# Patient Record
Sex: Male | Born: 1970 | Race: Black or African American | Hispanic: No | Marital: Single | Smoking: Never smoker
Health system: Southern US, Community
[De-identification: ages and names within clinical notes are randomized; demographics above are authoritative.]

## PROBLEM LIST (undated history)

## (undated) DIAGNOSIS — T7840XA Allergy, unspecified, initial encounter: Secondary | ICD-10-CM

## (undated) DIAGNOSIS — E785 Hyperlipidemia, unspecified: Secondary | ICD-10-CM

## (undated) DIAGNOSIS — G709 Myoneural disorder, unspecified: Secondary | ICD-10-CM

## (undated) DIAGNOSIS — E119 Type 2 diabetes mellitus without complications: Secondary | ICD-10-CM

## (undated) DIAGNOSIS — E1142 Type 2 diabetes mellitus with diabetic polyneuropathy: Secondary | ICD-10-CM

## (undated) DIAGNOSIS — I10 Essential (primary) hypertension: Secondary | ICD-10-CM

## (undated) HISTORY — DX: Type 2 diabetes mellitus with diabetic polyneuropathy: E11.42

## (undated) HISTORY — DX: Myoneural disorder, unspecified: G70.9

## (undated) HISTORY — DX: Type 2 diabetes mellitus without complications: E11.9

## (undated) HISTORY — DX: Essential (primary) hypertension: I10

## (undated) HISTORY — DX: Hyperlipidemia, unspecified: E78.5

## (undated) HISTORY — PX: WISDOM TOOTH EXTRACTION: SHX21

## (undated) HISTORY — DX: Allergy, unspecified, initial encounter: T78.40XA

---

## 1998-11-14 ENCOUNTER — Encounter: Admission: RE | Admit: 1998-11-14 | Discharge: 1999-02-12 | Payer: Self-pay

## 1999-06-27 ENCOUNTER — Emergency Department (HOSPITAL_COMMUNITY): Admission: EM | Admit: 1999-06-27 | Discharge: 1999-06-27 | Payer: Self-pay | Admitting: Emergency Medicine

## 2002-04-29 ENCOUNTER — Encounter: Admission: RE | Admit: 2002-04-29 | Discharge: 2002-07-28 | Payer: Self-pay | Admitting: Family Medicine

## 2012-10-01 ENCOUNTER — Encounter: Payer: Self-pay | Admitting: Internal Medicine

## 2012-10-01 ENCOUNTER — Ambulatory Visit: Payer: No Typology Code available for payment source | Attending: Family Medicine | Admitting: Internal Medicine

## 2012-10-01 VITALS — BP 135/85 | HR 82 | Temp 98.9°F | Resp 16 | Ht 73.0 in | Wt 305.0 lb

## 2012-10-01 DIAGNOSIS — E785 Hyperlipidemia, unspecified: Secondary | ICD-10-CM

## 2012-10-01 DIAGNOSIS — E119 Type 2 diabetes mellitus without complications: Secondary | ICD-10-CM

## 2012-10-01 DIAGNOSIS — I10 Essential (primary) hypertension: Secondary | ICD-10-CM

## 2012-10-01 LAB — LIPID PANEL
HDL: 31 mg/dL — ABNORMAL LOW (ref >39)
LDL Cholesterol: 75 mg/dL (ref 0–99)
Total CHOL/HDL Ratio: 4 Ratio
Triglycerides: 92 mg/dL (ref <150)
VLDL: 18 mg/dL (ref 0–40)

## 2012-10-01 MED ORDER — METFORMIN HCL 1000 MG PO TABS: 1000.0000 mg | ORAL_TABLET | Freq: Two times a day (BID) | ORAL | Status: DC

## 2012-10-01 MED ORDER — LISINOPRIL-HYDROCHLOROTHIAZIDE 20-12.5 MG PO TABS: 1.0000 | ORAL_TABLET | Freq: Every day | ORAL | Status: DC

## 2012-10-01 MED ORDER — GLIPIZIDE ER 10 MG PO TB24: 10.0000 mg | ORAL_TABLET | Freq: Every day | ORAL | Status: DC

## 2012-10-01 MED ORDER — AMLODIPINE BESYLATE 10 MG PO TABS: 10.0000 mg | ORAL_TABLET | Freq: Every day | ORAL | Status: DC

## 2012-10-01 MED ORDER — SIMVASTATIN 40 MG PO TABS: 40.0000 mg | ORAL_TABLET | Freq: Every evening | ORAL | Status: DC

## 2012-10-01 NOTE — Patient Instructions (Signed)
Hypertension As your heart beats, it forces blood through your arteries. This force is your blood pressure. If the pressure is too high, it is called hypertension (HTN) or high blood pressure. HTN is dangerous because you may have it and not know it. High blood pressure may mean that your heart has to work harder to pump blood. Your arteries may be narrow or stiff. The extra work puts you at risk for heart disease, stroke, and other problems.  Blood pressure consists of two numbers, a higher number over a lower, 110/72, for example. It is stated as "110 over 72." The ideal is below 120 for the top number (systolic) and under 80 for the bottom (diastolic). Write down your blood pressure today. You should pay close attention to your blood pressure if you have certain conditions such as:  Heart failure.  Prior heart attack.  Diabetes  Chronic kidney disease.  Prior stroke.  Multiple risk factors for heart disease. To see if you have HTN, your blood pressure should be measured while you are seated with your arm held at the level of the heart. It should be measured at least twice. A one-time elevated blood pressure reading (especially in the Emergency Department) does not mean that you need treatment. There may be conditions in which the blood pressure is different between your right and left arms. It is important to see your caregiver soon for a recheck. Most people have essential hypertension which means that there is not a specific cause. This type of high blood pressure may be lowered by changing lifestyle factors such as:  Stress.  Smoking.  Lack of exercise.  Excessive weight.  Drug/tobacco/alcohol use.  Eating less salt. Most people do not have symptoms from high blood pressure until it has caused damage to the body. Effective treatment can often prevent, delay or reduce that damage. TREATMENT  When a cause has been identified, treatment for high blood pressure is directed at the  cause. There are a large number of medications to treat HTN. These fall into several categories, and your caregiver will help you select the medicines that are best for you. Medications may have side effects. You should review side effects with your caregiver. If your blood pressure stays high after you have made lifestyle changes or started on medicines,   Your medication(s) may need to be changed.  Other problems may need to be addressed.  Be certain you understand your prescriptions, and know how and when to take your medicine.  Be sure to follow up with your caregiver within the time frame advised (usually within two weeks) to have your blood pressure rechecked and to review your medications.  If you are taking more than one medicine to lower your blood pressure, make sure you know how and at what times they should be taken. Taking two medicines at the same time can result in blood pressure that is too low. SEEK IMMEDIATE MEDICAL CARE IF:  You develop a severe headache, blurred or changing vision, or confusion.  You have unusual weakness or numbness, or a faint feeling.  You have severe chest or abdominal pain, vomiting, or breathing problems. MAKE SURE YOU:   Understand these instructions.  Will watch your condition.  Will get help right away if you are not doing well or get worse. Document Released: 02/05/2005 Document Revised: 04/30/2011 Document Reviewed: 09/26/2007 [facility] Patient Information 2014 [place], [place].  Diabetes and Exercise Regular exercise is important and can help:   Control blood glucose (  sugar).  Decrease blood pressure.    Control blood lipids (cholesterol, triglycerides).  Improve overall health. BENEFITS FROM EXERCISE  Improved fitness.  Improved flexibility.  Improved endurance.  Increased bone density.  Weight control.  Increased muscle strength.  Decreased body fat.  Improvement of the body's use of insulin, a  hormone.  Increased insulin sensitivity.  Reduction of insulin needs.  Reduced stress and tension.  Helps you feel better. People with diabetes who add exercise to their lifestyle gain additional benefits, including:  Weight loss.  Reduced appetite.  Improvement of the body's use of blood glucose.  Decreased risk factors for heart disease:  Lowering of cholesterol and triglycerides.  Raising the level of good cholesterol (high-density lipoproteins, HDL).  Lowering blood sugar.  Decreased blood pressure. TYPE 1 DIABETES AND EXERCISE  Exercise will usually lower your blood glucose.  If blood glucose is greater than 240 mg/dl, check urine ketones. If ketones are present, do not exercise.  Location of the insulin injection sites may need to be adjusted with exercise. Avoid injecting insulin into areas of the body that will be exercised. For example, avoid injecting insulin into:  The arms when playing tennis.  The legs when jogging. For more information, discuss this with your caregiver.  Keep a record of:  Food intake.  Type and amount of exercise.  Expected peak times of insulin action.  Blood glucose levels. Do this before, during, and after exercise. Review your records with your caregiver. This will help you to develop guidelines for adjusting food intake and insulin amounts.  TYPE 2 DIABETES AND EXERCISE  Regular physical activity can help control blood glucose.  Exercise is important because it may:  Increase the body's sensitivity to insulin.  Improve blood glucose control.  Exercise reduces the risk of heart disease. It decreases serum cholesterol and triglycerides. It also lowers blood pressure.  Those who take insulin or oral hypoglycemic agents should watch for signs of hypoglycemia. These signs include dizziness, shaking, sweating, chills, and confusion.  Body water is lost during exercise. It must be replaced. This will help to avoid loss of  body fluids (dehydration) or heat stroke. Be sure to talk to your caregiver before starting an exercise program to make sure it is safe for you. Remember, any activity is better than none.  Document Released: 04/28/2003 Document Revised: 04/30/2011 Document Reviewed: 08/12/2008 [facility] Patient Information 2014 [place], [place].  Hypercholesterolemia High Blood Cholesterol Cholesterol is a white, waxy, fat-like protein needed by your body in small amounts. The liver makes all the cholesterol you need. It is carried from the liver by the blood through the blood vessels. Deposits (plaque) may build up on blood vessel walls. This makes the arteries narrower and stiffer. Plaque increases the risk for heart attack and stroke. You cannot feel your cholesterol level even if it is very high. The only way to know is by a blood test to check your lipid (fats) levels. Once you know your cholesterol levels, you should keep a record of the test results. Work with your caregiver to to keep your levels in the desired range. WHAT THE RESULTS MEAN:  Total cholesterol is a rough measure of all the cholesterol in your blood.  LDL is the so-called bad cholesterol. This is the type that deposits cholesterol in the walls of the arteries. You want this level to be low.  HDL is the good cholesterol because it cleans the arteries and carries the LDL away. You want this level to  be high.  Triglycerides are fat that the body can either burn for energy or store. High levels are closely linked to heart disease. DESIRED LEVELS:  Total cholesterol below 200.  LDL below 100 for people at risk, below 70 for very high risk.  HDL above 50 is good, above 60 is best.  Triglycerides below 150. HOW TO LOWER YOUR CHOLESTEROL:  Diet.  Choose fish or white meat chicken and [place], roasted or baked. Limit fatty cuts of red meat, fried foods, and processed meats, such as sausage and lunch meat.  Eat lots of fresh fruits and  vegetables. Choose whole grains, beans, pasta, potatoes and cereals.  Use only small amounts of olive, corn or canola oils. Avoid butter, mayonnaise, shortening or palm kernel oils. Avoid foods with trans-fats.  Use skim/nonfat milk and low-fat/nonfat yogurt and cheeses. Avoid whole milk, cream, ice cream, egg yolks and cheeses. Healthy desserts include angel food cake, gingersnaps, animal crackers, hard candy, popsicles, and low-fat/nonfat frozen yogurt. Avoid pastries, cakes, pies and cookies.  Exercise.  A regular program helps decrease LDL and raises HDL.  Helps with weight control.  Do things that increase your activity level like gardening, walking, or taking the stairs.  Medication.  May be prescribed by your caregiver to help lowering cholesterol and the risk for heart disease.  You may need medicine even if your levels are normal if you have several risk factors. HOME CARE INSTRUCTIONS   Follow your diet and exercise programs as suggested by your caregiver.  Take medications as directed.  Have blood work done when your caregiver feels it is necessary. MAKE SURE YOU:   Understand these instructions.  Will watch your condition.  Will get help right away if you are not doing well or get worse. Document Released: 02/05/2005 Document Revised: 04/30/2011 Document Reviewed: 07/24/2006 [facility] Patient Information 2014 [place], [place].

## 2012-10-01 NOTE — Progress Notes (Signed)
Patient ID: Anthony Vincent, male   DOB: 22-Jun-1970, 42 y.o.   MRN: [id number]  CC: To establish care  HPI: Patient is a 42 years old [place] American man here to establish care. He has no specific complaint today. He has history of diabetes mellitus, hypertension and dyslipidemia. He is on amlodipine, lisinopril-hydrochlorothiazide, metformin, glipizide and simvastatin. He is here for refill of his medications. He denies any chest, no headache. No visual impairment. He has not had his vision checked for a long time, no foot examination done. He does not smoke cigarette, and denied the use of alcohol.  No Known Allergies Past Medical History  Diagnosis Date  . Diabetes mellitus without complication   . Hyperlipidemia   . Hypertension    No current outpatient prescriptions on file prior to visit.   No current facility-administered medications on file prior to visit.   Family History  Problem Relation Age of Onset  . Diabetes Mother   . Diabetes Father    History   Social History  . Marital Status: Single    Spouse Name: N/A    Number of Children: N/A  . Years of Education: N/A   Occupational History  . Not on file.   Social History Main Topics  . Smoking status: Never Smoker   . Smokeless tobacco: Not on file  . Alcohol Use: No  . Drug Use: No  . Sexual Activity: Not on file   Other Topics Concern  . Not on file   Social History Narrative  . No narrative on file    Review of Systems: Constitutional: Negative for fever, chills, diaphoresis, activity change, appetite change and fatigue. HENT: Negative for ear pain, nosebleeds, congestion, facial swelling, rhinorrhea, neck pain, neck stiffness and ear discharge.  Eyes: Negative for pain, discharge, redness, itching and visual disturbance. Respiratory: Negative for cough, choking, chest tightness, shortness of breath, wheezing and stridor.  Cardiovascular: Negative for chest pain, palpitations and leg  swelling. Gastrointestinal: Negative for abdominal distention. Genitourinary: Negative for dysuria, urgency, frequency, hematuria, flank pain, decreased urine volume, difficulty urinating and dyspareunia.  Musculoskeletal: Negative for back pain, joint swelling, arthralgias and gait problem. Neurological: Negative for dizziness, tremors, seizures, syncope, facial asymmetry, speech difficulty, weakness, light-headedness, numbness and headaches.  Hematological: Negative for adenopathy. Does not bruise/bleed easily. Psychiatric/Behavioral: Negative for hallucinations, behavioral problems, confusion, dysphoric mood, decreased concentration and agitation.    Objective:   Filed Vitals:   10/01/12 1534  BP: 135/85  Pulse: 82  Temp: 98.9 F (37.2 C)  Resp: 16    Physical Exam: Constitutional: Patient appears well-developed and well-nourished. No distress. obese HENT: Normocephalic, atraumatic, External right and left ear normal. Oropharynx is clear and moist.  Eyes: Conjunctivae and EOM are normal. PERRLA, no scleral icterus. Neck: Normal ROM. Neck supple. No JVD. No tracheal deviation. No thyromegaly. CVS: RRR, S1/S2 +, no murmurs, no gallops, no carotid bruit.  Pulmonary: Effort and breath sounds normal, no stridor, rhonchi, wheezes, rales.  Abdominal: Soft. BS +,  no distension, tenderness, rebound or guarding.  Musculoskeletal: Normal range of motion. No edema and no tenderness.  Lymphadenopathy: No lymphadenopathy noted, cervical, inguinal or axillary Neuro: Alert. Normal reflexes, muscle tone coordination. No cranial nerve deficit. Skin: Skin is warm and dry. No rash noted. Not diaphoretic. No erythema. No pallor. Psychiatric: Normal mood and affect. Behavior, judgment, thought content normal.  No results found for this basename: WBC, HGB, HCT, MCV, PLT   No results found for this basename: CREATININE, BUN, NA,  K, CL, CO2    No results found for this basename: HGBA1C   Lipid  Panel  No results found for this basename: chol, trig, hdl, cholhdl, vldl, ldlcalc       Assessment and plan:   Patient Active Problem List   Diagnosis Date Noted  . Essential hypertension, benign 10/01/2012  . Diabetes 10/01/2012  . Dyslipidemia 10/01/2012    Amlodipine 10 mg tablet by mouth daily  Lisinopril-hydrochlorothiazide 10-12.5 mg tablet by mouth daily  Metformin 1000 mg tablet by mouth twice a day  Glipizide 10 mg tablet by mouth daily  Simvastatin 40 mg tablet by mouth daily  Patient has been counseled about nutrition and exercise  Labs today: Comprehensive metabolic panel Lipid profile CBC D. Hemoglobin A1c  Will follow lab tests and call patient with results  He will be referred to ophthalmologist for eye check And podiatrist referral for foot examination  Patient extensively counseled about blood pressure control and blood sugar control and the need for compliance with medication and followup.  Delvonte Berenson was given clear instructions to go to ER or return to the clinic if symptoms don't improve, worsen or new problems develop.  Kele Rentfrow verbalized understanding.  Cruze Zingaro was told to call to get lab results if hasn't heard anything in the next week.        Jeanann Lewandowsky, MD [facility] [place], [place] [phone]   10/01/2012, 4:09 PM

## 2012-10-01 NOTE — Progress Notes (Signed)
Patient states that he is here to establish care for treatment of diabetes and hypertension. Needs refills.

## 2012-10-02 LAB — CBC WITH DIFFERENTIAL/PLATELET
Basophils Relative: 0 % (ref 0–1)
Eosinophils Absolute: 0.1 10*3/uL (ref 0.0–0.7)
Eosinophils Relative: 2 % (ref 0–5)
Hemoglobin: 14.4 g/dL (ref 13.0–17.0)
MCH: 26.5 pg (ref 26.0–34.0)
MCHC: 34.4 g/dL (ref 30.0–36.0)
MCV: 77 fL — ABNORMAL LOW (ref 78.0–100.0)
Monocytes Relative: 7 % (ref 3–12)
Neutrophils Relative %: 58 % (ref 43–77)

## 2012-10-02 LAB — COMPLETE METABOLIC PANEL WITH GFR
ALT: 40 U/L (ref 0–53)
BUN: 9 mg/dL (ref 6–23)
CO2: 29 mEq/L (ref 19–32)
Creat: 0.95 mg/dL (ref 0.50–1.35)
GFR, Est African American: >89 mL/min
GFR, Est Non African American: >89 mL/min
Glucose, Bld: 248 mg/dL — ABNORMAL HIGH (ref 70–99)
Total Bilirubin: 1 mg/dL (ref 0.3–1.2)

## 2012-10-02 LAB — HEMOGLOBIN A1C: Hgb A1c MFr Bld: 9.4 % — ABNORMAL HIGH (ref <5.7)

## 2012-10-29 ENCOUNTER — Ambulatory Visit: Payer: No Typology Code available for payment source | Attending: Internal Medicine | Admitting: Internal Medicine

## 2012-10-29 ENCOUNTER — Encounter: Payer: Self-pay | Admitting: Internal Medicine

## 2012-10-29 VITALS — BP 128/84 | HR 93 | Temp 98.5°F | Resp 16 | Ht 73.23 in | Wt 302.0 lb

## 2012-10-29 DIAGNOSIS — E119 Type 2 diabetes mellitus without complications: Secondary | ICD-10-CM | POA: Insufficient documentation

## 2012-10-29 DIAGNOSIS — E785 Hyperlipidemia, unspecified: Secondary | ICD-10-CM | POA: Insufficient documentation

## 2012-10-29 DIAGNOSIS — I10 Essential (primary) hypertension: Secondary | ICD-10-CM | POA: Insufficient documentation

## 2012-10-29 NOTE — Patient Instructions (Signed)

## 2012-10-29 NOTE — Progress Notes (Signed)
F/U visit Pt is here for a physical

## 2012-10-29 NOTE — Progress Notes (Signed)
Patient ID: Anthony Vincent, male   DOB: 01-05-1971, 42 y.o.   MRN: [id number]  CC: Followup  HPI: 42 year old male with past medical history of diabetes, dyslipidemia and hypertension who presented to clinic for followup. Patient reports being compliant with medications. No complaints of chest pain or abdominal pain. No fevers or chills. No blurry vision or headaches.  No Known Allergies Past Medical History  Diagnosis Date  . Diabetes mellitus without complication   . Hyperlipidemia   . Hypertension    Current Outpatient Prescriptions on File Prior to Visit  Medication Sig Dispense Refill  . amLODipine (NORVASC) 10 MG tablet Take 1 tablet (10 mg total) by mouth daily.  30 tablet  2  . glipiZIDE (GLUCOTROL XL) 10 MG 24 hr tablet Take 1 tablet (10 mg total) by mouth daily.  30 tablet  2  . lisinopril-hydrochlorothiazide (PRINZIDE,ZESTORETIC) 20-12.5 MG per tablet Take 1 tablet by mouth daily.  30 tablet  2  . metFORMIN (GLUCOPHAGE) 1000 MG tablet Take 1 tablet (1,000 mg total) by mouth 2 (two) times daily with a meal.  60 tablet  2  . simvastatin (ZOCOR) 40 MG tablet Take 1 tablet (40 mg total) by mouth every evening.  30 tablet  2   No current facility-administered medications on file prior to visit.   Family History  Problem Relation Age of Onset  . Diabetes Mother   . Diabetes Father    History   Social History  . Marital Status: Single    Spouse Name: N/A    Number of Children: N/A  . Years of Education: N/A   Occupational History  . Not on file.   Social History Main Topics  . Smoking status: Never Smoker   . Smokeless tobacco: Not on file  . Alcohol Use: No  . Drug Use: No  . Sexual Activity: Not on file   Other Topics Concern  . Not on file   Social History Narrative  . No narrative on file    Review of Systems  Constitutional: Negative for fever, chills, diaphoresis, activity change, appetite change and fatigue.  HENT: Negative for ear pain, nosebleeds,  congestion, facial swelling, rhinorrhea, neck pain, neck stiffness and ear discharge.   Eyes: Negative for pain, discharge, redness, itching and visual disturbance.  Respiratory: Negative for cough, choking, chest tightness, shortness of breath, wheezing and stridor.   Cardiovascular: Negative for chest pain, palpitations and leg swelling.  Gastrointestinal: Negative for abdominal distention.  Genitourinary: Negative for dysuria, urgency, frequency, hematuria, flank pain, decreased urine volume, difficulty urinating and dyspareunia.  Musculoskeletal: Negative for back pain, joint swelling, arthralgias and gait problem.  Neurological: Negative for dizziness, tremors, seizures, syncope, facial asymmetry, speech difficulty, weakness, light-headedness, numbness and headaches.  Hematological: Negative for adenopathy. Does not bruise/bleed easily.  Psychiatric/Behavioral: Negative for hallucinations, behavioral problems, confusion, dysphoric mood, decreased concentration and agitation.    Objective:   Filed Vitals:   10/29/12 1413  BP: 128/84  Pulse: 93  Temp: 98.5 F (36.9 C)  Resp: 16    Physical Exam  Constitutional: Appears well-developed and well-nourished. No distress.  HENT: Normocephalic. External right and left ear normal. Oropharynx is clear and moist.  Eyes: Conjunctivae and EOM are normal. PERRLA, no scleral icterus.  Neck: Normal ROM. Neck supple. No JVD. No tracheal deviation. No thyromegaly.  CVS: RRR, S1/S2 +, no murmurs, no gallops, no carotid bruit.  Pulmonary: Effort and breath sounds normal, no stridor, rhonchi, wheezes, rales.  Abdominal: Soft. BS +,  no distension, tenderness, rebound or guarding.  Musculoskeletal: Normal range of motion. No edema and no tenderness.  Lymphadenopathy: No lymphadenopathy noted, cervical, inguinal. Neuro: Alert. Normal reflexes, muscle tone coordination. No cranial nerve deficit. Skin: Skin is warm and dry. No rash noted. Not  diaphoretic. No erythema. No pallor.  Psychiatric: Normal mood and affect. Behavior, judgment, thought content normal.   Lab Results  Component Value Date   WBC 7.7 10/01/2012   HGB 14.4 10/01/2012   HCT 41.8 10/01/2012   MCV 77.0* 10/01/2012   PLT 230 10/01/2012   Lab Results  Component Value Date   CREATININE 0.95 10/01/2012   BUN 9 10/01/2012   NA 137 10/01/2012   K 4.2 10/01/2012   CL 101 10/01/2012   CO2 29 10/01/2012    Lab Results  Component Value Date   HGBA1C 9.4* 10/01/2012   Lipid Panel     Component Value Date/Time   CHOL 124 10/01/2012 1612   TRIG 92 10/01/2012 1612   HDL 31* 10/01/2012 1612   CHOLHDL 4.0 10/01/2012 1612   VLDL 18 10/01/2012 1612   LDLCALC 75 10/01/2012 1612       Assessment and plan:   Patient Active Problem List   Diagnosis Date Noted  . Essential hypertension, benign 10/01/2012    Priority: Medium - We have discussed target BP range - I have advised pt to check BP regularly and to call us back if the numbers are higher than 140/90 - discussed the importance of compliance with medical therapy and diet  - Blood pressure well-controlled this visit  - Continue Norvasc 10 mg daily and continue Prinzide daily   . Diabetes 10/01/2012    Priority: Medium - Uncontrolled, A1c in August 2014 at 9.4 indicating poor glycemic control  - Continue metformin and glipizide as prescribed and we will recheck A1c in November 2014   . Dyslipidemia 10/01/2012    Priority: Medium - Continue simvastatin 40 mg at bedtime

## 2012-11-19 ENCOUNTER — Encounter (HOSPITAL_COMMUNITY): Payer: Self-pay

## 2012-11-19 ENCOUNTER — Emergency Department (HOSPITAL_COMMUNITY)
Admission: EM | Admit: 2012-11-19 | Discharge: 2012-11-19 | Disposition: A | Discharge status: Home / Self Care | Payer: No Typology Code available for payment source | Source: Home / Self Care | Attending: Family Medicine | Admitting: Family Medicine

## 2012-11-19 DIAGNOSIS — L0291 Cutaneous abscess, unspecified: Secondary | ICD-10-CM

## 2012-11-19 MED ORDER — DOXYCYCLINE HYCLATE 100 MG PO CAPS: 100.0000 mg | ORAL_CAPSULE | Freq: Two times a day (BID) | ORAL | Status: DC

## 2012-11-19 MED ORDER — CEFTRIAXONE SODIUM 1 G IJ SOLR
INTRAMUSCULAR | Status: AC
  Filled 2012-11-19: qty 10

## 2012-11-19 MED ORDER — METHYLPREDNISOLONE SODIUM SUCC 125 MG IJ SOLR
INTRAMUSCULAR | Status: AC
  Filled 2012-11-19: qty 2

## 2012-11-19 MED ORDER — CEFTRIAXONE SODIUM 1 G IJ SOLR
1.0000 g | INTRAMUSCULAR | Status: AC
  Administered 2012-11-19: 1 g via INTRAMUSCULAR

## 2012-11-19 MED ORDER — METHYLPREDNISOLONE SODIUM SUCC 125 MG IJ SOLR
60.0000 mg | Freq: Once | INTRAMUSCULAR | Status: AC
  Administered 2012-11-19: 60 mg via INTRAMUSCULAR

## 2012-11-19 MED ORDER — LIDOCAINE HCL (PF) 1 % IJ SOLN
INTRAMUSCULAR | Status: AC
  Filled 2012-11-19: qty 5

## 2012-11-19 NOTE — ED Provider Notes (Signed)
CSN: [id number]     Arrival date & time 11/19/12  1733 History   First MD Initiated Contact with Patient 11/19/12 1855     Chief Complaint  Patient presents with  . Recurrent Skin Infections   (Consider location/radiation/quality/duration/timing/severity/associated sxs/prior Treatment) HPI Comments: 42 yo male diabetic presents with sore on left cheek since Saturday and sore on chest since Sunday. Patient notes has had same type of sore in the past and resolves with antibiotics. Notes mild throat irritation/ nodes feel swollen x 1 day. Has been cleaning area with alcohol and coating with neosporin. Pt does not check BS regularly. Denies SOB/ CP/ vision changes.   Past Medical History  Diagnosis Date  . Diabetes mellitus without complication   . Hyperlipidemia   . Hypertension    History reviewed. No pertinent past surgical history. Family History  Problem Relation Age of Onset  . Diabetes Mother   . Diabetes Father    History  Substance Use Topics  . Smoking status: Never Smoker   . Smokeless tobacco: Not on file  . Alcohol Use: No    Review of Systems  Constitutional: Negative.   HENT: Positive for sore throat and facial swelling.   Eyes: Negative.   Respiratory: Negative.   Cardiovascular: Negative.   Musculoskeletal: Negative.   Skin: Positive for rash.  Neurological: Negative.   Psychiatric/Behavioral: Negative.     Allergies  Review of patient's allergies indicates no known allergies.  Home Medications   Current Outpatient Rx  Name  Route  Sig  Dispense  Refill  . amLODipine (NORVASC) 10 MG tablet   Oral   Take 1 tablet (10 mg total) by mouth daily.   30 tablet   2   . doxycycline (VIBRAMYCIN) 100 MG capsule   Oral   Take 1 capsule (100 mg total) by mouth 2 (two) times daily.   20 capsule   0   . glipiZIDE (GLUCOTROL XL) 10 MG 24 hr tablet   Oral   Take 1 tablet (10 mg total) by mouth daily.   30 tablet   2   . lisinopril-hydrochlorothiazide  (PRINZIDE,ZESTORETIC) 20-12.5 MG per tablet   Oral   Take 1 tablet by mouth daily.   30 tablet   2   . metFORMIN (GLUCOPHAGE) 1000 MG tablet   Oral   Take 1 tablet (1,000 mg total) by mouth 2 (two) times daily with a meal.   60 tablet   2   . simvastatin (ZOCOR) 40 MG tablet   Oral   Take 1 tablet (40 mg total) by mouth every evening.   30 tablet   2    BP 127/79  Pulse 104  Temp(Src) 99.3 F (37.4 C) (Oral)  Resp 19  SpO2 97% Physical Exam  Nursing note and vitals reviewed. Constitutional: He is oriented to person, place, and time. He appears well-developed and well-nourished.  HENT:  Head: Normocephalic and atraumatic.  Right Ear: External ear normal.  Left Ear: External ear normal.  Nose: Nose normal.  Mouth/Throat: Oropharynx is clear and moist.  Eyes: Conjunctivae and EOM are normal. Pupils are equal, round, and reactive to light.  Neck: Neck supple.  Cardiovascular: Normal rate, regular rhythm, normal heart sounds and intact distal pulses.   Pulmonary/Chest: Effort normal and breath sounds normal.  Musculoskeletal: Normal range of motion.  Neurological: He is alert and oriented to person, place, and time. He has normal reflexes.  Skin: Skin is warm.  Left cheek with quarter size erythema  with central 1-2 mm yellow/ white pustules, with mild fullness Center chest with quarter size erythema with multiple 1-2 mm pustules centrally with area of fullness approximately 1cm wider on all edges of erythema  Psychiatric: He has a normal mood and affect. Judgment normal.    ED Course  Procedures (including critical care time) Labs Review Labs Reviewed - No data to display Imaging Review No results found.  MDM  Abscess chest and face. Solumedrol 60mg  IM Rocephin 1 gm Doxycycline 100mg  1 bid # 20 Hygiene explained to patient. Patient will go to ER if symptoms increase. Patient will take extra 1/2 of Glipizide if BS above 200    Berenice Primas, [place] 11/19/12  1922

## 2012-11-19 NOTE — ED Notes (Signed)
C/o has an inflamed area on face and on his chest for past few days; reportedly affects his swallowing 2nd to swelling

## 2012-11-19 NOTE — ED Notes (Signed)
rocepin mixed in lidocaine for comfort

## 2012-11-21 NOTE — ED Provider Notes (Signed)
Medical screening examination/treatment/procedure(s) were performed by resident physician or non-physician practitioner and as supervising physician I was immediately available for consultation/collaboration.   Luan Urbani DOUGLAS MD.   Ismael Treptow D Morgen Ritacco, MD 11/21/12 0912 

## 2013-01-07 ENCOUNTER — Ambulatory Visit: Payer: No Typology Code available for payment source | Admitting: Internal Medicine

## 2013-02-02 ENCOUNTER — Encounter: Payer: Self-pay | Admitting: Internal Medicine

## 2013-02-02 ENCOUNTER — Ambulatory Visit: Payer: No Typology Code available for payment source | Attending: Internal Medicine | Admitting: Internal Medicine

## 2013-02-02 VITALS — BP 141/82 | HR 102 | Temp 98.7°F | Resp 15 | Ht 72.0 in | Wt 302.8 lb

## 2013-02-02 DIAGNOSIS — E119 Type 2 diabetes mellitus without complications: Secondary | ICD-10-CM | POA: Insufficient documentation

## 2013-02-02 DIAGNOSIS — E1310 Other specified diabetes mellitus with ketoacidosis without coma: Secondary | ICD-10-CM

## 2013-02-02 DIAGNOSIS — E1110 Type 2 diabetes mellitus with ketoacidosis without coma: Secondary | ICD-10-CM

## 2013-02-02 DIAGNOSIS — I10 Essential (primary) hypertension: Secondary | ICD-10-CM | POA: Insufficient documentation

## 2013-02-02 DIAGNOSIS — E785 Hyperlipidemia, unspecified: Secondary | ICD-10-CM

## 2013-02-02 LAB — COMPLETE METABOLIC PANEL WITH GFR
Alkaline Phosphatase: 81 U/L (ref 39–117)
BUN: 11 mg/dL (ref 6–23)
CO2: 27 mEq/L (ref 19–32)
GFR, Est African American: >89 mL/min
GFR, Est Non African American: 83 mL/min
Glucose, Bld: 406 mg/dL — ABNORMAL HIGH (ref 70–99)
Sodium: 136 mEq/L (ref 135–145)
Total Bilirubin: 1.1 mg/dL (ref 0.3–1.2)
Total Protein: 7.3 g/dL (ref 6.0–8.3)

## 2013-02-02 LAB — GLUCOSE, POCT (MANUAL RESULT ENTRY): POC Glucose: 380 mg/dl — AB (ref 70–99)

## 2013-02-02 LAB — CBC WITH DIFFERENTIAL/PLATELET
Basophils Absolute: 0 10*3/uL (ref 0.0–0.1)
Eosinophils Absolute: 0.1 10*3/uL (ref 0.0–0.7)
Eosinophils Relative: 2 % (ref 0–5)
HCT: 45.1 % (ref 39.0–52.0)
Hemoglobin: 15.5 g/dL (ref 13.0–17.0)
Lymphs Abs: 2.1 10*3/uL (ref 0.7–4.0)
MCH: 27.2 pg (ref 26.0–34.0)
MCV: 79.1 fL (ref 78.0–100.0)
Monocytes Absolute: 0.6 10*3/uL (ref 0.1–1.0)
Monocytes Relative: 8 % (ref 3–12)
Neutro Abs: 5 10*3/uL (ref 1.7–7.7)
Neutrophils Relative %: 63 % (ref 43–77)
RBC: 5.7 MIL/uL (ref 4.22–5.81)

## 2013-02-02 LAB — LIPID PANEL
Cholesterol: 138 mg/dL (ref 0–200)
HDL: 28 mg/dL — ABNORMAL LOW (ref >39)
LDL Cholesterol: 85 mg/dL (ref 0–99)

## 2013-02-02 MED ORDER — AMLODIPINE BESYLATE 10 MG PO TABS: 10.0000 mg | ORAL_TABLET | Freq: Every day | ORAL | Status: DC

## 2013-02-02 MED ORDER — METFORMIN HCL 1000 MG PO TABS: 1000.0000 mg | ORAL_TABLET | Freq: Two times a day (BID) | ORAL | Status: DC

## 2013-02-02 MED ORDER — SIMVASTATIN 40 MG PO TABS: 40.0000 mg | ORAL_TABLET | Freq: Every evening | ORAL | Status: DC

## 2013-02-02 MED ORDER — LISINOPRIL-HYDROCHLOROTHIAZIDE 20-12.5 MG PO TABS: 1.0000 | ORAL_TABLET | Freq: Every day | ORAL | Status: DC

## 2013-02-02 MED ORDER — GLIPIZIDE ER 10 MG PO TB24: 10.0000 mg | ORAL_TABLET | Freq: Every day | ORAL | Status: DC

## 2013-02-02 NOTE — Addendum Note (Signed)
Addended by: Susie Cassette MD, Germain Osgood on: 02/02/2013 03:33 PM   Modules accepted: Orders

## 2013-02-02 NOTE — Progress Notes (Signed)
Pt is here for a diabetes check up.

## 2013-02-02 NOTE — Progress Notes (Signed)
Patient ID: Anthony Vincent, male   DOB: January 06, 1971, 42 y.o.   MRN: [id number]   CC:  HPI: 42 year-old male here for diabetes check up. Last hemoglobin A1c was around 9.4. The patient states that her CBGs have been running in the 200s. He denies any recent weight loss. Denies any chest pain shortness of breath orthopnea. He is motivated to lose weight    No Known Allergies Past Medical History  Diagnosis Date  . Diabetes mellitus without complication   . Hyperlipidemia   . Hypertension    Current Outpatient Prescriptions on File Prior to Visit  Medication Sig Dispense Refill  . amLODipine (NORVASC) 10 MG tablet Take 1 tablet (10 mg total) by mouth daily.  30 tablet  2  . doxycycline (VIBRAMYCIN) 100 MG capsule Take 1 capsule (100 mg total) by mouth 2 (two) times daily.  20 capsule  0  . glipiZIDE (GLUCOTROL XL) 10 MG 24 hr tablet Take 1 tablet (10 mg total) by mouth daily.  30 tablet  2  . lisinopril-hydrochlorothiazide (PRINZIDE,ZESTORETIC) 20-12.5 MG per tablet Take 1 tablet by mouth daily.  30 tablet  2  . metFORMIN (GLUCOPHAGE) 1000 MG tablet Take 1 tablet (1,000 mg total) by mouth 2 (two) times daily with a meal.  60 tablet  2  . simvastatin (ZOCOR) 40 MG tablet Take 1 tablet (40 mg total) by mouth every evening.  30 tablet  2   No current facility-administered medications on file prior to visit.   Family History  Problem Relation Age of Onset  . Diabetes Mother   . Diabetes Father    History   Social History  . Marital Status: Single    Spouse Name: N/A    Number of Children: N/A  . Years of Education: N/A   Occupational History  . Not on file.   Social History Main Topics  . Smoking status: Never Smoker   . Smokeless tobacco: Not on file  . Alcohol Use: No  . Drug Use: No  . Sexual Activity: Not on file   Other Topics Concern  . Not on file   Social History Narrative  . No narrative on file    Review of Systems  Constitutional: Negative for fever,  chills, diaphoresis, activity change, appetite change and fatigue.  HENT: Negative for ear pain, nosebleeds, congestion, facial swelling, rhinorrhea, neck pain, neck stiffness and ear discharge.   Eyes: Negative for pain, discharge, redness, itching and visual disturbance.  Respiratory: Negative for cough, choking, chest tightness, shortness of breath, wheezing and stridor.   Cardiovascular: Negative for chest pain, palpitations and leg swelling.  Gastrointestinal: Negative for abdominal distention.  Genitourinary: Negative for dysuria, urgency, frequency, hematuria, flank pain, decreased urine volume, difficulty urinating and dyspareunia.  Musculoskeletal: Negative for back pain, joint swelling, arthralgias and gait problem.  Neurological: Negative for dizziness, tremors, seizures, syncope, facial asymmetry, speech difficulty, weakness, light-headedness, numbness and headaches.  Hematological: Negative for adenopathy. Does not bruise/bleed easily.  Psychiatric/Behavioral: Negative for hallucinations, behavioral problems, confusion, dysphoric mood, decreased concentration and agitation.    Objective:   Filed Vitals:   02/02/13 1456  BP: 141/82  Pulse: 102  Temp: 98.7 F (37.1 C)  Resp: 15    Physical Exam  Constitutional: Appears well-developed and well-nourished. No distress.  HENT: Normocephalic. External right and left ear normal. Oropharynx is clear and moist.  Eyes: Conjunctivae and EOM are normal. PERRLA, no scleral icterus.  Neck: Normal ROM. Neck supple. No JVD. No tracheal deviation.  No thyromegaly.  CVS: RRR, S1/S2 +, no murmurs, no gallops, no carotid bruit.  Pulmonary: Effort and breath sounds normal, no stridor, rhonchi, wheezes, rales.  Abdominal: Soft. BS +,  no distension, tenderness, rebound or guarding.  Musculoskeletal: Normal range of motion. No edema and no tenderness.  Lymphadenopathy: No lymphadenopathy noted, cervical, inguinal. Neuro: Alert. Normal reflexes,  muscle tone coordination. No cranial nerve deficit. Skin: Skin is warm and dry. No rash noted. Not diaphoretic. No erythema. No pallor.  Psychiatric: Normal mood and affect. Behavior, judgment, thought content normal.   Lab Results  Component Value Date   WBC 7.7 10/01/2012   HGB 14.4 10/01/2012   HCT 41.8 10/01/2012   MCV 77.0* 10/01/2012   PLT 230 10/01/2012   Lab Results  Component Value Date   CREATININE 0.95 10/01/2012   BUN 9 10/01/2012   NA 137 10/01/2012   K 4.2 10/01/2012   CL 101 10/01/2012   CO2 29 10/01/2012    Lab Results  Component Value Date   HGBA1C 9.4* 10/01/2012   Lipid Panel     Component Value Date/Time   CHOL 124 10/01/2012 1612   TRIG 92 10/01/2012 1612   HDL 31* 10/01/2012 1612   CHOLHDL 4.0 10/01/2012 1612   VLDL 18 10/01/2012 1612   LDLCALC 75 10/01/2012 1612       Assessment and plan:   Patient Active Problem List   Diagnosis Date Noted  . Essential hypertension, benign 10/01/2012  . Diabetes 10/01/2012  . Dyslipidemia 10/01/2012   Hypertension Control continue current medical regimen We'll check creatinine  Diabetes mellitus type 2, A1c pending Patient has maxed out on oral regimen Offered once a day Lantus at bedtime his A1c was still around 9 Patient refuses to start insulin, wants to lose weight and see what his A1c in about 3 months    Ophthalmology referral provided as you've not had an eye exam in 3 years  Follow up in 2 months    The patient was given clear instructions to go to ER or return to medical center if symptoms don't improve, worsen or new problems develop. The patient verbalized understanding. The patient was told to call to get any lab results if not heard anything in the next week.

## 2013-02-02 NOTE — Addendum Note (Signed)
Addended by: Susie Cassette MD, Germain Osgood on: 02/02/2013 03:51 PM   Modules accepted: Orders

## 2013-02-02 NOTE — Addendum Note (Signed)
Addended by: Susie Cassette MD, Germain Osgood on: 02/02/2013 03:48 PM   Modules accepted: Orders

## 2013-02-16 ENCOUNTER — Telehealth: Payer: Self-pay | Admitting: Emergency Medicine

## 2013-02-16 NOTE — Telephone Encounter (Signed)
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 Pain has worsened

## 2013-03-10 ENCOUNTER — Telehealth: Payer: Self-pay | Admitting: Emergency Medicine

## 2013-03-10 ENCOUNTER — Telehealth: Payer: Self-pay | Admitting: Internal Medicine

## 2013-03-10 MED ORDER — GABAPENTIN 100 MG PO CAPS: 100.0000 mg | ORAL_CAPSULE | Freq: Three times a day (TID) | ORAL | Status: DC

## 2013-03-10 NOTE — Telephone Encounter (Signed)
Spoke with pt regarding script for neuropathy. Script Gabapentin 100 mg tid e-scribed to [facility]. Pt verbalized understanding

## 2013-03-10 NOTE — Telephone Encounter (Signed)
Pt is calling in today to check the status of his appt and to speak with a nurse about some pain in his hands; Pt mentioned that he called a few weeks ago to inquire about some medication to help relieve the pain; pt says that pain is mild but noticeable; please follow up with pt @ [phone]

## 2013-04-06 ENCOUNTER — Ambulatory Visit: Payer: No Typology Code available for payment source | Attending: Internal Medicine | Admitting: Internal Medicine

## 2013-04-06 ENCOUNTER — Encounter: Payer: Self-pay | Admitting: Internal Medicine

## 2013-04-06 VITALS — BP 133/92 | HR 87 | Temp 98.4°F | Resp 14 | Ht 72.0 in | Wt 294.0 lb

## 2013-04-06 DIAGNOSIS — E785 Hyperlipidemia, unspecified: Secondary | ICD-10-CM

## 2013-04-06 DIAGNOSIS — I10 Essential (primary) hypertension: Secondary | ICD-10-CM

## 2013-04-06 DIAGNOSIS — G609 Hereditary and idiopathic neuropathy, unspecified: Secondary | ICD-10-CM

## 2013-04-06 DIAGNOSIS — E119 Type 2 diabetes mellitus without complications: Secondary | ICD-10-CM

## 2013-04-06 MED ORDER — LISINOPRIL-HYDROCHLOROTHIAZIDE 20-12.5 MG PO TABS: 1.0000 | ORAL_TABLET | Freq: Every day | ORAL | Status: DC

## 2013-04-06 MED ORDER — SIMVASTATIN 40 MG PO TABS: 40.0000 mg | ORAL_TABLET | Freq: Every evening | ORAL | Status: DC

## 2013-04-06 MED ORDER — AMLODIPINE BESYLATE 10 MG PO TABS: 10.0000 mg | ORAL_TABLET | Freq: Every day | ORAL | Status: DC

## 2013-04-06 MED ORDER — GABAPENTIN 100 MG PO CAPS: 100.0000 mg | ORAL_CAPSULE | Freq: Three times a day (TID) | ORAL | Status: DC

## 2013-04-06 MED ORDER — GLIPIZIDE ER 10 MG PO TB24: 10.0000 mg | ORAL_TABLET | Freq: Every day | ORAL | Status: DC

## 2013-04-06 MED ORDER — METFORMIN HCL 1000 MG PO TABS: 1000.0000 mg | ORAL_TABLET | Freq: Two times a day (BID) | ORAL | Status: DC

## 2013-04-06 NOTE — Progress Notes (Unsigned)
Patient ID: Anthony Vincent, male   DOB: [birth year], 43 y.o.   MRN: [id number]   CC:  HPI: 43 year old male show the chief complaint of numbness in the dull ache in the second and third digit of both his hands. He denies any neck pain denies any shoulder pain, no pain radiating down his arms. Pain is worse with any use of his hands. Currently he works as a Clinical biochemist. Pain is also worse when he wakes up in the morning. Pain has been present since December last year. Somewhat improved since he started on gabapentin 100 mg 3 times a day.  His diabetes is under good control her CBGs have been from 150-220. His last A1c was greater than 9.0    No Known Allergies Past Medical History  Diagnosis Date  . Diabetes mellitus without complication   . Hyperlipidemia   . Hypertension    Current Outpatient Prescriptions on File Prior to Visit  Medication Sig Dispense Refill  . doxycycline (VIBRAMYCIN) 100 MG capsule Take 1 capsule (100 mg total) by mouth 2 (two) times daily.  20 capsule  0   No current facility-administered medications on file prior to visit.   Family History  Problem Relation Age of Onset  . Diabetes Mother   . Diabetes Father    History   Social History  . Marital Status: Single    Spouse Name: N/A    Number of Children: N/A  . Years of Education: N/A   Occupational History  . Not on file.   Social History Main Topics  . Smoking status: Never Smoker   . Smokeless tobacco: Not on file  . Alcohol Use: No  . Drug Use: No  . Sexual Activity: Not on file   Other Topics Concern  . Not on file   Social History Narrative  . No narrative on file    Review of Systems  Constitutional: Negative for fever, chills, diaphoresis, activity change, appetite change and fatigue.  HENT: Negative for ear pain, nosebleeds, congestion, facial swelling, rhinorrhea, neck pain, neck stiffness and ear discharge.   Eyes: Negative for pain, discharge, redness, itching and visual  disturbance.  Respiratory: Negative for cough, choking, chest tightness, shortness of breath, wheezing and stridor.   Cardiovascular: Negative for chest pain, palpitations and leg swelling.  Gastrointestinal: Negative for abdominal distention.  Genitourinary: Negative for dysuria, urgency, frequency, hematuria, flank pain, decreased urine volume, difficulty urinating and dyspareunia.  Musculoskeletal: As in history of present illness Neurological: Negative for dizziness, tremors, seizures, syncope, facial asymmetry, speech difficulty, weakness, light-headedness, numbness and headaches.  Hematological: Negative for adenopathy. Does not bruise/bleed easily.  Psychiatric/Behavioral: Negative for hallucinations, behavioral problems, confusion, dysphoric mood, decreased concentration and agitation.    Objective:   Filed Vitals:   04/06/13 1332  BP: 133/92  Pulse: 87  Temp: 98.4 F (36.9 C)  Resp: 14    Physical Exam  Constitutional: Appears well-developed and well-nourished. No distress.  HENT: Normocephalic. External right and left ear normal. Oropharynx is clear and moist.  Eyes: Conjunctivae and EOM are normal. PERRLA, no scleral icterus.  Neck: Normal ROM. Neck supple. No JVD. No tracheal deviation. No thyromegaly.  CVS: RRR, S1/S2 +, no murmurs, no gallops, no carotid bruit.  Pulmonary: Effort and breath sounds normal, no stridor, rhonchi, wheezes, rales.  Abdominal: Soft. BS +,  no distension, tenderness, rebound or guarding.  Musculoskeletal: Normal range of motion. No edema and no tenderness.  Lymphadenopathy: No lymphadenopathy noted, cervical, inguinal. Neuro: Alert. Normal reflexes,  muscle tone coordination. No cranial nerve deficit. Skin: Skin is warm and dry. No rash noted. Not diaphoretic. No erythema. No pallor.  Psychiatric: Normal mood and affect. Behavior, judgment, thought content normal.   Lab Results  Component Value Date   WBC 7.9 02/02/2013   HGB 15.5  02/02/2013   HCT 45.1 02/02/2013   MCV 79.1 02/02/2013   PLT 268 02/02/2013   Lab Results  Component Value Date   CREATININE 1.08 02/02/2013   BUN 11 02/02/2013   NA 136 02/02/2013   K 4.4 02/02/2013   CL 101 02/02/2013   CO2 27 02/02/2013    Lab Results  Component Value Date   HGBA1C 9.9 02/02/2013   Lipid Panel     Component Value Date/Time   CHOL 138 02/02/2013 1513   TRIG 124 02/02/2013 1513   HDL 28* 02/02/2013 1513   CHOLHDL 4.9 02/02/2013 1513   VLDL 25 02/02/2013 1513   LDLCALC 85 02/02/2013 1513       Assessment and plan:   Patient Active Problem List   Diagnosis Date Noted  . Essential hypertension, benign 10/01/2012  . Diabetes 10/01/2012  . Dyslipidemia 10/01/2012    Hand pain Most likely the patient has bilateral carpal tunnel syndrome However the distribution is somewhat atypical We'll refer the patient for nerve conduction study, neurology referral Increase gabapentin to 200 mg at bedtime, 100 mg  in a.m. and afternoon Imaging studies found to be less useful for carpal tunnel   Diabetes Refilled all medications Check A1c and lipid panel during the patient's next visit  Hypertension Controlled Refill Norvasc and lisinopril HCTZ Patient to follow up in 6 weeks      The patient was given clear instructions to go to ER or return to medical center if symptoms don't improve, worsen or new problems develop. The patient verbalized understanding. The patient was told to call to get any lab results if not heard anything in the next week.

## 2013-04-06 NOTE — Progress Notes (Unsigned)
Patient is here to a follow of numbness/tingling in bilateral hands. Symptoms come and go, has some pain and numbness in the fingertips. Symptoms occur mainly in Rt hands; also complains of constant coldness. No pain today. Pain only occurs wile trying to grasps an object. Patient is a diabetic. Glucose this morning of 153. Needs refills on all medications.

## 2013-06-08 ENCOUNTER — Other Ambulatory Visit: Payer: Self-pay | Admitting: Internal Medicine

## 2013-06-08 DIAGNOSIS — E119 Type 2 diabetes mellitus without complications: Secondary | ICD-10-CM

## 2013-07-15 ENCOUNTER — Other Ambulatory Visit: Payer: Self-pay | Admitting: Internal Medicine

## 2013-07-15 DIAGNOSIS — I10 Essential (primary) hypertension: Secondary | ICD-10-CM

## 2013-08-13 ENCOUNTER — Telehealth: Payer: Self-pay | Admitting: Internal Medicine

## 2013-08-13 NOTE — Telephone Encounter (Signed)
Pt has come in today requesting an appointment and medication refill for all medications; pt was made an appointment for 7/23 @ 5:00; please f.u with pt about what he needs to do as far as the medication refill is concerned; pt can be reached at [phone]

## 2013-08-14 ENCOUNTER — Telehealth: Payer: Self-pay | Admitting: Emergency Medicine

## 2013-08-14 NOTE — Telephone Encounter (Signed)
Left message for pt to call when message received 

## 2013-08-18 ENCOUNTER — Ambulatory Visit: Payer: No Typology Code available for payment source | Attending: Internal Medicine | Admitting: Internal Medicine

## 2013-08-18 ENCOUNTER — Other Ambulatory Visit: Payer: Self-pay | Admitting: Internal Medicine

## 2013-08-18 ENCOUNTER — Ambulatory Visit (HOSPITAL_BASED_OUTPATIENT_CLINIC_OR_DEPARTMENT_OTHER): Payer: No Typology Code available for payment source | Admitting: Internal Medicine

## 2013-08-18 ENCOUNTER — Other Ambulatory Visit: Payer: Self-pay | Admitting: Emergency Medicine

## 2013-08-18 DIAGNOSIS — E139 Other specified diabetes mellitus without complications: Secondary | ICD-10-CM

## 2013-08-18 DIAGNOSIS — E089 Diabetes mellitus due to underlying condition without complications: Secondary | ICD-10-CM

## 2013-08-18 DIAGNOSIS — E119 Type 2 diabetes mellitus without complications: Secondary | ICD-10-CM

## 2013-08-18 DIAGNOSIS — E785 Hyperlipidemia, unspecified: Secondary | ICD-10-CM

## 2013-08-18 LAB — CBC WITH DIFFERENTIAL/PLATELET
Basophils Absolute: 0 10*3/uL (ref 0.0–0.1)
Basophils Relative: 0 % (ref 0–1)
Eosinophils Absolute: 0.1 10*3/uL (ref 0.0–0.7)
Eosinophils Relative: 2 % (ref 0–5)
HEMATOCRIT: 43.9 % (ref 39.0–52.0)
Hemoglobin: 16 g/dL (ref 13.0–17.0)
LYMPHS ABS: 2.3 10*3/uL (ref 0.7–4.0)
LYMPHS PCT: 34 % (ref 12–46)
MCH: 27.4 pg (ref 26.0–34.0)
MCHC: 36.4 g/dL — ABNORMAL HIGH (ref 30.0–36.0)
MCV: 75 fL — ABNORMAL LOW (ref 78.0–100.0)
Monocytes Absolute: 0.5 10*3/uL (ref 0.1–1.0)
Monocytes Relative: 7 % (ref 3–12)
NEUTROS ABS: 3.8 10*3/uL (ref 1.7–7.7)
Neutrophils Relative %: 57 % (ref 43–77)
PLATELETS: 236 10*3/uL (ref 150–400)
RBC: 5.85 MIL/uL — AB (ref 4.22–5.81)
RDW: 15.6 % — ABNORMAL HIGH (ref 11.5–15.5)
WBC: 6.7 10*3/uL (ref 4.0–10.5)

## 2013-08-18 LAB — COMPLETE METABOLIC PANEL WITH GFR
ALT: 36 U/L (ref 0–53)
AST: 14 U/L (ref 0–37)
Albumin: 4.7 g/dL (ref 3.5–5.2)
Alkaline Phosphatase: 87 U/L (ref 39–117)
BUN: 9 mg/dL (ref 6–23)
CHLORIDE: 99 meq/L (ref 96–112)
CO2: 25 mEq/L (ref 19–32)
Calcium: 9.7 mg/dL (ref 8.4–10.5)
Creat: 0.9 mg/dL (ref 0.50–1.35)
GFR, Est African American: >89 mL/min
GFR, Est Non African American: >89 mL/min
Glucose, Bld: 334 mg/dL — ABNORMAL HIGH (ref 70–99)
POTASSIUM: 4.2 meq/L (ref 3.5–5.3)
SODIUM: 134 meq/L — AB (ref 135–145)
TOTAL PROTEIN: 7.4 g/dL (ref 6.0–8.3)
Total Bilirubin: 1 mg/dL (ref 0.2–1.2)

## 2013-08-18 LAB — LIPID PANEL
Cholesterol: 173 mg/dL (ref 0–200)
HDL: 28 mg/dL — ABNORMAL LOW (ref >39)
LDL CALC: 127 mg/dL — AB (ref 0–99)
TRIGLYCERIDES: 89 mg/dL (ref <150)
Total CHOL/HDL Ratio: 6.2 Ratio
VLDL: 18 mg/dL (ref 0–40)

## 2013-08-18 LAB — GLUCOSE, POCT (MANUAL RESULT ENTRY)
POC Glucose: 311 mg/dl — AB (ref 70–99)
POC Glucose: 322 mg/dl — AB (ref 70–99)

## 2013-08-18 LAB — POCT GLYCOSYLATED HEMOGLOBIN (HGB A1C): Hemoglobin A1C: 9.7

## 2013-08-18 MED ORDER — INSULIN ASPART 100 UNIT/ML ~~LOC~~ SOLN
10.0000 [IU] | Freq: Once | SUBCUTANEOUS | Status: AC
  Administered 2013-08-18: 10 [IU] via SUBCUTANEOUS

## 2013-08-18 MED ORDER — GLIPIZIDE ER 10 MG PO TB24: 10.0000 mg | ORAL_TABLET | Freq: Every day | ORAL | Status: DC

## 2013-08-18 MED ORDER — METFORMIN HCL 1000 MG PO TABS: 1000.0000 mg | ORAL_TABLET | Freq: Two times a day (BID) | ORAL | Status: DC

## 2013-08-18 MED ORDER — DAPAGLIFLOZIN PROPANEDIOL 10 MG PO TABS: 10.0000 mg | ORAL_TABLET | Freq: Every day | ORAL | Status: DC

## 2013-08-18 NOTE — Progress Notes (Unsigned)
Pt comes in for medications refill on Glipizide and Metformin until seen by provider scheduled 09/10/13 Pt states he ran out of meds 4 dys ago C/o frequent urination Last A1c 01/2013 Medication refilled for 30 day supply with no refills A1c,CBG running

## 2013-08-22 NOTE — Progress Notes (Signed)
Patient ID: Anthony Vincent, male   DOB: 05-16-1970, 43 y.o.   MRN: [id number]   Anthony Vincent, is a 43 y.o. male  KAJ:[id number]  MBT:[id number]  DOB - 04-07-1970  No chief complaint on file.       Subjective:   Anthony Vincent is a 43 y.o. male here today for a follow up visit. Patient is known to have diabetes mellitus uncontrolled, dyslipidemia, and hypertension here today for followup. He was found to have very high blood sugar in the clinic, the patient said her sugar has been consistently in the high 200s despite metformin 1000 mg tablet by mouth twice a day and glipizide 10 mg tablet by mouth daily. He is also lisinopril and hydrochlorothiazide for hypertension and simvastatin for dyslipidemia, his blood pressure is controlled with amlodipine 10 mg tablet by mouth daily. Patient reports no side effects to medications, and he is compliant. He does not smoke cigarettes he does not drink alcohol. Patient has No headache, No chest pain, No abdominal pain - No Nausea, No new weakness tingling or numbness, No Cough - SOB.  No problems updated.  ALLERGIES: No Known Allergies  PAST MEDICAL HISTORY: Past Medical History  Diagnosis Date  . Diabetes mellitus without complication   . Hyperlipidemia   . Hypertension     MEDICATIONS AT HOME: Prior to Admission medications   Medication Sig Start Date End Date Taking? Authorizing Provider  amLODipine (NORVASC) 10 MG tablet TAKE 1 TABLET BY MOUTH DAILY    Angelica Chessman, MD  Dapagliflozin Propanediol (FARXIGA) 10 MG TABS Take 10 mg by mouth daily. 08/18/13   Angelica Chessman, MD  doxycycline (VIBRAMYCIN) 100 MG capsule Take 1 capsule (100 mg total) by mouth 2 (two) times daily. 11/19/12   Melissa R Smith, PA-C  gabapentin (NEURONTIN) 100 MG capsule TAKE ONE(1) CAPSULE BY MOUTH EVERY MORNING, ONE(1) CAPSULE INTHE AFTERNOON AND TAKE TWO(2) CAPSULES AT BEDTIME    Angelica Chessman, MD  glipiZIDE (GLIPIZIDE XL) 10 MG 24 hr tablet Take 1 tablet  (10 mg total) by mouth daily with breakfast. 08/18/13   Angelica Chessman, MD  lisinopril-hydrochlorothiazide (PRINZIDE,ZESTORETIC) 20-12.5 MG per tablet TAKE 1 TABLET BY MOUTH DAILY    Angelica Chessman, MD  metFORMIN (GLUCOPHAGE) 1000 MG tablet Take 1 tablet (1,000 mg total) by mouth 2 (two) times daily with a meal. 08/18/13   Angelica Chessman, MD  simvastatin (ZOCOR) 40 MG tablet TAKE 1 TABLET (40 MG TOTAL) BY MOUTH EVERY EVENING.    Angelica Chessman, MD     Objective:   There were no vitals filed for this visit.  Exam General appearance : Awake, alert, not in any distress. Speech Clear. Not toxic looking HEENT: Atraumatic and Normocephalic, pupils equally reactive to light and accomodation Neck: supple, no JVD. No cervical lymphadenopathy.  Chest:Good air entry bilaterally, no added sounds  CVS: S1 S2 regular, no murmurs.  Abdomen: Bowel sounds present, Non tender and not distended with no gaurding, rigidity or rebound. Extremities: B/L Lower Ext shows no edema, both legs are warm to touch Neurology: Awake alert, and oriented X 3, CN II-XII intact, Non focal Skin:No Rash  Data Review Lab Results  Component Value Date   HGBA1C 9.7 08/18/2013   HGBA1C 9.9 02/02/2013   HGBA1C 9.4* 10/01/2012     Assessment & Plan   1. Diabetes mellitus due to underlying condition without complications Blood sugar is uncontrolled even though the hemoglobin A1c came to my 0.2%, had another oral hypoglycemic agent  - Dapagliflozin Propanediol (FARXIGA)  10 MG TABS; Take 10 mg by mouth daily.  Dispense: 30 tablet; Refill: 3  - CBC with Differential - COMPLETE METABOLIC PANEL WITH GFR - Lipid panel  Diabetic diet, DASH diet Patient was counseled extensively on nutrition and exercise  Return in about 4 weeks (around 09/15/2013), or if symptoms worsen or fail to improve, for Follow up HTN, Hemoglobin A1C and Follow up, DM.  The patient was given clear instructions to go to ER or return to medical  center if symptoms don't improve, worsen or new problems develop. The patient verbalized understanding. The patient was told to call to get lab results if they haven't heard anything in the next week.   This note has been created with Surveyor, quantity. Any transcriptional errors are unintentional.    Angelica Chessman, MD, [place], [place], [place] and [place], [place]   08/22/2013, 10:06 PM

## 2013-08-25 ENCOUNTER — Ambulatory Visit: Payer: No Typology Code available for payment source | Attending: Internal Medicine | Admitting: *Deleted

## 2013-08-25 VITALS — BP 125/87 | HR 95 | Temp 98.0°F | Resp 14

## 2013-08-25 DIAGNOSIS — E119 Type 2 diabetes mellitus without complications: Secondary | ICD-10-CM

## 2013-08-25 LAB — GLUCOSE, POCT (MANUAL RESULT ENTRY)
POC GLUCOSE: 320 mg/dL — AB (ref 70–99)
POC Glucose: 288 mg/dl — AB (ref 70–99)

## 2013-08-25 MED ORDER — INSULIN ASPART PROT & ASPART (70-30 MIX) 100 UNIT/ML ~~LOC~~ SUSP
20.0000 [IU] | Freq: Once | SUBCUTANEOUS | Status: AC
  Administered 2013-08-25: 20 [IU] via SUBCUTANEOUS

## 2013-08-25 MED ORDER — INSULIN ASPART 100 UNIT/ML ~~LOC~~ SOLN
20.0000 [IU] | Freq: Once | SUBCUTANEOUS | Status: AC
  Administered 2013-08-25: 20 [IU] via SUBCUTANEOUS

## 2013-08-25 NOTE — Progress Notes (Signed)
Patient is here for CBG check. Patient CBG was 320. 20 units of Insulin administered per protocol. CBG recheck was 288. Patient states he has not started [place] because his PCP was waiting on blood test. Consulted with Dr. Doreene Burke who reviewed patient's CMP and Kidney function is normal. Per Dr. Doreene Burke patient is to start Wilder Glade now and the prescription is at the pharmacy. Patient informed and verbalized understanding. Informed patient to bring his blood sugar to next visit.

## 2013-08-25 NOTE — Patient Instructions (Signed)
Blood Glucose Monitoring Monitoring your blood glucose (also know as blood sugar) helps you to manage your diabetes. It also helps you and your health care provider monitor your diabetes and determine how well your treatment plan is working. WHY SHOULD YOU MONITOR YOUR BLOOD GLUCOSE?  It can help you understand how food, exercise, and medicine affect your blood glucose.  It allows you to know what your blood glucose is at any given moment. You can quickly tell if you are having low blood glucose (hypoglycemia) or high blood glucose (hyperglycemia).  It can help you and your health care provider know how to adjust your medicines.  It can help you understand how to manage an illness or adjust medicine for exercise. WHEN SHOULD YOU TEST? Your health care provider will help you decide how often you should check your blood glucose. This may depend on the type of diabetes you have, your diabetes control, or the types of medicines you are taking. Be sure to write down all of your blood glucose readings so that this information can be reviewed with your health care provider. See below for examples of testing times that your health care provider may suggest. Type 1 Diabetes  Test 4 times a day if you are in good control, using an insulin pump, or perform multiple daily injections.  If your diabetes is not well-controlled or if you are sick, you may need to monitor more often.  It is a good idea to also monitor:  Before and after exercise.  Between meals and 2 hours after a meal.  Occasionally between 2:00 and 3:00 a.m. Type 2 Diabetes  It can vary with each person, but generally, if you are on insulin, test 4 times a day.  If you take medicines by mouth (orally), test 2 times a day.  If you are on a controlled diet, test once a day.  If your diabetes is not well controlled or if you are sick, you may need to monitor more often. HOW TO MONITOR YOUR BLOOD GLUCOSE Supplies Needed  Blood  glucose meter.  Test strips for your meter. Each meter has its own strips. You must use the strips that go with your own meter.  A pricking needle (lancet).  A device that holds the lancet (lancing device).  A journal or log book to write down your results. Procedure  Wash your hands with soap and water. Alcohol is not preferred.  Prick the side of your finger (not the tip) with the lancet.  Gently milk the finger until a small drop of blood appears.  Follow the instructions that come with your meter for inserting the test strip, applying blood to the strip, and using your blood glucose meter. Other Areas to Get Blood for Testing Some meters allow you to use other areas of your body (other than your finger) to test your blood. These areas are called alternative sites. The most common alternative sites are:  The forearm.  The thigh.  The back area of the lower leg.  The palm of the hand. The blood flow in these areas is slower. Therefore, the blood glucose values you get may be delayed, and the numbers are different from what you would get from your fingers. Do not use alternative sites if you think you are having hypoglycemia. Your reading will not be accurate. Always use a finger if you are having hypoglycemia. Also, if you cannot feel your lows (hypoglycemia unawareness), always use your fingers for your blood glucose  checks. ADDITIONAL TIPS FOR GLUCOSE MONITORING  Do not reuse lancets.  Always carry your supplies with you.  All blood glucose meters have a 24-hour "hotline" number to call if you have questions or need help.  Adjust (calibrate) your blood glucose meter with a control solution after finishing a few boxes of strips. BLOOD GLUCOSE RECORD KEEPING It is a good idea to keep a daily record or log of your blood glucose readings. Most glucose meters, if not all, keep your glucose records stored in the meter. Some meters come with the ability to download your records to  your home computer. Keeping a record of your blood glucose readings is especially helpful if you are wanting to look for patterns. Make notes to go along with the blood glucose readings because you might forget what happened at that exact time. Keeping good records helps you and your health care provider to work together to achieve good diabetes management.  Document Released: 02/08/2003 Document Revised: 02/10/2013 Document Reviewed: 06/30/2012 [facility] Patient Information 2015 [place], [place]. This information is not intended to replace advice given to you by your health care provider. Make sure you discuss any questions you have with your health care provider.

## 2013-09-03 ENCOUNTER — Telehealth: Payer: Self-pay

## 2013-09-03 NOTE — Telephone Encounter (Signed)
Patient not available Left message on voice mail to return our call 

## 2013-09-03 NOTE — Telephone Encounter (Signed)
Pt returning call

## 2013-09-03 NOTE — Telephone Encounter (Signed)
Message copied by Dorothe Pea on Thu Sep 03, 2013 12:45 PM ------      Message from: Angelica Chessman E      Created: Thu Sep 03, 2013  7:18 AM       Please inform patient that his laboratory tests results are mostly within normal except for the blood sugar and cholesterol level. Will encourage continue control of blood sugar and to continue medication for cholesterol, we also encouraged him to start regular physical exercise at least 3 times a week 30 minutes each time or as tolerated ------

## 2013-09-10 ENCOUNTER — Encounter: Payer: Self-pay | Admitting: Internal Medicine

## 2013-09-10 ENCOUNTER — Telehealth: Payer: Self-pay

## 2013-09-10 ENCOUNTER — Ambulatory Visit: Payer: No Typology Code available for payment source | Attending: Internal Medicine | Admitting: Internal Medicine

## 2013-09-10 VITALS — BP 130/76 | HR 94 | Temp 98.3°F | Resp 16 | Ht 72.0 in | Wt 298.0 lb

## 2013-09-10 DIAGNOSIS — E119 Type 2 diabetes mellitus without complications: Secondary | ICD-10-CM | POA: Insufficient documentation

## 2013-09-10 DIAGNOSIS — Z6841 Body Mass Index (BMI) 40.0 and over, adult: Secondary | ICD-10-CM | POA: Insufficient documentation

## 2013-09-10 DIAGNOSIS — I10 Essential (primary) hypertension: Secondary | ICD-10-CM | POA: Insufficient documentation

## 2013-09-10 DIAGNOSIS — B354 Tinea corporis: Secondary | ICD-10-CM | POA: Insufficient documentation

## 2013-09-10 DIAGNOSIS — E785 Hyperlipidemia, unspecified: Secondary | ICD-10-CM | POA: Insufficient documentation

## 2013-09-10 LAB — GLUCOSE, POCT (MANUAL RESULT ENTRY): POC GLUCOSE: 151 mg/dL — AB (ref 70–99)

## 2013-09-10 MED ORDER — METFORMIN HCL 1000 MG PO TABS: 1000.0000 mg | ORAL_TABLET | Freq: Two times a day (BID) | ORAL | Status: DC

## 2013-09-10 MED ORDER — AMLODIPINE BESYLATE 10 MG PO TABS
10.0000 mg | ORAL_TABLET | Freq: Every day | ORAL | Status: DC
Start: 2013-09-10 — End: 2014-12-02

## 2013-09-10 MED ORDER — GABAPENTIN 100 MG PO CAPS: 100.0000 mg | ORAL_CAPSULE | Freq: Three times a day (TID) | ORAL | Status: DC

## 2013-09-10 MED ORDER — DAPAGLIFLOZIN PROPANEDIOL 10 MG PO TABS: 10.0000 mg | ORAL_TABLET | Freq: Every day | ORAL | Status: DC

## 2013-09-10 MED ORDER — SIMVASTATIN 40 MG PO TABS
40.0000 mg | ORAL_TABLET | Freq: Every day | ORAL | Status: DC
Start: 2013-09-10 — End: 2014-12-02

## 2013-09-10 MED ORDER — TERBINAFINE HCL 1 % EX CREA: 1.0000 "application " | TOPICAL_CREAM | Freq: Two times a day (BID) | CUTANEOUS | Status: DC

## 2013-09-10 MED ORDER — GLIPIZIDE ER 10 MG PO TB24: 10.0000 mg | ORAL_TABLET | Freq: Every day | ORAL | Status: DC

## 2013-09-10 MED ORDER — LISINOPRIL-HYDROCHLOROTHIAZIDE 20-12.5 MG PO TABS
1.0000 | ORAL_TABLET | Freq: Every day | ORAL | Status: DC
Start: 2013-09-10 — End: 2014-12-02

## 2013-09-10 NOTE — Progress Notes (Signed)
Pt is here following up on his HTN and diabetes. Pt states that he has pain in his hands and in his second toe on his right foot.

## 2013-09-10 NOTE — Patient Instructions (Signed)
DASH Eating Plan DASH stands for "Dietary Approaches to Stop Hypertension." The DASH eating plan is a healthy eating plan that has been shown to reduce high blood pressure (hypertension). Additional health benefits may include reducing the risk of type 2 diabetes mellitus, heart disease, and stroke. The DASH eating plan may also help with weight loss. WHAT DO I NEED TO KNOW ABOUT THE DASH EATING PLAN? For the DASH eating plan, you will follow these general guidelines:  Choose foods with a percent daily value for sodium of less than 5% (as listed on the food label).  Use salt-free seasonings or herbs instead of table salt or sea salt.  Check with your health care provider or pharmacist before using salt substitutes.  Eat lower-sodium products, often labeled as "lower sodium" or "no salt added."  Eat fresh foods.  Eat more vegetables, fruits, and low-fat dairy products.  Choose whole grains. Look for the word "whole" as the first word in the ingredient list.  Choose fish and skinless chicken or turkey more often than red meat. Limit fish, poultry, and meat to 6 oz (170 g) each day.  Limit sweets, desserts, sugars, and sugary drinks.  Choose heart-healthy fats.  Limit cheese to 1 oz (28 g) per day.  Eat more home-cooked food and less restaurant, buffet, and fast food.  Limit fried foods.  Cook foods using methods other than frying.  Limit canned vegetables. If you do use them, rinse them well to decrease the sodium.  When eating at a restaurant, ask that your food be prepared with less salt, or no salt if possible. WHAT FOODS CAN I EAT? Seek help from a dietitian for individual calorie needs. Grains Whole grain or whole wheat bread. Brown rice. Whole grain or whole wheat pasta. Quinoa, bulgur, and whole grain cereals. Low-sodium cereals. Corn or whole wheat flour tortillas. Whole grain cornbread. Whole grain crackers. Low-sodium crackers. Vegetables Fresh or frozen vegetables  (raw, steamed, roasted, or grilled). Low-sodium or reduced-sodium tomato and vegetable juices. Low-sodium or reduced-sodium tomato sauce and paste. Low-sodium or reduced-sodium canned vegetables.  Fruits All fresh, canned (in natural juice), or frozen fruits. Meat and Other Protein Products Ground beef (85% or leaner), grass-fed beef, or beef trimmed of fat. Skinless chicken or turkey. Ground chicken or turkey. Pork trimmed of fat. All fish and seafood. Eggs. Dried beans, peas, or lentils. Unsalted nuts and seeds. Unsalted canned beans. Dairy Low-fat dairy products, such as skim or 1% milk, 2% or reduced-fat cheeses, low-fat ricotta or cottage cheese, or plain low-fat yogurt. Low-sodium or reduced-sodium cheeses. Fats and Oils Tub margarines without trans fats. Light or reduced-fat mayonnaise and salad dressings (reduced sodium). Avocado. Safflower, olive, or canola oils. Natural peanut or almond butter. Other Unsalted popcorn and pretzels. The items listed above may not be a complete list of recommended foods or beverages. Contact your dietitian for more options. WHAT FOODS ARE NOT RECOMMENDED? Grains White bread. White pasta. White rice. Refined cornbread. Bagels and croissants. Crackers that contain trans fat. Vegetables Creamed or fried vegetables. Vegetables in a cheese sauce. Regular canned vegetables. Regular canned tomato sauce and paste. Regular tomato and vegetable juices. Fruits Dried fruits. Canned fruit in light or heavy syrup. Fruit juice. Meat and Other Protein Products Fatty cuts of meat. Ribs, chicken wings, bacon, sausage, bologna, salami, chitterlings, fatback, hot dogs, bratwurst, and packaged luncheon meats. Salted nuts and seeds. Canned beans with salt. Dairy Whole or 2% milk, cream, half-and-half, and cream cheese. Whole-fat or sweetened yogurt. Full-fat   cheeses or blue cheese. Nondairy creamers and whipped toppings. Processed cheese, cheese spreads, or cheese  curds. Condiments Onion and garlic salt, seasoned salt, table salt, and sea salt. Canned and packaged gravies. Worcestershire sauce. Tartar sauce. Barbecue sauce. Teriyaki sauce. Soy sauce, including reduced sodium. Steak sauce. Fish sauce. Oyster sauce. Cocktail sauce. Horseradish. Ketchup and mustard. Meat flavorings and tenderizers. Bouillon cubes. Hot sauce. Tabasco sauce. Marinades. Taco seasonings. Relishes. Fats and Oils Butter, stick margarine, lard, shortening, ghee, and bacon fat. Coconut, palm kernel, or palm oils. Regular salad dressings. Other Pickles and olives. Salted popcorn and pretzels. The items listed above may not be a complete list of foods and beverages to avoid. Contact your dietitian for more information. WHERE CAN I FIND MORE INFORMATION? National Heart, Lung, and Blood Institute: travelstabloid.com Document Released: 01/25/2011 Document Revised: 06/22/2013 Document Reviewed: 12/10/2012 [facility] Patient Information 2015 [place], [place]. This information is not intended to replace advice given to you by your health care provider. Make sure you discuss any questions you have with your health care provider. Hypertension Hypertension, commonly called high blood pressure, is when the force of blood pumping through your arteries is too strong. Your arteries are the blood vessels that carry blood from your heart throughout your body. A blood pressure reading consists of a higher number over a lower number, such as 110/72. The higher number (systolic) is the pressure inside your arteries when your heart pumps. The lower number (diastolic) is the pressure inside your arteries when your heart relaxes. Ideally you want your blood pressure below 120/80. Hypertension forces your heart to work harder to pump blood. Your arteries may become narrow or stiff. Having hypertension puts you at risk for heart disease, stroke, and other problems.  RISK  FACTORS Some risk factors for high blood pressure are controllable. Others are not.  Risk factors you cannot control include:   Race. You may be at higher risk if you are African American.  Age. Risk increases with age.  Gender. Men are at higher risk than women before age 37 years. After age 55, women are at higher risk than men. Risk factors you can control include:  Not getting enough exercise or physical activity.  Being overweight.  Getting too much fat, sugar, calories, or salt in your diet.  Drinking too much alcohol. SIGNS AND SYMPTOMS Hypertension does not usually cause signs or symptoms. Extremely high blood pressure (hypertensive crisis) may cause headache, anxiety, shortness of breath, and nosebleed. DIAGNOSIS  To check if you have hypertension, your health care provider will measure your blood pressure while you are seated, with your arm held at the level of your heart. It should be measured at least twice using the same arm. Certain conditions can cause a difference in blood pressure between your right and left arms. A blood pressure reading that is higher than normal on one occasion does not mean that you need treatment. If one blood pressure reading is high, ask your health care provider about having it checked again. TREATMENT  Treating high blood pressure includes making lifestyle changes and possibly taking medicine. Living a healthy lifestyle can help lower high blood pressure. You may need to change some of your habits. Lifestyle changes may include:  Following the DASH diet. This diet is high in fruits, vegetables, and whole grains. It is low in salt, red meat, and added sugars.  Getting at least 2 hours of brisk physical activity every week.  Losing weight if necessary.  Not smoking.  Limiting  alcoholic beverages.  Learning ways to reduce stress. If lifestyle changes are not enough to get your blood pressure under control, your health care provider may  prescribe medicine. You may need to take more than one. Work closely with your health care provider to understand the risks and benefits. HOME CARE INSTRUCTIONS  Have your blood pressure rechecked as directed by your health care provider.   Take medicines only as directed by your health care provider. Follow the directions carefully. Blood pressure medicines must be taken as prescribed. The medicine does not work as well when you skip doses. Skipping doses also puts you at risk for problems.   Do not smoke.   Monitor your blood pressure at home as directed by your health care provider. SEEK MEDICAL CARE IF:   You think you are having a reaction to medicines taken.  You have recurrent headaches or feel dizzy.  You have swelling in your ankles.  You have trouble with your vision. SEEK IMMEDIATE MEDICAL CARE IF:  You develop a severe headache or confusion.  You have unusual weakness, numbness, or feel faint.  You have severe chest or abdominal pain.  You vomit repeatedly.  You have trouble breathing. MAKE SURE YOU:   Understand these instructions.  Will watch your condition.  Will get help right away if you are not doing well or get worse. Document Released: 02/05/2005 Document Revised: 06/22/2013 Document Reviewed: 11/28/2012 [facility] Patient Information 2015 [place], [place]. This information is not intended to replace advice given to you by your health care provider. Make sure you discuss any questions you have with your health care provider. Diabetes Mellitus and Food It is important for you to manage your blood sugar (glucose) level. Your blood glucose level can be greatly affected by what you eat. Eating healthier foods in the appropriate amounts throughout the day at about the same time each day will help you control your blood glucose level. It can also help slow or prevent worsening of your diabetes mellitus. Healthy eating may even help you improve the level of your  blood pressure and reach or maintain a healthy weight.  HOW CAN FOOD AFFECT ME? Carbohydrates Carbohydrates affect your blood glucose level more than any other type of food. Your dietitian will help you determine how many carbohydrates to eat at each meal and teach you how to count carbohydrates. Counting carbohydrates is important to keep your blood glucose at a healthy level, especially if you are using insulin or taking certain medicines for diabetes mellitus. Alcohol Alcohol can cause sudden decreases in blood glucose (hypoglycemia), especially if you use insulin or take certain medicines for diabetes mellitus. Hypoglycemia can be a life-threatening condition. Symptoms of hypoglycemia (sleepiness, dizziness, and disorientation) are similar to symptoms of having too much alcohol.  If your health care provider has given you approval to drink alcohol, do so in moderation and use the following guidelines:  Women should not have more than one drink per day, and men should not have more than two drinks per day. One drink is equal to:  12 oz of beer.  5 oz of wine.  1 oz of hard liquor.  Do not drink on an empty stomach.  Keep yourself hydrated. Have water, diet soda, or unsweetened iced tea.  Regular soda, juice, and other mixers might contain a lot of carbohydrates and should be counted. WHAT FOODS ARE NOT RECOMMENDED? As you make food choices, it is important to remember that all foods are not the same.  Some foods have fewer nutrients per serving than other foods, even though they might have the same number of calories or carbohydrates. It is difficult to get your body what it needs when you eat foods with fewer nutrients. Examples of foods that you should avoid that are high in calories and carbohydrates but low in nutrients include:  Trans fats (most processed foods list trans fats on the Nutrition Facts label).  Regular soda.  Juice.  Candy.  Sweets, such as cake, pie, doughnuts,  and cookies.  Fried foods. WHAT FOODS CAN I EAT? Have nutrient-rich foods, which will nourish your body and keep you healthy. The food you should eat also will depend on several factors, including:  The calories you need.  The medicines you take.  Your weight.  Your blood glucose level.  Your blood pressure level.  Your cholesterol level. You also should eat a variety of foods, including:  Protein, such as meat, poultry, fish, tofu, nuts, and seeds (lean animal proteins are best).  Fruits.  Vegetables.  Dairy products, such as milk, cheese, and yogurt (low fat is best).  Breads, grains, pasta, cereal, rice, and beans.  Fats such as olive oil, trans fat-free margarine, canola oil, avocado, and olives. DOES EVERYONE WITH DIABETES MELLITUS HAVE THE SAME MEAL PLAN? Because every person with diabetes mellitus is different, there is not one meal plan that works for everyone. It is very important that you meet with a dietitian who will help you create a meal plan that is just right for you. Document Released: 11/02/2004 Document Revised: 02/10/2013 Document Reviewed: 01/02/2013 [facility] Patient Information 2015 [place], [place]. This information is not intended to replace advice given to you by your health care provider. Make sure you discuss any questions you have with your health care provider. Diabetes and Exercise Exercising regularly is important. It is not just about losing weight. It has many health benefits, such as:  Improving your overall fitness, flexibility, and endurance.  Increasing your bone density.  Helping with weight control.  Decreasing your body fat.  Increasing your muscle strength.  Reducing stress and tension.  Improving your overall health. People with diabetes who exercise gain additional benefits because exercise:  Reduces appetite.  Improves the body's use of blood sugar (glucose).  Helps lower or control blood glucose.  Decreases blood  pressure.  Helps control blood lipids (such as cholesterol and triglycerides).  Improves the body's use of the hormone insulin by:  Increasing the body's insulin sensitivity.  Reducing the body's insulin needs.  Decreases the risk for heart disease because exercising:  Lowers cholesterol and triglycerides levels.  Increases the levels of good cholesterol (such as high-density lipoproteins [HDL]) in the body.  Lowers blood glucose levels. YOUR ACTIVITY PLAN  Choose an activity that you enjoy and set realistic goals. Your health care provider or diabetes educator can help you make an activity plan that works for you. Exercise regularly as directed by your health care provider. This includes:  Performing resistance training twice a week such as push-ups, sit-ups, lifting weights, or using resistance bands.  Performing 150 minutes of cardio exercises each week such as walking, running, or playing sports.  Staying active and spending no more than 90 minutes at one time being inactive. Even short bursts of exercise are good for you. Three 10-minute sessions spread throughout the day are just as beneficial as a single 30-minute session. Some exercise ideas include:  Taking the dog for a walk.  Taking the stairs instead  of the elevator.  Dancing to your favorite song.  Doing an exercise video.  Doing your favorite exercise with a friend. RECOMMENDATIONS FOR EXERCISING WITH TYPE 1 OR TYPE 2 DIABETES   Check your blood glucose before exercising. If blood glucose levels are greater than 240 mg/dL, check for urine ketones. Do not exercise if ketones are present.  Avoid injecting insulin into areas of the body that are going to be exercised. For example, avoid injecting insulin into:  The arms when playing tennis.  The legs when jogging.  Keep a record of:  Food intake before and after you exercise.  Expected peak times of insulin action.  Blood glucose levels before and after  you exercise.  The type and amount of exercise you have done.  Review your records with your health care provider. Your health care provider will help you to develop guidelines for adjusting food intake and insulin amounts before and after exercising.  If you take insulin or oral hypoglycemic agents, watch for signs and symptoms of hypoglycemia. They include:  Dizziness.  Shaking.  Sweating.  Chills.  Confusion.  Drink plenty of water while you exercise to prevent dehydration or heat stroke. Body water is lost during exercise and must be replaced.  Talk to your health care provider before starting an exercise program to make sure it is safe for you. Remember, almost any type of activity is better than none. Document Released: 04/28/2003 Document Revised: 06/22/2013 Document Reviewed: 07/15/2012 [facility] Patient Information 2015 [employer], [place]. This information is not intended to replace advice given to you by your health care provider. Make sure you discuss any questions you have with your health care provider.

## 2013-09-10 NOTE — Progress Notes (Signed)
Patient ID: Anthony Vincent, male   DOB: 10-08-1970, 43 y.o.   MRN: [id number]   Anthony Vincent, is a 43 y.o. male  FOY:[id number]  [id number]:[id number]  DOB - 05/28/1970  Chief Complaint  Patient presents with  . Follow-up        Subjective:   Anthony Vincent is a 43 y.o. male here today for a follow up visit. Patient is known to have diabetes mellitus uncontrolled, dyslipidemia, and hypertension here today for followup. He was found to have very high blood sugar in the clinic recently despite metformin 1000 mg tablet by mouth twice a day and glipizide 10 mg tablet by mouth daily. Farxiga 10 mg tablet by mouth daily was added to the medication regimen was told to followup today for blood sugar check. The patient is doing very well. He reports no side effects. He is also lisinopril and hydrochlorothiazide for hypertension and simvastatin for dyslipidemia, his blood pressure is controlled. He does not smoke cigarettes he does not drink alcohol. Patient has No headache, No chest pain, No abdominal pain - No Nausea, No new weakness tingling or numbness, No Cough - SOB.  Problem  Tinea Corporis    ALLERGIES: No Known Allergies  PAST MEDICAL HISTORY: Past Medical History  Diagnosis Date  . Diabetes mellitus without complication   . Hyperlipidemia   . Hypertension     MEDICATIONS AT HOME: Prior to Admission medications   Medication Sig Start Date End Date Taking? Authorizing Provider  amLODipine (NORVASC) 10 MG tablet Take 1 tablet (10 mg total) by mouth daily. 09/10/13  Yes Angelica Chessman, MD  Dapagliflozin Propanediol (FARXIGA) 10 MG TABS Take 10 mg by mouth daily. 09/10/13  Yes Angelica Chessman, MD  gabapentin (NEURONTIN) 100 MG capsule Take 1 capsule (100 mg total) by mouth 3 (three) times daily. 09/10/13  Yes Angelica Chessman, MD  glipiZIDE (GLIPIZIDE XL) 10 MG 24 hr tablet Take 1 tablet (10 mg total) by mouth daily with breakfast. 09/10/13  Yes Angelica Chessman, MD    lisinopril-hydrochlorothiazide (PRINZIDE,ZESTORETIC) 20-12.5 MG per tablet Take 1 tablet by mouth daily. 09/10/13  Yes Angelica Chessman, MD  metFORMIN (GLUCOPHAGE) 1000 MG tablet Take 1 tablet (1,000 mg total) by mouth 2 (two) times daily with a meal. 09/10/13  Yes Angelica Chessman, MD  simvastatin (ZOCOR) 40 MG tablet Take 1 tablet (40 mg total) by mouth daily at 6 PM. 09/10/13  Yes Angelica Chessman, MD  doxycycline (VIBRAMYCIN) 100 MG capsule Take 1 capsule (100 mg total) by mouth 2 (two) times daily. 11/19/12   Melissa R Smith, PA-C  terbinafine (LAMISIL AT) 1 % cream Apply 1 application topically 2 (two) times daily. 09/10/13   Angelica Chessman, MD     Objective:   Filed Vitals:   09/10/13 1725  BP: 130/76  Pulse: 94  Temp: 98.3 F (36.8 C)  TempSrc: Oral  Resp: 16  Height: 6' (1.829 m)  Weight: 298 lb (135.172 kg)  SpO2: 96%    Exam General appearance : Awake, alert, not in any distress. Speech Clear. Not toxic looking, morbidly obese HEENT: Atraumatic and Normocephalic, pupils equally reactive to light and accomodation Neck: supple, no JVD. No cervical lymphadenopathy.  Chest:Good air entry bilaterally, no added sounds  CVS: S1 S2 regular, no murmurs.  Abdomen: Bowel sounds present, Non tender and not distended with no gaurding, rigidity or rebound. Extremities: B/L Lower Ext shows no edema, both legs are warm to touch Neurology: Awake alert, and oriented X 3, CN II-XII intact, Non focal  Skin: Tinea corporis Wounds:N/A  Data Review Lab Results  Component Value Date   HGBA1C 9.7 08/18/2013   HGBA1C 9.9 02/02/2013   HGBA1C 9.4* 10/01/2012     Assessment & Plan   1. Type 2 diabetes mellitus without complication  - Glucose (CBG) - gabapentin (NEURONTIN) 100 MG capsule; Take 1 capsule (100 mg total) by mouth 3 (three) times daily.  Dispense: 270 capsule; Refill: 3 - glipiZIDE (GLIPIZIDE XL) 10 MG 24 hr tablet; Take 1 tablet (10 mg total) by mouth daily with  breakfast.  Dispense: 90 tablet; Refill: 3 - metFORMIN (GLUCOPHAGE) 1000 MG tablet; Take 1 tablet (1,000 mg total) by mouth 2 (two) times daily with a meal.  Dispense: 180 tablet; Refill: 3 - Dapagliflozin Propanediol (FARXIGA) 10 MG TABS; Take 10 mg by mouth daily.  Dispense: 30 tablet; Refill: 3  2. Dyslipidemia  - simvastatin (ZOCOR) 40 MG tablet; Take 1 tablet (40 mg total) by mouth daily at 6 PM.  Dispense: 90 tablet; Refill: 3  3. Essential hypertension, benign  - amLODipine (NORVASC) 10 MG tablet; Take 1 tablet (10 mg total) by mouth daily.  Dispense: 90 tablet; Refill: 3 - lisinopril-hydrochlorothiazide (PRINZIDE,ZESTORETIC) 20-12.5 MG per tablet; Take 1 tablet by mouth daily.  Dispense: 90 tablet; Refill: 3  4. Tinea corporis  - terbinafine (LAMISIL AT) 1 % cream; Apply 1 application topically 2 (two) times daily.  Dispense: 60 g; Refill: 3 Patient was counseled extensively about nutrition and exercise  Return in about 3 months (around 12/11/2013), or if symptoms worsen or fail to improve, for Hemoglobin A1C and Follow up, DM, Follow up HTN, Follow up Pain and comorbidities.  The patient was given clear instructions to go to ER or return to medical center if symptoms don't improve, worsen or new problems develop. The patient verbalized understanding. The patient was told to call to get lab results if they haven't heard anything in the next week.   This note has been created with Surveyor, quantity. Any transcriptional errors are unintentional.    Angelica Chessman, MD, [place], [place], [place] and [place], [place]   09/10/2013, 5:56 PM

## 2013-09-10 NOTE — Telephone Encounter (Signed)
Returned patient phone call Patient not available Left message on voice mail to return our call 

## 2014-01-04 ENCOUNTER — Ambulatory Visit: Payer: No Typology Code available for payment source | Attending: Internal Medicine | Admitting: Internal Medicine

## 2014-01-04 VITALS — BP 114/78 | HR 105 | Temp 99.1°F | Resp 20

## 2014-01-04 DIAGNOSIS — J029 Acute pharyngitis, unspecified: Secondary | ICD-10-CM

## 2014-01-04 DIAGNOSIS — R05 Cough: Secondary | ICD-10-CM | POA: Diagnosis not present

## 2014-01-04 NOTE — Progress Notes (Signed)
Patient walked in c/o sore throat and minor aches for 5 days C/o cough for 3 days productive of small amt yellow sputum Denies fever, chills, fatigue Patient states using OTC cold and flu with some relief Also taking echinacea, green tea and salt water gargles   BP 114/78 P 105 R 20 T 99.1 oral SPO2 97%  Denies tenderness to paplation over maxillary and frontal sinuses No lymphadenopathy noted Ears clear bilatrally Nasal turrbinates red and engorged with clear drainage noted bilaterally    Rapid Strep A negative  Per provider: Treat as viral at this time May take mucinex OTC Throat lozenges prn Rest and fluids (water) Call back if symptoms don't improve or worsen

## 2014-01-07 LAB — CULTURE, GROUP A STREP

## 2014-01-12 ENCOUNTER — Telehealth: Payer: Self-pay | Admitting: *Deleted

## 2014-01-12 MED ORDER — AMOXICILLIN 500 MG PO CAPS: 500.0000 mg | ORAL_CAPSULE | Freq: Two times a day (BID) | ORAL | Status: DC

## 2014-01-12 NOTE — Telephone Encounter (Signed)
Left VM to return my call

## 2014-01-12 NOTE — Telephone Encounter (Signed)
Pt is aware of his culture results. I sent antibiotics to [facility].

## 2014-09-10 ENCOUNTER — Other Ambulatory Visit: Payer: Self-pay | Admitting: Internal Medicine

## 2014-10-18 ENCOUNTER — Other Ambulatory Visit: Payer: Self-pay | Admitting: Internal Medicine

## 2014-10-19 ENCOUNTER — Telehealth: Payer: Self-pay | Admitting: Internal Medicine

## 2014-10-19 NOTE — Telephone Encounter (Signed)
Patient called requesting PCP visit, patient will need medication refill before visit, medications needed are metFORMIN (GLUCOPHAGE) 1000 MG tablet, amLODipine (NORVASC) 10 MG tablet,glipiZIDE (GLIPIZIDE XL) 10 MG 24 hr tablet,lisinopril-hydrochlorothiazide (PRINZIDE,ZESTORETIC) 20-12.5 MG per tablet. Please f/u

## 2014-10-22 ENCOUNTER — Other Ambulatory Visit: Payer: Self-pay | Admitting: *Deleted

## 2014-10-22 DIAGNOSIS — E119 Type 2 diabetes mellitus without complications: Secondary | ICD-10-CM

## 2014-10-22 MED ORDER — GLIPIZIDE ER 10 MG PO TB24: 10.0000 mg | ORAL_TABLET | Freq: Every day | ORAL | Status: DC

## 2014-10-22 MED ORDER — METFORMIN HCL 1000 MG PO TABS: 1000.0000 mg | ORAL_TABLET | Freq: Two times a day (BID) | ORAL | Status: DC

## 2014-10-22 NOTE — Telephone Encounter (Signed)
Patient is checking on the status of the following medications listed below. Please follow up. Thank you!

## 2014-10-23 ENCOUNTER — Other Ambulatory Visit: Payer: Self-pay | Admitting: Internal Medicine

## 2014-10-26 ENCOUNTER — Telehealth: Payer: Self-pay | Admitting: Internal Medicine

## 2014-10-26 NOTE — Telephone Encounter (Signed)
metFORMIN (GLUCOPHAGE) 1000 MG tablet glipiZIDE (GLIPIZIDE XL) 10 MG 24 hr tablet

## 2014-11-02 ENCOUNTER — Telehealth: Payer: Self-pay | Admitting: Internal Medicine

## 2014-11-02 NOTE — Telephone Encounter (Signed)
Patient called requesting med refill on lisinopril-hydrochlorothiazide (PRINZIDE,ZESTORETIC) 20-12.5 MG per tablet, and amLODipine (NORVASC) 10 MG tablet. Please f/u

## 2014-11-15 NOTE — Telephone Encounter (Signed)
Patient called requesting med refill on lisinopril-hydrochlorothiazide (PRINZIDE,ZESTORETIC) 20-12.5 MG per tablet, and amLODipine (NORVASC) 10 MG tablet. Please f/u

## 2014-11-23 NOTE — Telephone Encounter (Signed)
Patient called requesting a med refill on metFORMIN (GLUCOPHAGE) 1000 MG tablet, glipiZIDE (GLIPIZIDE XL) 10 MG 24 hr tablet, lisinopril-hydrochlorothiazide (PRINZIDE,ZESTORETIC) 20-12.5 MG per tablet, and amLODipine (NORVASC) 10 MG tablet, Please f/u with pt.

## 2014-11-25 ENCOUNTER — Telehealth: Payer: Self-pay | Admitting: Internal Medicine

## 2014-11-25 NOTE — Telephone Encounter (Signed)
Patient came in requesting a medication refill for glipiZIDE (GLIPIZIDE XL), metFORMIN (GLUCOPHAGE) ,   lisinopril-hydrochlorothiazide (PRINZIDE,ZESTORETIC)     amLODipine (NORVASC) , Please follow up.

## 2014-11-26 NOTE — Telephone Encounter (Signed)
Patient verified DOB. Patient states he has been trying to schedule an appointment for the past month. Patient was last instructed to call on Monday to schedule an appointment. Patient made aware of courtousy refill in September and disclaimer stating no additional refills until OV is made. Patient advised to call at 9:00 sharp Monday morning. Patient had no further questions.

## 2014-12-02 ENCOUNTER — Ambulatory Visit: Payer: BC Managed Care – PPO | Attending: Internal Medicine | Admitting: Internal Medicine

## 2014-12-02 ENCOUNTER — Encounter: Payer: Self-pay | Admitting: Internal Medicine

## 2014-12-02 VITALS — BP 142/95 | HR 95 | Temp 98.2°F | Resp 18

## 2014-12-02 DIAGNOSIS — Z79899 Other long term (current) drug therapy: Secondary | ICD-10-CM | POA: Diagnosis not present

## 2014-12-02 DIAGNOSIS — Z9114 Patient's other noncompliance with medication regimen: Secondary | ICD-10-CM | POA: Diagnosis not present

## 2014-12-02 DIAGNOSIS — I10 Essential (primary) hypertension: Secondary | ICD-10-CM | POA: Diagnosis not present

## 2014-12-02 DIAGNOSIS — Z833 Family history of diabetes mellitus: Secondary | ICD-10-CM | POA: Diagnosis not present

## 2014-12-02 DIAGNOSIS — Z23 Encounter for immunization: Secondary | ICD-10-CM | POA: Insufficient documentation

## 2014-12-02 DIAGNOSIS — E119 Type 2 diabetes mellitus without complications: Secondary | ICD-10-CM

## 2014-12-02 DIAGNOSIS — Z7984 Long term (current) use of oral hypoglycemic drugs: Secondary | ICD-10-CM | POA: Diagnosis not present

## 2014-12-02 DIAGNOSIS — Z0000 Encounter for general adult medical examination without abnormal findings: Secondary | ICD-10-CM

## 2014-12-02 DIAGNOSIS — E785 Hyperlipidemia, unspecified: Secondary | ICD-10-CM | POA: Diagnosis not present

## 2014-12-02 LAB — COMPLETE METABOLIC PANEL WITH GFR
ALT: 48 U/L — AB (ref 9–46)
AST: 26 U/L (ref 10–40)
Albumin: 4.4 g/dL (ref 3.6–5.1)
Alkaline Phosphatase: 80 U/L (ref 40–115)
BUN: 12 mg/dL (ref 7–25)
CHLORIDE: 98 mmol/L (ref 98–110)
CO2: 24 mmol/L (ref 20–31)
CREATININE: 1.01 mg/dL (ref 0.60–1.35)
Calcium: 9.4 mg/dL (ref 8.6–10.3)
GFR, Est African American: >89 mL/min (ref >=60)
GFR, Est Non African American: 89 mL/min (ref >=60)
Glucose, Bld: 343 mg/dL — ABNORMAL HIGH (ref 65–99)
Potassium: 4.6 mmol/L (ref 3.5–5.3)
Sodium: 136 mmol/L (ref 135–146)
Total Bilirubin: 1.1 mg/dL (ref 0.2–1.2)
Total Protein: 7.8 g/dL (ref 6.1–8.1)

## 2014-12-02 LAB — CBC WITH DIFFERENTIAL/PLATELET
BASOS ABS: 0 10*3/uL (ref 0.0–0.1)
Basophils Relative: 0 % (ref 0–1)
EOS ABS: 0.1 10*3/uL (ref 0.0–0.7)
EOS PCT: 2 % (ref 0–5)
HCT: 49.5 % (ref 39.0–52.0)
Hemoglobin: 16.8 g/dL (ref 13.0–17.0)
LYMPHS PCT: 31 % (ref 12–46)
Lymphs Abs: 2.1 10*3/uL (ref 0.7–4.0)
MCH: 28 pg (ref 26.0–34.0)
MCHC: 33.9 g/dL (ref 30.0–36.0)
MCV: 82.5 fL (ref 78.0–100.0)
MPV: 9.8 fL (ref 8.6–12.4)
Monocytes Absolute: 0.5 10*3/uL (ref 0.1–1.0)
Monocytes Relative: 7 % (ref 3–12)
NEUTROS PCT: 60 % (ref 43–77)
Neutro Abs: 4.1 10*3/uL (ref 1.7–7.7)
PLATELETS: 235 10*3/uL (ref 150–400)
RBC: 6 MIL/uL — AB (ref 4.22–5.81)
RDW: 15.9 % — AB (ref 11.5–15.5)
WBC: 6.8 10*3/uL (ref 4.0–10.5)

## 2014-12-02 LAB — LIPID PANEL
Cholesterol: 210 mg/dL — ABNORMAL HIGH (ref 125–200)
HDL: 27 mg/dL — ABNORMAL LOW (ref >=40)
LDL CALC: 157 mg/dL — AB (ref <130)
TRIGLYCERIDES: 131 mg/dL (ref <150)
Total CHOL/HDL Ratio: 7.8 Ratio — ABNORMAL HIGH (ref <=5.0)
VLDL: 26 mg/dL (ref <30)

## 2014-12-02 LAB — GLUCOSE, POCT (MANUAL RESULT ENTRY): POC GLUCOSE: 375 mg/dL — AB (ref 70–99)

## 2014-12-02 LAB — POCT GLYCOSYLATED HEMOGLOBIN (HGB A1C): HEMOGLOBIN A1C: 10.9

## 2014-12-02 MED ORDER — AMLODIPINE BESYLATE 10 MG PO TABS: 10.0000 mg | ORAL_TABLET | Freq: Every day | ORAL | Status: DC

## 2014-12-02 MED ORDER — CANAGLIFLOZIN 100 MG PO TABS: 100.0000 mg | ORAL_TABLET | Freq: Every day | ORAL | Status: DC

## 2014-12-02 MED ORDER — GABAPENTIN 100 MG PO CAPS: 100.0000 mg | ORAL_CAPSULE | Freq: Three times a day (TID) | ORAL | Status: DC

## 2014-12-02 MED ORDER — METFORMIN HCL 1000 MG PO TABS
1000.0000 mg | ORAL_TABLET | Freq: Two times a day (BID) | ORAL | Status: DC
Start: 2014-12-02 — End: 2015-12-10

## 2014-12-02 MED ORDER — SIMVASTATIN 40 MG PO TABS
40.0000 mg | ORAL_TABLET | Freq: Every day | ORAL | Status: DC
Start: 2014-12-02 — End: 2015-12-22

## 2014-12-02 MED ORDER — LISINOPRIL-HYDROCHLOROTHIAZIDE 20-12.5 MG PO TABS: 1.0000 | ORAL_TABLET | Freq: Every day | ORAL | Status: DC

## 2014-12-02 MED ORDER — INSULIN ASPART 100 UNIT/ML ~~LOC~~ SOLN
20.0000 [IU] | Freq: Once | SUBCUTANEOUS | Status: AC
  Administered 2014-12-02: 20 [IU] via SUBCUTANEOUS

## 2014-12-02 MED ORDER — GLIPIZIDE ER 10 MG PO TB24: 10.0000 mg | ORAL_TABLET | Freq: Every day | ORAL | Status: DC

## 2014-12-02 NOTE — Patient Instructions (Signed)
DASH Eating Plan DASH stands for "Dietary Approaches to Stop Hypertension." The DASH eating plan is a healthy eating plan that has been shown to reduce high blood pressure (hypertension). Additional health benefits may include reducing the risk of type 2 diabetes mellitus, heart disease, and stroke. The DASH eating plan may also help with weight loss. WHAT DO I NEED TO KNOW ABOUT THE DASH EATING PLAN? For the DASH eating plan, you will follow these general guidelines:  Choose foods with a percent daily value for sodium of less than 5% (as listed on the food label).  Use salt-free seasonings or herbs instead of table salt or sea salt.  Check with your health care provider or pharmacist before using salt substitutes.  Eat lower-sodium products, often labeled as "lower sodium" or "no salt added."  Eat fresh foods.  Eat more vegetables, fruits, and low-fat dairy products.  Choose whole grains. Look for the word "whole" as the first word in the ingredient list.  Choose fish and skinless chicken or turkey more often than red meat. Limit fish, poultry, and meat to 6 oz (170 g) each day.  Limit sweets, desserts, sugars, and sugary drinks.  Choose heart-healthy fats.  Limit cheese to 1 oz (28 g) per day.  Eat more home-cooked food and less restaurant, buffet, and fast food.  Limit fried foods.  Cook foods using methods other than frying.  Limit canned vegetables. If you do use them, rinse them well to decrease the sodium.  When eating at a restaurant, ask that your food be prepared with less salt, or no salt if possible. WHAT FOODS CAN I EAT? Seek help from a dietitian for individual calorie needs. Grains Whole grain or whole wheat bread. Brown rice. Whole grain or whole wheat pasta. Quinoa, bulgur, and whole grain cereals. Low-sodium cereals. Corn or whole wheat flour tortillas. Whole grain cornbread. Whole grain crackers. Low-sodium crackers. Vegetables Fresh or frozen vegetables  (raw, steamed, roasted, or grilled). Low-sodium or reduced-sodium tomato and vegetable juices. Low-sodium or reduced-sodium tomato sauce and paste. Low-sodium or reduced-sodium canned vegetables.  Fruits All fresh, canned (in natural juice), or frozen fruits. Meat and Other Protein Products Ground beef (85% or leaner), grass-fed beef, or beef trimmed of fat. Skinless chicken or turkey. Ground chicken or turkey. Pork trimmed of fat. All fish and seafood. Eggs. Dried beans, peas, or lentils. Unsalted nuts and seeds. Unsalted canned beans. Dairy Low-fat dairy products, such as skim or 1% milk, 2% or reduced-fat cheeses, low-fat ricotta or cottage cheese, or plain low-fat yogurt. Low-sodium or reduced-sodium cheeses. Fats and Oils Tub margarines without trans fats. Light or reduced-fat mayonnaise and salad dressings (reduced sodium). Avocado. Safflower, olive, or canola oils. Natural peanut or almond butter. Other Unsalted popcorn and pretzels. The items listed above may not be a complete list of recommended foods or beverages. Contact your dietitian for more options. WHAT FOODS ARE NOT RECOMMENDED? Grains White bread. White pasta. White rice. Refined cornbread. Bagels and croissants. Crackers that contain trans fat. Vegetables Creamed or fried vegetables. Vegetables in a cheese sauce. Regular canned vegetables. Regular canned tomato sauce and paste. Regular tomato and vegetable juices. Fruits Dried fruits. Canned fruit in light or heavy syrup. Fruit juice. Meat and Other Protein Products Fatty cuts of meat. Ribs, chicken wings, bacon, sausage, bologna, salami, chitterlings, fatback, hot dogs, bratwurst, and packaged luncheon meats. Salted nuts and seeds. Canned beans with salt. Dairy Whole or 2% milk, cream, half-and-half, and cream cheese. Whole-fat or sweetened yogurt. Full-fat   cheeses or blue cheese. Nondairy creamers and whipped toppings. Processed cheese, cheese spreads, or cheese  curds. Condiments Onion and garlic salt, seasoned salt, table salt, and sea salt. Canned and packaged gravies. Worcestershire sauce. Tartar sauce. Barbecue sauce. Teriyaki sauce. Soy sauce, including reduced sodium. Steak sauce. Fish sauce. Oyster sauce. Cocktail sauce. Horseradish. Ketchup and mustard. Meat flavorings and tenderizers. Bouillon cubes. Hot sauce. Tabasco sauce. Marinades. Taco seasonings. Relishes. Fats and Oils Butter, stick margarine, lard, shortening, ghee, and bacon fat. Coconut, palm kernel, or palm oils. Regular salad dressings. Other Pickles and olives. Salted popcorn and pretzels. The items listed above may not be a complete list of foods and beverages to avoid. Contact your dietitian for more information. WHERE CAN I FIND MORE INFORMATION? National [employer], [employer], [employer]: www.nhlbi.nih.gov/health/health-topics/topics/dash/   This information is not intended to replace advice given to you by your health care provider. Make sure you discuss any questions you have with your health care provider.   Document Released: 01/25/2011 Document Revised: 02/26/2014 Document Reviewed: 12/10/2012 Elsevier Interactive Patient Education 2016 [employer] Hypertension Hypertension, commonly called high blood pressure, is when the force of blood pumping through your arteries is too strong. Your arteries are the blood vessels that carry blood from your heart throughout your body. A blood pressure reading consists of a higher number over a lower number, such as 110/72. The higher number (systolic) is the pressure inside your arteries when your heart pumps. The lower number (diastolic) is the pressure inside your arteries when your heart relaxes. Ideally you want your blood pressure below 120/80. Hypertension forces your heart to work harder to pump blood. Your arteries may become narrow or stiff. Having untreated or uncontrolled hypertension can cause heart attack, stroke, kidney  disease, and other problems. RISK FACTORS Some risk factors for high blood pressure are controllable. Others are not.  Risk factors you cannot control include:   Race. You may be at higher risk if you are African American.  Age. Risk increases with age.  Gender. Men are at higher risk than women before age 45 years. After age 65, women are at higher risk than men. Risk factors you can control include:  Not getting enough exercise or physical activity.  Being overweight.  Getting too much fat, sugar, calories, or salt in your diet.  Drinking too much alcohol. SIGNS AND SYMPTOMS Hypertension does not usually cause signs or symptoms. Extremely high blood pressure (hypertensive crisis) may cause headache, anxiety, shortness of breath, and nosebleed. DIAGNOSIS To check if you have hypertension, your health care provider will measure your blood pressure while you are seated, with your arm held at the level of your heart. It should be measured at least twice using the same arm. Certain conditions can cause a difference in blood pressure between your right and left arms. A blood pressure reading that is higher than normal on one occasion does not mean that you need treatment. If it is not clear whether you have high blood pressure, you may be asked to return on a different day to have your blood pressure checked again. Or, you may be asked to monitor your blood pressure at home for 1 or more weeks. TREATMENT Treating high blood pressure includes making lifestyle changes and possibly taking medicine. Living a healthy lifestyle can help lower high blood pressure. You may need to change some of your habits. Lifestyle changes may include:  Following the DASH diet. This diet is high in fruits, vegetables, and whole grains.   It is low in salt, red meat, and added sugars.  Keep your sodium intake below 2,300 mg per day.  Getting at least 30-45 minutes of aerobic exercise at least 4 times per  week.  Losing weight if necessary.  Not smoking.  Limiting alcoholic beverages.  Learning ways to reduce stress. Your health care provider may prescribe medicine if lifestyle changes are not enough to get your blood pressure under control, and if one of the following is true:  You are 18-59 years of age and your systolic blood pressure is above 140.  You are 60 years of age or older, and your systolic blood pressure is above 150.  Your diastolic blood pressure is above 90.  You have diabetes, and your systolic blood pressure is over 140 or your diastolic blood pressure is over 90.  You have kidney disease and your blood pressure is above 140/90.  You have heart disease and your blood pressure is above 140/90. Your personal target blood pressure may vary depending on your medical conditions, your age, and other factors. HOME CARE INSTRUCTIONS  Have your blood pressure rechecked as directed by your health care provider.   Take medicines only as directed by your health care provider. Follow the directions carefully. Blood pressure medicines must be taken as prescribed. The medicine does not work as well when you skip doses. Skipping doses also puts you at risk for problems.  Do not smoke.   Monitor your blood pressure at home as directed by your health care provider. SEEK MEDICAL CARE IF:   You think you are having a reaction to medicines taken.  You have recurrent headaches or feel dizzy.  You have swelling in your ankles.  You have trouble with your vision. SEEK IMMEDIATE MEDICAL CARE IF:  You develop a severe headache or confusion.  You have unusual weakness, numbness, or feel faint.  You have severe chest or abdominal pain.  You vomit repeatedly.  You have trouble breathing. MAKE SURE YOU:   Understand these instructions.  Will watch your condition.  Will get help right away if you are not doing well or get worse.   This information is not intended to  replace advice given to you by your health care provider. Make sure you discuss any questions you have with your health care provider.   Document Released: 02/05/2005 Document Revised: 06/22/2014 Document Reviewed: 11/28/2012 Elsevier Interactive Patient Education 2016 [employer] Diabetes and Exercise Exercising regularly is important. It is not just about losing weight. It has many health benefits, such as:  Improving your overall fitness, flexibility, and endurance.  Increasing your bone density.  Helping with weight control.  Decreasing your body fat.  Increasing your muscle strength.  Reducing stress and tension.  Improving your overall health. People with diabetes who exercise gain additional benefits because exercise:  Reduces appetite.  Improves the body's use of blood sugar (glucose).  Helps lower or control blood glucose.  Decreases blood pressure.  Helps control blood lipids (such as cholesterol and triglycerides).  Improves the body's use of the hormone insulin by:  Increasing the body's insulin sensitivity.  Reducing the body's insulin needs.  Decreases the risk for heart disease because exercising:  Lowers cholesterol and triglycerides levels.  Increases the levels of good cholesterol (such as high-density lipoproteins [HDL]) in the body.  Lowers blood glucose levels. YOUR ACTIVITY PLAN  Choose an activity that you enjoy, and set realistic goals. To exercise safely, you should begin practicing any new physical   activity slowly, and gradually increase the intensity of the exercise over time. Your health care provider or diabetes educator can help create an activity plan that works for you. General recommendations include:  Encouraging children to engage in at least 60 minutes of physical activity each day.  Stretching and performing strength training exercises, such as yoga or weight lifting, at least 2 times per week.  Performing a total of at least  150 minutes of moderate-intensity exercise each week, such as brisk walking or water aerobics.  Exercising at least 3 days per week, making sure you allow no more than 2 consecutive days to pass without exercising.  Avoiding long periods of inactivity (90 minutes or more). When you have to spend an extended period of time sitting down, take frequent breaks to walk or stretch. RECOMMENDATIONS FOR EXERCISING WITH TYPE 1 OR TYPE 2 DIABETES   Check your blood glucose before exercising. If blood glucose levels are greater than 240 mg/dL, check for urine ketones. Do not exercise if ketones are present.  Avoid injecting insulin into areas of the body that are going to be exercised. For example, avoid injecting insulin into:  The arms when playing tennis.  The legs when jogging.  Keep a record of:  Food intake before and after you exercise.  Expected peak times of insulin action.  Blood glucose levels before and after you exercise.  The type and amount of exercise you have done.  Review your records with your health care provider. Your health care provider will help you to develop guidelines for adjusting food intake and insulin amounts before and after exercising.  If you take insulin or oral hypoglycemic agents, watch for signs and symptoms of hypoglycemia. They include:  Dizziness.  Shaking.  Sweating.  Chills.  Confusion.  Drink plenty of water while you exercise to prevent dehydration or heat stroke. Body water is lost during exercise and must be replaced.  Talk to your health care provider before starting an exercise program to make sure it is safe for you. Remember, almost any type of activity is better than none.   This information is not intended to replace advice given to you by your health care provider. Make sure you discuss any questions you have with your health care provider.   Document Released: 04/28/2003 Document Revised: 06/22/2014 Document Reviewed:  07/15/2012 Elsevier Interactive Patient Education 2016 [employer] Basic Carbohydrate Counting for Diabetes Mellitus Carbohydrate counting is a method for keeping track of the amount of carbohydrates you eat. Eating carbohydrates naturally increases the level of sugar (glucose) in your blood, so it is important for you to know the amount that is okay for you to have in every meal. Carbohydrate counting helps keep the level of glucose in your blood within normal limits. The amount of carbohydrates allowed is different for every person. A dietitian can help you calculate the amount that is right for you. Once you know the amount of carbohydrates you can have, you can count the carbohydrates in the foods you want to eat. Carbohydrates are found in the following foods:  Grains, such as breads and cereals.  Dried beans and soy products.  Starchy vegetables, such as potatoes, peas, and corn.  Fruit and fruit juices.  Milk and yogurt.  Sweets and snack foods, such as cake, cookies, candy, chips, soft drinks, and fruit drinks. CARBOHYDRATE COUNTING There are two ways to count the carbohydrates in your food. You can use either of the methods or a combination of   both. Reading the "Nutrition Facts" on Packaged Food The "Nutrition Facts" is an area that is included on the labels of almost all packaged food and beverages in the [place]. It includes the serving size of that food or beverage and information about the nutrients in each serving of the food, including the grams (g) of carbohydrate per serving.  Decide the number of servings of this food or beverage that you will be able to eat or drink. Multiply that number of servings by the number of grams of carbohydrate that is listed on the label for that serving. The total will be the amount of carbohydrates you will be having when you eat or drink this food or beverage. Learning Standard Serving Sizes of Food When you eat food that is not  packaged or does not include "Nutrition Facts" on the label, you need to measure the servings in order to count the amount of carbohydrates.A serving of most carbohydrate-rich foods contains about 15 g of carbohydrates. The following list includes serving sizes of carbohydrate-rich foods that provide 15 g ofcarbohydrate per serving:   1 slice of bread (1 oz) or 1 six-inch tortilla.    of a hamburger bun or English muffin.  4-6 crackers.   cup unsweetened dry cereal.    cup hot cereal.   cup rice or pasta.    cup mashed potatoes or  of a large baked potato.  1 cup fresh fruit or one small piece of fruit.    cup canned or frozen fruit or fruit juice.  1 cup milk.   cup plain fat-free yogurt or yogurt sweetened with artificial sweeteners.   cup cooked dried beans or starchy vegetable, such as peas, corn, or potatoes.  Decide the number of standard-size servings that you will eat. Multiply that number of servings by 15 (the grams of carbohydrates in that serving). For example, if you eat 2 cups of strawberries, you will have eaten 2 servings and 30 g of carbohydrates (2 servings x 15 g = 30 g). For foods such as soups and casseroles, in which more than one food is mixed in, you will need to count the carbohydrates in each food that is included. EXAMPLE OF CARBOHYDRATE COUNTING Sample Dinner  3 oz chicken breast.   cup of brown rice.   cup of corn.  1 cup milk.   1 cup strawberries with sugar-free whipped topping.  Carbohydrate Calculation Step 1: Identify the foods that contain carbohydrates:   Rice.   Corn.   Milk.   Strawberries. Step 2:Calculate the number of servings eaten of each:   2 servings of rice.   1 serving of corn.   1 serving of milk.   1 serving of strawberries. Step 3: Multiply each of those number of servings by 15 g:   2 servings of rice x 15 g = 30 g.   1 serving of corn x 15 g = 15 g.   1 serving of milk x 15  g = 15 g.   1 serving of strawberries x 15 g = 15 g. Step 4: Add together all of the amounts to find the total grams of carbohydrates eaten: 30 g + 15 g + 15 g + 15 g = 75 g.   This information is not intended to replace advice given to you by your health care provider. Make sure you discuss any questions you have with your health care provider.   Document Released: 02/05/2005 Document Revised: 02/26/2014 Document   Reviewed: 01/02/2013 Elsevier Interactive Patient Education 2016 [employer] Inc[employer]  

## 2014-12-02 NOTE — Progress Notes (Signed)
Metformin, glipi, farxiga out for week BP chol statin over month  Patient complains of numbness on right side of lower lip, right foot feels like he is stepping on something, middle finger has a bump per patient.  Patient denies pain at this time.

## 2014-12-02 NOTE — Progress Notes (Signed)
Patient ID: Anthony Vincent, male   DOB: 20-Aug-1970, 44 y.o.   MRN: [id number]   Anthony Vincent, is a 44 y.o. male  [id number]  [id number]  DOB - Sep 24, 1970  CC: No chief complaint on file.      HPI: Anthony Vincent is a 44 y.o. male here today for a follow up visit. Patient is known to have  DMT2 uncontrolled, HTN, dyslipidemia. He presents to clinic today with an elevated blood sugar of 375 and a increasing AIC of 10.9 up from 9.7 in June 2015. Patient stated his average morning fasting blood sugars were averaging 150 -175 when he was taking his medications. Patient is non compliant with medications, states he did not have insurance for a time and did not come for office visit. Has not taken Metformin, Glipizide or Farxiga in over a week. Has not taken Lisinopril/HTZ and simvastatin in over one month.  Patient with new complaints of right foot "feels like I am stepping on something" and "numbness on right lower lip". Patient has No headache, No chest pain, No abdominal pain - No Nausea, No new weakness tingling or numbness, No Cough - SOB.  No Known Allergies Past Medical History  Diagnosis Date  . Diabetes mellitus without complication ([place])   . Hyperlipidemia   . Hypertension    Current Outpatient Prescriptions on File Prior to Visit  Medication Sig Dispense Refill  . amLODipine (NORVASC) 10 MG tablet Take 1 tablet (10 mg total) by mouth daily. (Patient not taking: Reported on 12/02/2014) 90 tablet 3  . amoxicillin (AMOXIL) 500 MG capsule Take 1 capsule (500 mg total) by mouth 2 (two) times daily. (Patient not taking: Reported on 12/02/2014) 20 capsule 0  . Dapagliflozin Propanediol (FARXIGA) 10 MG TABS Take 10 mg by mouth daily. (Patient not taking: Reported on 12/02/2014) 30 tablet 3  . doxycycline (VIBRAMYCIN) 100 MG capsule Take 1 capsule (100 mg total) by mouth 2 (two) times daily. (Patient not taking: Reported on 12/02/2014) 20 capsule 0  . gabapentin (NEURONTIN) 100 MG  capsule Take 1 capsule (100 mg total) by mouth 3 (three) times daily. (Patient not taking: Reported on 12/02/2014) 270 capsule 3  . glipiZIDE (GLIPIZIDE XL) 10 MG 24 hr tablet Take 1 tablet (10 mg total) by mouth daily with breakfast. (Patient not taking: Reported on 12/02/2014) 30 tablet 0  . lisinopril-hydrochlorothiazide (PRINZIDE,ZESTORETIC) 20-12.5 MG per tablet Take 1 tablet by mouth daily. (Patient not taking: Reported on 12/02/2014) 90 tablet 3  . metFORMIN (GLUCOPHAGE) 1000 MG tablet Take 1 tablet (1,000 mg total) by mouth 2 (two) times daily with a meal. (Patient not taking: Reported on 12/02/2014) 60 tablet 0  . simvastatin (ZOCOR) 40 MG tablet Take 1 tablet (40 mg total) by mouth daily at 6 PM. (Patient not taking: Reported on 12/02/2014) 90 tablet 3  . terbinafine (LAMISIL AT) 1 % cream Apply 1 application topically 2 (two) times daily. (Patient not taking: Reported on 12/02/2014) 60 g 3   No current facility-administered medications on file prior to visit.   Family History  Problem Relation Age of Onset  . Diabetes Mother   . Diabetes Father    Social History   Social History  . Marital Status: Single    Spouse Name: N/A  . Number of Children: N/A  . Years of Education: N/A   Occupational History  . Not on file.   Social History Main Topics  . Smoking status: Never Smoker   . Smokeless tobacco: Not on file  .  Alcohol Use: No  . Drug Use: No  . Sexual Activity: Not on file   Other Topics Concern  . Not on file   Social History Narrative    Review of Systems  Constitutional: Negative.   HENT: Negative.   Eyes: Negative.   Respiratory: Negative.   Cardiovascular: Negative.   Gastrointestinal: Negative.   Genitourinary: Positive for frequency. Negative for dysuria, urgency, hematuria and flank pain.  Musculoskeletal: Negative.   Skin: Negative.   Neurological: Negative.   Endo/Heme/Allergies: Negative.   Psychiatric/Behavioral: Negative.     Objective:    BP 145/100 mmHg  Pulse 101  Temp(Src) 98.2 F (36.8 C)  Resp 18  Physical Exam  Constitutional: He is oriented to person, place, and time. He appears well-developed and well-nourished.  HENT:  Head: Normocephalic and atraumatic.  Right Ear: External ear normal.  Left Ear: External ear normal.  Nose: Nose normal.  Mouth/Throat: Oropharynx is clear and moist.  Right lower lip cracked with beginning of cold sore.  Eyes: Conjunctivae and EOM are normal. Pupils are equal, round, and reactive to light.  Neck: Normal range of motion.  Cardiovascular: Normal rate, regular rhythm, normal heart sounds and intact distal pulses.   Pulmonary/Chest: Effort normal and breath sounds normal.  Abdominal: Soft. Bowel sounds are normal.  Musculoskeletal: Normal range of motion.  Neurological: He is alert and oriented to person, place, and time.  Skin: Skin is warm and dry.  Bilateral feet extremely dry and cracked. Callus on the ball of right foot.   Psychiatric: He has a normal mood and affect. His behavior is normal. Judgment and thought content normal.  Nursing note and vitals reviewed.   Lab Results  Component Value Date   WBC 6.7 08/18/2013   HGB 16.0 08/18/2013   HCT 43.9 08/18/2013   MCV 75.0* 08/18/2013   PLT 236 08/18/2013   Lab Results  Component Value Date   CREATININE 0.90 08/18/2013   BUN 9 08/18/2013   NA 134* 08/18/2013   K 4.2 08/18/2013   CL 99 08/18/2013   CO2 25 08/18/2013    Lab Results  Component Value Date   HGBA1C 10.90 12/02/2014   Lipid Panel     Component Value Date/Time   CHOL 173 08/18/2013 1355   TRIG 89 08/18/2013 1355   HDL 28* 08/18/2013 1355   CHOLHDL 6.2 08/18/2013 1355   VLDL 18 08/18/2013 1355   LDLCALC 127* 08/18/2013 1355       Assessment and plan:   Diagnoses and all orders for this visit:  Type 2 diabetes mellitus without complication, unspecified long term insulin use status (HCC) -     Glucose (CBG) -     POCT A1C -      Microalbumin/Creatinine Ratio, Urine -     insulin aspart (novoLOG) injection 20 Units; Inject 0.2 mLs (20 Units total) into the skin once. -     gabapentin (NEURONTIN) 100 MG capsule; Take 1 capsule (100 mg total) by mouth 3 (three) times daily. -     glipiZIDE (GLIPIZIDE XL) 10 MG 24 hr tablet; Take 1 tablet (10 mg total) by mouth daily with breakfast. -     metFORMIN (GLUCOPHAGE) 1000 MG tablet; Take 1 tablet (1,000 mg total) by mouth 2 (two) times daily with a meal. -     canagliflozin (INVOKANA) 100 MG TABS tablet; Take 1 tablet (100 mg total) by mouth daily.  -     Ambulatory referral to Podiatry -  Ambulatory referral to Ophthalmology -     CBC with Differential/Platelet -     Urinalysis, Complete  Essential hypertension, benign  -     amLODipine (NORVASC) 10 MG tablet; Take 1 tablet (10 mg total) by mouth daily. -     lisinopril-hydrochlorothiazide (PRINZIDE,ZESTORETIC) 20-12.5 MG tablet; Take 1 tablet by mouth daily. -     COMPLETE METABOLIC PANEL WITH GFR  Patient was counseled on diet and importance of following DASH Diet.  Dyslipidemia -     simvastatin (ZOCOR) 40 MG tablet; Take 1 tablet (40 mg total) by mouth daily at 6 PM. -     Lipid panel  To address this please limit saturated fat to no more than 7% of your calories, limit cholesterol to 200 mg/day, increase fiber and exercise as tolerated. If needed we may add another cholesterol lowering medication to your regimen.   Patient was counseled extensively about the importance of diet and exercise.   The patient was given clear instructions to go to ER or return to medical center if symptoms don't improve, worsen or new problems develop. The patient verbalized understanding. The patient was told to call to get lab results if they haven't heard anything in the next week.     Clois Dupes, RN, BSN, AGNP, [place] and [place] [phone] 12/02/2014, 9:53 AM   Evaluation  and management procedures were performed by the Advanced Practitioner Student under my supervision and collaboration. I have reviewed the Advanced Practitioner's note and chart, and I agree with the management and plan.   Angelica Chessman, MD, [place], [place], [place], [place] and [place], [place]   12/02/2014, 6:09 PM

## 2014-12-03 LAB — URINALYSIS, COMPLETE
BACTERIA UA: NONE SEEN [HPF]
BILIRUBIN URINE: NEGATIVE
CASTS: NONE SEEN [LPF]
CRYSTALS: NONE SEEN [HPF]
HGB URINE DIPSTICK: NEGATIVE
Leukocytes, UA: NEGATIVE
Nitrite: NEGATIVE
RBC / HPF: NONE SEEN RBC/HPF (ref <=2)
SQUAMOUS EPITHELIAL / LPF: NONE SEEN [HPF] (ref <=5)
Specific Gravity, Urine: 1.038 — ABNORMAL HIGH (ref 1.001–1.035)
WBC UA: NONE SEEN WBC/HPF (ref <=5)
Yeast: NONE SEEN [HPF]
pH: 5.5 (ref 5.0–8.0)

## 2014-12-03 LAB — MICROALBUMIN / CREATININE URINE RATIO
Creatinine, Urine: 88.4 mg/dL
Microalb Creat Ratio: 112 mg/g — ABNORMAL HIGH (ref 0.0–30.0)
Microalb, Ur: 9.9 mg/dL — ABNORMAL HIGH (ref <2.0)

## 2014-12-15 ENCOUNTER — Telehealth: Payer: Self-pay | Admitting: *Deleted

## 2014-12-15 NOTE — Telephone Encounter (Signed)
Patient verified DOB Patient informed of lab results showing high cholesterol levels and evidence of diabetes effecting his kidneys with protein leakage. Patient advised on limiting saturated fats to 7% of his calories and 200 mg of cholesterol daily. Patient advised to increase fiber and implement exercise at 30 minutes/ 3 times a week. Patient advised of implementing these changes to reduce need of adding another lowering cholesterol medication. Patient expressed his understanding and had no further questions.

## 2014-12-15 NOTE — Telephone Encounter (Signed)
-----   Message from Tresa Garter, MD sent at 12/15/2014 10:46 AM EDT ----- Please inform patient that his laboratory test results shows high cholesterol level and evidence of diabetes effect on the kidneys with protein leakages. Encourage patient to take his cholesterol medicine as prescribed, and to address this please limit saturated fat to no more than 7% of your calories, limit cholesterol to 200 mg/day, increase fiber and exercise as tolerated. If needed we may add another cholesterol lowering medication to your regimen.

## 2014-12-15 NOTE — Telephone Encounter (Signed)
Medical Assistant left message on patient's home and cell voicemail. Voicemail states to give a call back to Sayge Brienza with [facility] at [phone].  

## 2014-12-15 NOTE — Telephone Encounter (Signed)
Pt. Returned call. Please f/u with pt. °

## 2014-12-30 ENCOUNTER — Encounter: Payer: Self-pay | Admitting: Podiatry

## 2014-12-30 ENCOUNTER — Encounter: Payer: Self-pay | Admitting: Internal Medicine

## 2014-12-30 ENCOUNTER — Ambulatory Visit (INDEPENDENT_AMBULATORY_CARE_PROVIDER_SITE_OTHER): Payer: BC Managed Care – PPO | Admitting: Podiatry

## 2014-12-30 ENCOUNTER — Ambulatory Visit: Payer: BC Managed Care – PPO | Attending: Internal Medicine | Admitting: Internal Medicine

## 2014-12-30 VITALS — BP 118/78 | HR 90 | Temp 98.4°F | Resp 20 | Ht 72.0 in | Wt 286.4 lb

## 2014-12-30 VITALS — BP 118/77 | HR 96 | Resp 12

## 2014-12-30 DIAGNOSIS — B351 Tinea unguium: Secondary | ICD-10-CM | POA: Diagnosis not present

## 2014-12-30 DIAGNOSIS — E119 Type 2 diabetes mellitus without complications: Secondary | ICD-10-CM | POA: Insufficient documentation

## 2014-12-30 DIAGNOSIS — I10 Essential (primary) hypertension: Secondary | ICD-10-CM | POA: Diagnosis not present

## 2014-12-30 DIAGNOSIS — Q828 Other specified congenital malformations of skin: Secondary | ICD-10-CM

## 2014-12-30 DIAGNOSIS — M79606 Pain in leg, unspecified: Secondary | ICD-10-CM | POA: Diagnosis not present

## 2014-12-30 DIAGNOSIS — E785 Hyperlipidemia, unspecified: Secondary | ICD-10-CM | POA: Diagnosis not present

## 2014-12-30 LAB — GLUCOSE, POCT (MANUAL RESULT ENTRY): POC GLUCOSE: 189 mg/dL — AB (ref 70–99)

## 2014-12-30 MED ORDER — TERBINAFINE HCL 250 MG PO TABS: 250.0000 mg | ORAL_TABLET | Freq: Every day | ORAL | Status: DC

## 2014-12-30 NOTE — Progress Notes (Signed)
Patient here for F/U HTN and DM.  Patient denies pain at this time.

## 2014-12-30 NOTE — Progress Notes (Signed)
   Subjective:    Patient ID: Anthony Vincent, male    DOB: 08-27-1970, 44 y.o.   MRN: [id number]  HPI PT STATED B/L TOES ARE ITCHING AND DRY BOTTOM OF THE FEET FOR LONG-TERM. FEET ARE GETTING WORSE AND SOMETIMES IT FEELS LIKE SOMETHING BOTTOM OF THE FEET BUT IT DOES NOT HURT. TRIED OTC ANTIFUNGAL CREAM-NO RELIEF.   Review of Systems  Skin: Positive for color change.       Objective:   Physical Exam        Assessment & Plan:

## 2014-12-30 NOTE — Patient Instructions (Signed)
Diabetes and Exercise Exercising regularly is important. It is not just about losing weight. It has many health benefits, such as:  Improving your overall fitness, flexibility, and endurance.  Increasing your bone density.  Helping with weight control.  Decreasing your body fat.  Increasing your muscle strength.  Reducing stress and tension.  Improving your overall health. People with diabetes who exercise gain additional benefits because exercise:  Reduces appetite.  Improves the body's use of blood sugar (glucose).  Helps lower or control blood glucose.  Decreases blood pressure.  Helps control blood lipids (such as cholesterol and triglycerides).  Improves the body's use of the hormone insulin by:  Increasing the body's insulin sensitivity.  Reducing the body's insulin needs.  Decreases the risk for heart disease because exercising:  Lowers cholesterol and triglycerides levels.  Increases the levels of good cholesterol (such as high-density lipoproteins [HDL]) in the body.  Lowers blood glucose levels. YOUR ACTIVITY PLAN  Choose an activity that you enjoy, and set realistic goals. To exercise safely, you should begin practicing any new physical activity slowly, and gradually increase the intensity of the exercise over time. Your health care provider or diabetes educator can help create an activity plan that works for you. General recommendations include:  Encouraging children to engage in at least 60 minutes of physical activity each day.  Stretching and performing strength training exercises, such as yoga or weight lifting, at least 2 times per week.  Performing a total of at least 150 minutes of moderate-intensity exercise each week, such as brisk walking or water aerobics.  Exercising at least 3 days per week, making sure you allow no more than 2 consecutive days to pass without exercising.  Avoiding long periods of inactivity (90 minutes or more). When you  have to spend an extended period of time sitting down, take frequent breaks to walk or stretch. RECOMMENDATIONS FOR EXERCISING WITH TYPE 1 OR TYPE 2 DIABETES   Check your blood glucose before exercising. If blood glucose levels are greater than 240 mg/dL, check for urine ketones. Do not exercise if ketones are present.  Avoid injecting insulin into areas of the body that are going to be exercised. For example, avoid injecting insulin into:  The arms when playing tennis.  The legs when jogging.  Keep a record of:  Food intake before and after you exercise.  Expected peak times of insulin action.  Blood glucose levels before and after you exercise.  The type and amount of exercise you have done.  Review your records with your health care provider. Your health care provider will help you to develop guidelines for adjusting food intake and insulin amounts before and after exercising.  If you take insulin or oral hypoglycemic agents, watch for signs and symptoms of hypoglycemia. They include:  Dizziness.  Shaking.  Sweating.  Chills.  Confusion.  Drink plenty of water while you exercise to prevent dehydration or heat stroke. Body water is lost during exercise and must be replaced.  Talk to your health care provider before starting an exercise program to make sure it is safe for you. Remember, almost any type of activity is better than none.   This information is not intended to replace advice given to you by your health care provider. Make sure you discuss any questions you have with your health care provider.   Document Released: 04/28/2003 Document Revised: 06/22/2014 Document Reviewed: 07/15/2012 Elsevier Interactive Patient Education 2016 Elsevier Inc. Basic Carbohydrate Counting for Diabetes Mellitus Carbohydrate counting   is a method for keeping track of the amount of carbohydrates you eat. Eating carbohydrates naturally increases the level of sugar (glucose) in your  blood, so it is important for you to know the amount that is okay for you to have in every meal. Carbohydrate counting helps keep the level of glucose in your blood within normal limits. The amount of carbohydrates allowed is different for every person. A dietitian can help you calculate the amount that is right for you. Once you know the amount of carbohydrates you can have, you can count the carbohydrates in the foods you want to eat. Carbohydrates are found in the following foods:  Grains, such as breads and cereals.  Dried beans and soy products.  Starchy vegetables, such as potatoes, peas, and corn.  Fruit and fruit juices.  Milk and yogurt.  Sweets and snack foods, such as cake, cookies, candy, chips, soft drinks, and fruit drinks. CARBOHYDRATE COUNTING There are two ways to count the carbohydrates in your food. You can use either of the methods or a combination of both. Reading the "Nutrition Facts" on Packaged Food The "Nutrition Facts" is an area that is included on the labels of almost all packaged food and beverages in the [place]. It includes the serving size of that food or beverage and information about the nutrients in each serving of the food, including the grams (g) of carbohydrate per serving.  Decide the number of servings of this food or beverage that you will be able to eat or drink. Multiply that number of servings by the number of grams of carbohydrate that is listed on the label for that serving. The total will be the amount of carbohydrates you will be having when you eat or drink this food or beverage. Learning Standard Serving Sizes of Food When you eat food that is not packaged or does not include "Nutrition Facts" on the label, you need to measure the servings in order to count the amount of carbohydrates.A serving of most carbohydrate-rich foods contains about 15 g of carbohydrates. The following list includes serving sizes of carbohydrate-rich foods that  provide 15 g ofcarbohydrate per serving:   1 slice of bread (1 oz) or 1 six-inch tortilla.    of a hamburger bun or English muffin.  4-6 crackers.   cup unsweetened dry cereal.    cup hot cereal.   cup rice or pasta.    cup mashed potatoes or  of a large baked potato.  1 cup fresh fruit or one small piece of fruit.    cup canned or frozen fruit or fruit juice.  1 cup milk.   cup plain fat-free yogurt or yogurt sweetened with artificial sweeteners.   cup cooked dried beans or starchy vegetable, such as peas, corn, or potatoes.  Decide the number of standard-size servings that you will eat. Multiply that number of servings by 15 (the grams of carbohydrates in that serving). For example, if you eat 2 cups of strawberries, you will have eaten 2 servings and 30 g of carbohydrates (2 servings x 15 g = 30 g). For foods such as soups and casseroles, in which more than one food is mixed in, you will need to count the carbohydrates in each food that is included. EXAMPLE OF CARBOHYDRATE COUNTING Sample Dinner  3 oz chicken breast.   cup of brown rice.   cup of corn.  1 cup milk.   1 cup strawberries with sugar-free whipped topping.  Carbohydrate Calculation Step   1: Identify the foods that contain carbohydrates:   Rice.   Corn.   Milk.   Strawberries. Step 2:Calculate the number of servings eaten of each:   2 servings of rice.   1 serving of corn.   1 serving of milk.   1 serving of strawberries. Step 3: Multiply each of those number of servings by 15 g:   2 servings of rice x 15 g = 30 g.   1 serving of corn x 15 g = 15 g.   1 serving of milk x 15 g = 15 g.   1 serving of strawberries x 15 g = 15 g. Step 4: Add together all of the amounts to find the total grams of carbohydrates eaten: 30 g + 15 g + 15 g + 15 g = 75 g.   This information is not intended to replace advice given to you by your health care provider. Make sure you  discuss any questions you have with your health care provider.   Document Released: 02/05/2005 Document Revised: 02/26/2014 Document Reviewed: 01/02/2013 Elsevier Interactive Patient Education 2016 Elsevier Inc.  

## 2014-12-30 NOTE — Progress Notes (Signed)
Patient ID: Anthony Vincent, male   DOB: [birth year], 44 y.o.   MRN: [id number]   Aleric Argenti, is a 44 y.o. male  [id number]  [id number]  DOB - Jan 26, 1971  Chief Complaint  Patient presents with  . Follow-up        Subjective:   Anthony Vincent is a 44 y.o. male with history of type 2 diabetes mellitus, hypertension, dyslipidemia and obesity here today for a follow up visit. He was seen here last month, blood sugar was up as well as hemoglobin A1c, medications were readjusted and patient to follow-up today with blood sugar logs. Logs showed blood sugar is ranging between 150 fasting to 285 random. Patient claims he is doing better with his diet, he wants to continue with that and also to intensify exercise effort. He has no complaints today. He has appointment with podiatrist this afternoon and he is going for eye exam very soon. Patient has No headache, No chest pain, No abdominal pain - No Nausea, No new weakness tingling or numbness, No Cough - SOB.  No problems updated.  ALLERGIES: No Known Allergies  PAST MEDICAL HISTORY: Past Medical History  Diagnosis Date  . Diabetes mellitus without complication ([place])   . Hyperlipidemia   . Hypertension     MEDICATIONS AT HOME: Prior to Admission medications   Medication Sig Start Date End Date Taking? Authorizing Provider  amLODipine (NORVASC) 10 MG tablet Take 1 tablet (10 mg total) by mouth daily. 12/02/14  Yes Tresa Garter, MD  canagliflozin (INVOKANA) 100 MG TABS tablet Take 1 tablet (100 mg total) by mouth daily. 12/02/14  Yes Tresa Garter, MD  glipiZIDE (GLIPIZIDE XL) 10 MG 24 hr tablet Take 1 tablet (10 mg total) by mouth daily with breakfast. 12/02/14  Yes Tresa Garter, MD  lisinopril-hydrochlorothiazide (PRINZIDE,ZESTORETIC) 20-12.5 MG tablet Take 1 tablet by mouth daily. 12/02/14  Yes Tresa Garter, MD  metFORMIN (GLUCOPHAGE) 1000 MG tablet Take 1 tablet (1,000 mg total) by mouth 2 (two) times  daily with a meal. 12/02/14  Yes Tresa Garter, MD  simvastatin (ZOCOR) 40 MG tablet Take 1 tablet (40 mg total) by mouth daily at 6 PM. 12/02/14  Yes Daemian Gahm E Doreene Burke, MD  gabapentin (NEURONTIN) 100 MG capsule Take 1 capsule (100 mg total) by mouth 3 (three) times daily. Patient not taking: Reported on 12/30/2014 12/02/14   Tresa Garter, MD     Objective:   Filed Vitals:   12/30/14 0917  BP: 118/78  Pulse: 90  Temp: 98.4 F (36.9 C)  TempSrc: Oral  Resp: 20  Height: 6' (1.829 m)  Weight: 286 lb 6.4 oz (129.91 kg)  SpO2: 100%    Exam General appearance : Awake, alert, not in any distress. Speech Clear. Not toxic looking, obese HEENT: Atraumatic and Normocephalic, pupils equally reactive to light and accomodation Neck: supple, no JVD. No cervical lymphadenopathy.  Chest:Good air entry bilaterally, no added sounds  CVS: S1 S2 regular, no murmurs.  Abdomen: Bowel sounds present, Non tender and not distended with no gaurding, rigidity or rebound. Extremities: B/L Lower Ext shows no edema, both legs are warm to touch Neurology: Awake alert, and oriented X 3, CN II-XII intact, Non focal  Data Review Lab Results  Component Value Date   HGBA1C 10.90 12/02/2014   HGBA1C 9.7 08/18/2013   HGBA1C 9.9 02/02/2013     Assessment & Plan   1. Type 2 diabetes mellitus without complication, without long-term current use of insulin (HCC)  -  Glucose (CBG)  Aim for 30 minutes of exercise most days. Rethink what you drink. Water is great! Aim for 2-3 Carb Choices per meal (30-45 grams) +/- 1 either way  Aim for 0-15 Carbs per snack if hungry  Include protein in moderation with your meals and snacks  Consider reading food labels for Total Carbohydrate and Fat Grams of foods  Consider checking BG at alternate times per day  Continue taking medication as directed Be mindful about how much sugar you are adding to beverages and other foods. Fruit Punch - find one with no  sugar  Measure and decrease portions of carbohydrate foods  Make your plate and don't go back for seconds  2. Dyslipidemia  To address this please limit saturated fat to no more than 7% of your calories, limit cholesterol to 200 mg/day, increase fiber and exercise as tolerated. If needed we may add another cholesterol lowering medication to your regimen.   3. Essential hypertension, benign  We have discussed target BP range and blood pressure goal. I have advised patient to check BP regularly and to call us back or report to clinic if the numbers are consistently higher than 140/90. We discussed the importance of compliance with medical therapy and DASH diet recommended, consequences of uncontrolled hypertension discussed.   - continue current BP medications  Patient have been counseled extensively about nutrition and exercise  Return in about 2 months (around 03/01/2015) for Hemoglobin A1C and Follow up, DM, Follow up HTN.  The patient was given clear instructions to go to ER or return to medical center if symptoms don't improve, worsen or new problems develop. The patient verbalized understanding. The patient was told to call to get lab results if they haven't heard anything in the next week.   This note has been created with Surveyor, quantity. Any transcriptional errors are unintentional.    Angelica Chessman, MD, [place], [place], [place] and [place], [place]   12/30/2014, 9:36 AM

## 2015-01-02 NOTE — Progress Notes (Signed)
Subjective:     Patient ID: Anthony Vincent, male   DOB: 1970/11/05, 44 y.o.   MRN: [id number]  HPI patient presents stating that he is getting itching on the bottom of his feet and he has thick nails that can bother him and pinching between his toes. He does have long-term diabetes   Review of Systems  All other systems reviewed and are negative.      Objective:   Physical Exam  Constitutional: He is oriented to person, place, and time.  Cardiovascular: Intact distal pulses.   Musculoskeletal: Normal range of motion.  Neurological: He is oriented to person, place, and time.  Skin: Skin is warm and dry.  Nursing note and vitals reviewed.  neurovascular status was found to be intact with mild diminishment of proprioception. Patient's noted to have severely thickened nailbeds 1-5 of both feet that are E long gaited and impossible for the patient to cut. Patient is noted to have irritation between the digits bilateral and is noted to have severely thickened bed that are painful. The infection does extend into the interdigital space but there is no breakdown of tissue     Assessment:     Mycotic nail infections 1-5 both feet with pain along with interdigital fungal infection    Plan:     H&P and diabetic education rendered to patient. Today I debrided nailbeds 1-5 both feet and smoothed and advised on routine care in the future and placed him on Lamisil to help with his skin infection. He will be seen back for routine care or earlier if any issues should appear

## 2015-04-01 ENCOUNTER — Ambulatory Visit (INDEPENDENT_AMBULATORY_CARE_PROVIDER_SITE_OTHER): Payer: BC Managed Care – PPO | Admitting: Podiatry

## 2015-04-01 DIAGNOSIS — B351 Tinea unguium: Secondary | ICD-10-CM

## 2015-04-01 DIAGNOSIS — M79606 Pain in leg, unspecified: Secondary | ICD-10-CM

## 2015-04-04 NOTE — Progress Notes (Signed)
Patient ID: Anthony Vincent, male   DOB: [birth year], 45 y.o.   MRN: [id number]  Subjective: 45 y.o. returns the office today for painful, elongated, thickened toenails which he cannot trim himself. Denies any redness or drainage around the nails. He has about finished with his course of Lamisil which she has been on for last 3 months. He feels that the Lamisil has significant cleared of his skin infection. Nails are somewhat improved. Denies any acute changes since last appointment and no new complaints today. Denies any systemic complaints such as fevers, chills, nausea, vomiting.   Objective: AAO 3, NAD DP/PT pulses palpable, CRT less than 3 seconds Nails hypertrophic, dystrophic, elongated, brittle, discolored 10. There is tenderness overlying the nails 1-5 bilaterally. There is no surrounding erythema or drainage along the nail sites. There does appear to be some mild clearing of the toenails on the proximal nail border however the nail still remain hypertrophic and dystrophic and discolored distally. No open lesions or pre-ulcerative lesions are identified. No evidence of tinea pedis this time. No other areas of tenderness bilateral lower extremities. No overlying edema, erythema, increased warmth. No pain with calf compression, swelling, warmth, erythema.  Assessment: Patient presents with symptomatic onychomycosis  Plan: -Treatment options including alternatives, risks, complications were discussed -Nails sharply debrided 10 without complication/bleeding. Hemostasis course Lamisil without any side effects. -Discussed daily foot inspection. If there are any changes, to call the office immediately.  -Follow-up in 3 months or sooner if any problems are to arise. In the meantime, encouraged to call the office with any questions, concerns, changes symptoms.  Celesta Gentile, DPM

## 2015-04-14 ENCOUNTER — Ambulatory Visit: Payer: BC Managed Care – PPO | Attending: Internal Medicine | Admitting: Internal Medicine

## 2015-04-14 ENCOUNTER — Encounter: Payer: Self-pay | Admitting: Internal Medicine

## 2015-04-14 VITALS — BP 132/75 | HR 85 | Temp 98.2°F | Resp 18 | Ht 72.0 in | Wt 294.0 lb

## 2015-04-14 DIAGNOSIS — E785 Hyperlipidemia, unspecified: Secondary | ICD-10-CM | POA: Diagnosis not present

## 2015-04-14 DIAGNOSIS — E119 Type 2 diabetes mellitus without complications: Secondary | ICD-10-CM | POA: Diagnosis not present

## 2015-04-14 DIAGNOSIS — Z7984 Long term (current) use of oral hypoglycemic drugs: Secondary | ICD-10-CM | POA: Insufficient documentation

## 2015-04-14 DIAGNOSIS — I10 Essential (primary) hypertension: Secondary | ICD-10-CM | POA: Diagnosis not present

## 2015-04-14 DIAGNOSIS — Z79899 Other long term (current) drug therapy: Secondary | ICD-10-CM | POA: Diagnosis not present

## 2015-04-14 LAB — POCT GLYCOSYLATED HEMOGLOBIN (HGB A1C): Hemoglobin A1C: 9

## 2015-04-14 LAB — GLUCOSE, POCT (MANUAL RESULT ENTRY): POC GLUCOSE: 193 mg/dL — AB (ref 70–99)

## 2015-04-14 MED ORDER — CANAGLIFLOZIN 100 MG PO TABS: 100.0000 mg | ORAL_TABLET | Freq: Every day | ORAL | Status: DC

## 2015-04-14 NOTE — Progress Notes (Signed)
Patient ID: Anthony Vincent, male   DOB: 1971/01/31, 45 y.o.   MRN: [id number]   Grason Drawhorn, is a 45 y.o. male  [id number]  [id number]  DOB - 02-01-1971  Chief Complaint  Patient presents with  . Follow-up    DM        Subjective:   Anthony Vincent is a 45 y.o. male with history of type 2 diabetes mellitus without complications, hyperlipidemia and hypertension here today for a follow up visit and medication refills. Blood sugars better controlled, patient is now eating mostly vegetable and other diabetic diets but he continues to struggle with soda. Patient is compliant with medications, needs refill on [place] today. He reports no side effects. He has no complaints. Patient is up-to-date with his eye exam, immunizations and podiatrist evaluation. Patient has No headache, No chest pain, No abdominal pain - No Nausea, No new weakness tingling or numbness, No Cough - SOB.  No problems updated.  ALLERGIES: No Known Allergies  PAST MEDICAL HISTORY: Past Medical History  Diagnosis Date  . Diabetes mellitus without complication ([place])   . Hyperlipidemia   . Hypertension     MEDICATIONS AT HOME: Prior to Admission medications   Medication Sig Start Date End Date Taking? Authorizing Provider  amLODipine (NORVASC) 10 MG tablet Take 1 tablet (10 mg total) by mouth daily. 12/02/14  Yes Tresa Garter, MD  canagliflozin (INVOKANA) 100 MG TABS tablet Take 1 tablet (100 mg total) by mouth daily. 04/14/15  Yes Tresa Garter, MD  gabapentin (NEURONTIN) 100 MG capsule Take 1 capsule (100 mg total) by mouth 3 (three) times daily. 12/02/14  Yes Tresa Garter, MD  glipiZIDE (GLIPIZIDE XL) 10 MG 24 hr tablet Take 1 tablet (10 mg total) by mouth daily with breakfast. 12/02/14  Yes Tresa Garter, MD  lisinopril-hydrochlorothiazide (PRINZIDE,ZESTORETIC) 20-12.5 MG tablet Take 1 tablet by mouth daily. 12/02/14  Yes Tresa Garter, MD  metFORMIN (GLUCOPHAGE) 1000 MG  tablet Take 1 tablet (1,000 mg total) by mouth 2 (two) times daily with a meal. 12/02/14  Yes Tresa Garter, MD  simvastatin (ZOCOR) 40 MG tablet Take 1 tablet (40 mg total) by mouth daily at 6 PM. 12/02/14  Yes Vance Belcourt E Doreene Burke, MD  terbinafine (LAMISIL) 250 MG tablet Take 1 tablet (250 mg total) by mouth daily. Patient not taking: Reported on 04/14/2015 12/30/14   Wallene Huh, DPM     Objective:   Filed Vitals:   04/14/15 1206  BP: 132/75  Pulse: 85  Temp: 98.2 F (36.8 C)  TempSrc: Oral  Resp: 18  Height: 6' (1.829 m)  Weight: 294 lb (133.358 kg)  SpO2: 98%    Exam General appearance : Awake, alert, not in any distress. Speech Clear. Not toxic looking, obese HEENT: Atraumatic and Normocephalic, pupils equally reactive to light and accomodation Neck: supple, no JVD. No cervical lymphadenopathy.  Chest:Good air entry bilaterally, no added sounds  CVS: S1 S2 regular, no murmurs.  Abdomen: Bowel sounds present, Non tender and not distended with no gaurding, rigidity or rebound. Extremities: B/L Lower Ext shows no edema, both legs are warm to touch Neurology: Awake alert, and oriented X 3, CN II-XII intact, Non focal Skin:No Rash  Data Review Lab Results  Component Value Date   HGBA1C 9.0 04/14/2015   HGBA1C 10.90 12/02/2014   HGBA1C 9.7 08/18/2013     Assessment & Plan   1. Type 2 diabetes mellitus without complication, without long-term current use of insulin (HCC)  -  POCT A1C has come down to 9.0% from 10.9% in October 2016 - Glucose (CBG) Refill - canagliflozin (INVOKANA) 100 MG TABS tablet; Take 1 tablet (100 mg total) by mouth daily.  Dispense: 90 tablet; Refill: 3  Aim for 30 minutes of exercise most days. Rethink what you drink. Water is great! Aim for 2-3 Carb Choices per meal (30-45 grams) +/- 1 either way  Aim for 0-15 Carbs per snack if hungry  Include protein in moderation with your meals and snacks  Consider reading food labels for Total  Carbohydrate and Fat Grams of foods  Consider checking BG at alternate times per day  Continue taking medication as directed Be mindful about how much sugar you are adding to beverages and other foods. Fruit Punch - find one with no sugar  Measure and decrease portions of carbohydrate foods  Make your plate and don't go back for seconds  2. Essential hypertension, benign   We have discussed target BP range and blood pressure goal. I have advised patient to check BP regularly and to call us back or report to clinic if the numbers are consistently higher than 140/90. We discussed the importance of compliance with medical therapy and DASH diet recommended, consequences of uncontrolled hypertension discussed.   - continue current BP medications  3. Dyslipidemia  To address this please limit saturated fat to no more than 7% of your calories, limit cholesterol to 200 mg/day, increase fiber and exercise as tolerated. If needed we may add another cholesterol lowering medication to your regimen.   Patient have been counseled extensively about nutrition and exercise  Return in about 3 months (around 07/12/2015) for Hemoglobin A1C and Follow up, DM, Follow up HTN.  The patient was given clear instructions to go to ER or return to medical center if symptoms don't improve, worsen or new problems develop. The patient verbalized understanding. The patient was told to call to get lab results if they haven't heard anything in the next week.   This note has been created with Surveyor, quantity. Any transcriptional errors are unintentional.    Angelica Chessman, MD, [place], Karilyn Cota, [place] [facility] [place], [place]   04/14/2015, 12:41 PM

## 2015-04-14 NOTE — Progress Notes (Signed)
Patient is here for FU DM  Patient denies pain at this time.

## 2015-04-14 NOTE — Patient Instructions (Addendum)
Diabetes and Exercise Exercising regularly is important. It is not just about losing weight. It has many health benefits, such as:  Improving your overall fitness, flexibility, and endurance.  Increasing your bone density.  Helping with weight control.  Decreasing your body fat.  Increasing your muscle strength.  Reducing stress and tension.  Improving your overall health. People with diabetes who exercise gain additional benefits because exercise:  Reduces appetite.  Improves the body's use of blood sugar (glucose).  Helps lower or control blood glucose.  Decreases blood pressure.  Helps control blood lipids (such as cholesterol and triglycerides).  Improves the body's use of the hormone insulin by:  Increasing the body's insulin sensitivity.  Reducing the body's insulin needs.  Decreases the risk for heart disease because exercising:  Lowers cholesterol and triglycerides levels.  Increases the levels of good cholesterol (such as high-density lipoproteins [HDL]) in the body.  Lowers blood glucose levels. YOUR ACTIVITY PLAN  Choose an activity that you enjoy, and set realistic goals. To exercise safely, you should begin practicing any new physical activity slowly, and gradually increase the intensity of the exercise over time. Your health care provider or diabetes educator can help create an activity plan that works for you. General recommendations include:  Encouraging children to engage in at least 60 minutes of physical activity each day.  Stretching and performing strength training exercises, such as yoga or weight lifting, at least 2 times per week.  Performing a total of at least 150 minutes of moderate-intensity exercise each week, such as brisk walking or water aerobics.  Exercising at least 3 days per week, making sure you allow no more than 2 consecutive days to pass without exercising.  Avoiding long periods of inactivity (90 minutes or more). When you  have to spend an extended period of time sitting down, take frequent breaks to walk or stretch. RECOMMENDATIONS FOR EXERCISING WITH TYPE 1 OR TYPE 2 DIABETES   Check your blood glucose before exercising. If blood glucose levels are greater than 240 mg/dL, check for urine ketones. Do not exercise if ketones are present.  Avoid injecting insulin into areas of the body that are going to be exercised. For example, avoid injecting insulin into:  The arms when playing tennis.  The legs when jogging.  Keep a record of:  Food intake before and after you exercise.  Expected peak times of insulin action.  Blood glucose levels before and after you exercise.  The type and amount of exercise you have done.  Review your records with your health care provider. Your health care provider will help you to develop guidelines for adjusting food intake and insulin amounts before and after exercising.  If you take insulin or oral hypoglycemic agents, watch for signs and symptoms of hypoglycemia. They include:  Dizziness.  Shaking.  Sweating.  Chills.  Confusion.  Drink plenty of water while you exercise to prevent dehydration or heat stroke. Body water is lost during exercise and must be replaced.  Talk to your health care provider before starting an exercise program to make sure it is safe for you. Remember, almost any type of activity is better than none.   This information is not intended to replace advice given to you by your health care provider. Make sure you discuss any questions you have with your health care provider.   Document Released: 04/28/2003 Document Revised: 06/22/2014 Document Reviewed: 07/15/2012 Elsevier Interactive Patient Education 2016 [employer] Basic Carbohydrate Counting for Diabetes Mellitus Carbohydrate counting   is a method for keeping track of the amount of carbohydrates you eat. Eating carbohydrates naturally increases the level of sugar (glucose) in your  blood, so it is important for you to know the amount that is okay for you to have in every meal. Carbohydrate counting helps keep the level of glucose in your blood within normal limits. The amount of carbohydrates allowed is different for every person. A dietitian can help you calculate the amount that is right for you. Once you know the amount of carbohydrates you can have, you can count the carbohydrates in the foods you want to eat. Carbohydrates are found in the following foods:  Grains, such as breads and cereals.  Dried beans and soy products.  Starchy vegetables, such as potatoes, peas, and corn.  Fruit and fruit juices.  Milk and yogurt.  Sweets and snack foods, such as cake, cookies, candy, chips, soft drinks, and fruit drinks. CARBOHYDRATE COUNTING There are two ways to count the carbohydrates in your food. You can use either of the methods or a combination of both. Reading the "Nutrition Facts" on Packaged Food The "Nutrition Facts" is an area that is included on the labels of almost all packaged food and beverages in the [place]. It includes the serving size of that food or beverage and information about the nutrients in each serving of the food, including the grams (g) of carbohydrate per serving.  Decide the number of servings of this food or beverage that you will be able to eat or drink. Multiply that number of servings by the number of grams of carbohydrate that is listed on the label for that serving. The total will be the amount of carbohydrates you will be having when you eat or drink this food or beverage. Learning Standard Serving Sizes of Food When you eat food that is not packaged or does not include "Nutrition Facts" on the label, you need to measure the servings in order to count the amount of carbohydrates.A serving of most carbohydrate-rich foods contains about 15 g of carbohydrates. The following list includes serving sizes of carbohydrate-rich foods that  provide 15 g ofcarbohydrate per serving:   1 slice of bread (1 oz) or 1 six-inch tortilla.    of a hamburger bun or English muffin.  4-6 crackers.   cup unsweetened dry cereal.    cup hot cereal.   cup rice or pasta.    cup mashed potatoes or  of a large baked potato.  1 cup fresh fruit or one small piece of fruit.    cup canned or frozen fruit or fruit juice.  1 cup milk.   cup plain fat-free yogurt or yogurt sweetened with artificial sweeteners.   cup cooked dried beans or starchy vegetable, such as peas, corn, or potatoes.  Decide the number of standard-size servings that you will eat. Multiply that number of servings by 15 (the grams of carbohydrates in that serving). For example, if you eat 2 cups of strawberries, you will have eaten 2 servings and 30 g of carbohydrates (2 servings x 15 g = 30 g). For foods such as soups and casseroles, in which more than one food is mixed in, you will need to count the carbohydrates in each food that is included. EXAMPLE OF CARBOHYDRATE COUNTING Sample Dinner  3 oz chicken breast.   cup of brown rice.   cup of corn.  1 cup milk.   1 cup strawberries with sugar-free whipped topping.  Carbohydrate Calculation Step   1: Identify the foods that contain carbohydrates:   Rice.   Corn.   Milk.   Strawberries. Step 2:Calculate the number of servings eaten of each:   2 servings of rice.   1 serving of corn.   1 serving of milk.   1 serving of strawberries. Step 3: Multiply each of those number of servings by 15 g:   2 servings of rice x 15 g = 30 g.   1 serving of corn x 15 g = 15 g.   1 serving of milk x 15 g = 15 g.   1 serving of strawberries x 15 g = 15 g. Step 4: Add together all of the amounts to find the total grams of carbohydrates eaten: 30 g + 15 g + 15 g + 15 g = 75 g.   This information is not intended to replace advice given to you by your health care provider. Make sure you  discuss any questions you have with your health care provider.   Document Released: 02/05/2005 Document Revised: 02/26/2014 Document Reviewed: 01/02/2013 Elsevier Interactive Patient Education 2016 Elsevier Inc[employer] DASH Eating Plan DASH stands for "Dietary Approaches to Stop Hypertension." The DASH eating plan is a healthy eating plan that has been shown to reduce high blood pressure (hypertension). Additional health benefits may include reducing the risk of type 2 diabetes mellitus, heart disease, and stroke. The DASH eating plan may also help with weight loss. WHAT DO I NEED TO KNOW ABOUT THE DASH EATING PLAN? For the DASH eating plan, you will follow these general guidelines:  Choose foods with a percent daily value for sodium of less than 5% (as listed on the food label).  Use salt-free seasonings or herbs instead of table salt or sea salt.  Check with your health care provider or pharmacist before using salt substitutes.  Eat lower-sodium products, often labeled as "lower sodium" or "no salt added."  Eat fresh foods.  Eat more vegetables, fruits, and low-fat dairy products.  Choose whole grains. Look for the word "whole" as the first word in the ingredient list.  Choose fish and skinless chicken or turkey more often than red meat. Limit fish, poultry, and meat to 6 oz (170 g) each day.  Limit sweets, desserts, sugars, and sugary drinks.  Choose heart-healthy fats.  Limit cheese to 1 oz (28 g) per day.  Eat more home-cooked food and less restaurant, buffet, and fast food.  Limit fried foods.  Cook foods using methods other than frying.  Limit canned vegetables. If you do use them, rinse them well to decrease the sodium.  When eating at a restaurant, ask that your food be prepared with less salt, or no salt if possible. WHAT FOODS CAN I EAT? Seek help from a dietitian for individual calorie needs. Grains Whole grain or whole wheat bread. Brown rice. Whole grain or whole  wheat pasta. Quinoa, bulgur, and whole grain cereals. Low-sodium cereals. Corn or whole wheat flour tortillas. Whole grain cornbread. Whole grain crackers. Low-sodium crackers. Vegetables Fresh or frozen vegetables (raw, steamed, roasted, or grilled). Low-sodium or reduced-sodium tomato and vegetable juices. Low-sodium or reduced-sodium tomato sauce and paste. Low-sodium or reduced-sodium canned vegetables.  Fruits All fresh, canned (in natural juice), or frozen fruits. Meat and Other Protein Products Ground beef (85% or leaner), grass-fed beef, or beef trimmed of fat. Skinless chicken or turkey. Ground chicken or turkey. Pork trimmed of fat. All fish and seafood. Eggs. Dried beans, peas, or lentils. Unsalted nuts   and seeds. Unsalted canned beans. Dairy Low-fat dairy products, such as skim or 1% milk, 2% or reduced-fat cheeses, low-fat ricotta or cottage cheese, or plain low-fat yogurt. Low-sodium or reduced-sodium cheeses. Fats and Oils Tub margarines without trans fats. Light or reduced-fat mayonnaise and salad dressings (reduced sodium). Avocado. Safflower, olive, or canola oils. Natural peanut or almond butter. Other Unsalted popcorn and pretzels. The items listed above may not be a complete list of recommended foods or beverages. Contact your dietitian for more options. WHAT FOODS ARE NOT RECOMMENDED? Grains White bread. White pasta. White rice. Refined cornbread. Bagels and croissants. Crackers that contain trans fat. Vegetables Creamed or fried vegetables. Vegetables in a cheese sauce. Regular canned vegetables. Regular canned tomato sauce and paste. Regular tomato and vegetable juices. Fruits Dried fruits. Canned fruit in light or heavy syrup. Fruit juice. Meat and Other Protein Products Fatty cuts of meat. Ribs, chicken wings, bacon, sausage, bologna, salami, chitterlings, fatback, hot dogs, bratwurst, and packaged luncheon meats. Salted nuts and seeds. Canned beans with  salt. Dairy Whole or 2% milk, cream, half-and-half, and cream cheese. Whole-fat or sweetened yogurt. Full-fat cheeses or blue cheese. Nondairy creamers and whipped toppings. Processed cheese, cheese spreads, or cheese curds. Condiments Onion and garlic salt, seasoned salt, table salt, and sea salt. Canned and packaged gravies. Worcestershire sauce. Tartar sauce. Barbecue sauce. Teriyaki sauce. Soy sauce, including reduced sodium. Steak sauce. Fish sauce. Oyster sauce. Cocktail sauce. Horseradish. Ketchup and mustard. Meat flavorings and tenderizers. Bouillon cubes. Hot sauce. Tabasco sauce. Marinades. Taco seasonings. Relishes. Fats and Oils Butter, stick margarine, lard, shortening, ghee, and bacon fat. Coconut, palm kernel, or palm oils. Regular salad dressings. Other Pickles and olives. Salted popcorn and pretzels. The items listed above may not be a complete list of foods and beverages to avoid. Contact your dietitian for more information. WHERE CAN I FIND MORE INFORMATION? National Heart, Lung, and [employer] Institute: www.nhlbi.nih.gov/health/health-topics/topics/dash/   This information is not intended to replace advice given to you by your health care provider. Make sure you discuss any questions you have with your health care provider.   Document Released: 01/25/2011 Document Revised: 02/26/2014 Document Reviewed: 12/10/2012 Elsevier Interactive Patient Education 2016 Elsevier Inc. Hypertension Hypertension, commonly called high blood pressure, is when the force of blood pumping through your arteries is too strong. Your arteries are the blood vessels that carry blood from your heart throughout your body. A blood pressure reading consists of a higher number over a lower number, such as 110/72. The higher number (systolic) is the pressure inside your arteries when your heart pumps. The lower number (diastolic) is the pressure inside your arteries when your heart relaxes. Ideally you want your  blood pressure below 120/80. Hypertension forces your heart to work harder to pump blood. Your arteries may become narrow or stiff. Having untreated or uncontrolled hypertension can cause heart attack, stroke, kidney disease, and other problems. RISK FACTORS Some risk factors for high blood pressure are controllable. Others are not.  Risk factors you cannot control include:   Race. You may be at higher risk if you are African American.  Age. Risk increases with age.  Gender. Men are at higher risk than women before age 45 years. After age 65, women are at higher risk than men. Risk factors you can control include:  Not getting enough exercise or physical activity.  Being overweight.  Getting too much fat, sugar, calories, or salt in your diet.  Drinking too much alcohol. SIGNS AND SYMPTOMS Hypertension does   not usually cause signs or symptoms. Extremely high blood pressure (hypertensive crisis) may cause headache, anxiety, shortness of breath, and nosebleed. DIAGNOSIS To check if you have hypertension, your health care provider will measure your blood pressure while you are seated, with your arm held at the level of your heart. It should be measured at least twice using the same arm. Certain conditions can cause a difference in blood pressure between your right and left arms. A blood pressure reading that is higher than normal on one occasion does not mean that you need treatment. If it is not clear whether you have high blood pressure, you may be asked to return on a different day to have your blood pressure checked again. Or, you may be asked to monitor your blood pressure at home for 1 or more weeks. TREATMENT Treating high blood pressure includes making lifestyle changes and possibly taking medicine. Living a healthy lifestyle can help lower high blood pressure. You may need to change some of your habits. Lifestyle changes may include:  Following the DASH diet. This diet is high in  fruits, vegetables, and whole grains. It is low in salt, red meat, and added sugars.  Keep your sodium intake below 2,300 mg per day.  Getting at least 30-45 minutes of aerobic exercise at least 4 times per week.  Losing weight if necessary.  Not smoking.  Limiting alcoholic beverages.  Learning ways to reduce stress. Your health care provider may prescribe medicine if lifestyle changes are not enough to get your blood pressure under control, and if one of the following is true:  You are 18-59 years of age and your systolic blood pressure is above 140.  You are 60 years of age or older, and your systolic blood pressure is above 150.  Your diastolic blood pressure is above 90.  You have diabetes, and your systolic blood pressure is over 140 or your diastolic blood pressure is over 90.  You have kidney disease and your blood pressure is above 140/90.  You have heart disease and your blood pressure is above 140/90. Your personal target blood pressure may vary depending on your medical conditions, your age, and other factors. HOME CARE INSTRUCTIONS  Have your blood pressure rechecked as directed by your health care provider.   Take medicines only as directed by your health care provider. Follow the directions carefully. Blood pressure medicines must be taken as prescribed. The medicine does not work as well when you skip doses. Skipping doses also puts you at risk for problems.  Do not smoke.   Monitor your blood pressure at home as directed by your health care provider. SEEK MEDICAL CARE IF:   You think you are having a reaction to medicines taken.  You have recurrent headaches or feel dizzy.  You have swelling in your ankles.  You have trouble with your vision. SEEK IMMEDIATE MEDICAL CARE IF:  You develop a severe headache or confusion.  You have unusual weakness, numbness, or feel faint.  You have severe chest or abdominal pain.  You vomit repeatedly.  You  have trouble breathing. MAKE SURE YOU:   Understand these instructions.  Will watch your condition.  Will get help right away if you are not doing well or get worse.   This information is not intended to replace advice given to you by your health care provider. Make sure you discuss any questions you have with your health care provider.   Document Released: 02/05/2005 Document Revised: 06/22/2014 Document   Reviewed: 11/28/2012 Elsevier Interactive Patient Education 2016 Elsevier Inc.  

## 2015-06-30 ENCOUNTER — Emergency Department (HOSPITAL_COMMUNITY)
Admission: EM | Admit: 2015-06-30 | Discharge: 2015-06-30 | Disposition: A | Discharge status: Home / Self Care | Payer: BC Managed Care – PPO | Attending: Emergency Medicine | Admitting: Emergency Medicine

## 2015-06-30 ENCOUNTER — Encounter (HOSPITAL_COMMUNITY): Payer: Self-pay | Admitting: Emergency Medicine

## 2015-06-30 DIAGNOSIS — E119 Type 2 diabetes mellitus without complications: Secondary | ICD-10-CM | POA: Insufficient documentation

## 2015-06-30 DIAGNOSIS — S161XXA Strain of muscle, fascia and tendon at neck level, initial encounter: Secondary | ICD-10-CM | POA: Insufficient documentation

## 2015-06-30 DIAGNOSIS — Z79899 Other long term (current) drug therapy: Secondary | ICD-10-CM | POA: Diagnosis not present

## 2015-06-30 DIAGNOSIS — I10 Essential (primary) hypertension: Secondary | ICD-10-CM | POA: Insufficient documentation

## 2015-06-30 DIAGNOSIS — Y92410 Unspecified street and highway as the place of occurrence of the external cause: Secondary | ICD-10-CM | POA: Diagnosis not present

## 2015-06-30 DIAGNOSIS — Y999 Unspecified external cause status: Secondary | ICD-10-CM | POA: Diagnosis not present

## 2015-06-30 DIAGNOSIS — S46912A Strain of unspecified muscle, fascia and tendon at shoulder and upper arm level, left arm, initial encounter: Secondary | ICD-10-CM | POA: Insufficient documentation

## 2015-06-30 DIAGNOSIS — Z7984 Long term (current) use of oral hypoglycemic drugs: Secondary | ICD-10-CM | POA: Insufficient documentation

## 2015-06-30 DIAGNOSIS — E785 Hyperlipidemia, unspecified: Secondary | ICD-10-CM | POA: Diagnosis not present

## 2015-06-30 DIAGNOSIS — T148XXA Other injury of unspecified body region, initial encounter: Secondary | ICD-10-CM

## 2015-06-30 DIAGNOSIS — Y939 Activity, unspecified: Secondary | ICD-10-CM | POA: Diagnosis not present

## 2015-06-30 DIAGNOSIS — S199XXA Unspecified injury of neck, initial encounter: Secondary | ICD-10-CM | POA: Diagnosis present

## 2015-06-30 MED ORDER — IBUPROFEN 600 MG PO TABS: 600.0000 mg | ORAL_TABLET | Freq: Three times a day (TID) | ORAL | Status: DC | PRN

## 2015-06-30 MED ORDER — METHOCARBAMOL 500 MG PO TABS: 1000.0000 mg | ORAL_TABLET | Freq: Four times a day (QID) | ORAL | Status: DC | PRN
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
## 2015-06-30 NOTE — ED Notes (Signed)
MD at bedside. 
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
## 2015-06-30 NOTE — Discharge Instructions (Signed)

## 2015-06-30 NOTE — ED Notes (Signed)
Pt states he did not take his BP meds today.

## 2015-07-08 ENCOUNTER — Ambulatory Visit: Payer: BC Managed Care – PPO | Admitting: Podiatry

## 2015-10-20 ENCOUNTER — Ambulatory Visit: Payer: BC Managed Care – PPO | Attending: Internal Medicine | Admitting: Physician Assistant

## 2015-10-20 ENCOUNTER — Encounter: Payer: Self-pay | Admitting: Physician Assistant

## 2015-10-20 VITALS — BP 131/86 | HR 79 | Temp 98.2°F | Wt 293.8 lb

## 2015-10-20 DIAGNOSIS — Z23 Encounter for immunization: Secondary | ICD-10-CM

## 2015-10-20 DIAGNOSIS — E0800 Diabetes mellitus due to underlying condition with hyperosmolarity without nonketotic hyperglycemic-hyperosmolar coma (NKHHC): Secondary | ICD-10-CM

## 2015-10-20 DIAGNOSIS — K130 Diseases of lips: Secondary | ICD-10-CM | POA: Diagnosis not present

## 2015-10-20 DIAGNOSIS — R220 Localized swelling, mass and lump, head: Secondary | ICD-10-CM

## 2015-10-20 DIAGNOSIS — I10 Essential (primary) hypertension: Secondary | ICD-10-CM

## 2015-10-20 DIAGNOSIS — L723 Sebaceous cyst: Secondary | ICD-10-CM

## 2015-10-20 LAB — GLUCOSE, POCT (MANUAL RESULT ENTRY): POC GLUCOSE: 154 mg/dL — AB (ref 70–99)

## 2015-10-20 LAB — POCT GLYCOSYLATED HEMOGLOBIN (HGB A1C): HEMOGLOBIN A1C: 9.4

## 2015-10-20 NOTE — Progress Notes (Signed)
Pt states he is here today for the following:  Swelling - bottom lip x 6 dys Pt denies any pain, bumps or itching  Medication refill: Amlodipine and Lisinopril HCTZ  Follow up: DM  Pt is fasting for lab work. Pt states he took his last blood pressure medication yesterday.

## 2015-10-20 NOTE — Patient Instructions (Addendum)
Zyrtec 10 mg 1 daily for 2 weeks  Check Blood sugars at least every other day in the morning.  Work diligently to cut sugars out of your diet.  Increase exercise.  Influenza (Flu) Vaccine (Inactivated or Recombinant):   1. Why get vaccinated? Influenza ("flu") is a contagious disease that spreads around the [place] every year, usually between October and May. Flu is caused by influenza viruses, and is spread mainly by coughing, sneezing, and close contact. Anyone can get flu. Flu strikes suddenly and can last several days. Symptoms vary by age, but can include:  fever/chills  sore throat  muscle aches  fatigue  cough  headache  runny or stuffy nose Flu can also lead to pneumonia and blood infections, and cause diarrhea and seizures in children. If you have a medical condition, such as heart or lung disease, flu can make it worse. Flu is more dangerous for some people. Infants and young children, people 47 years of age and older, pregnant women, and people with certain health conditions or a weakened immune system are at greatest risk. Each year thousands of people in the [place] die from flu, and many more are hospitalized. Flu vaccine can:  keep you from getting flu,  make flu less severe if you do get it, and  keep you from spreading flu to your family and other people. 2. Inactivated and recombinant flu vaccines A dose of flu vaccine is recommended every flu season. Children 6 months through 38 years of age may need two doses during the same flu season. Everyone else needs only one dose each flu season. Some inactivated flu vaccines contain a very small amount of a mercury-based preservative called thimerosal. Studies have not shown thimerosal in vaccines to be harmful, but flu vaccines that do not contain thimerosal are available. There is no live flu virus in flu shots. They cannot cause the flu. There are many flu viruses, and they are always changing. Each year  a new flu vaccine is made to protect against three or four viruses that are likely to cause disease in the upcoming flu season. But even when the vaccine doesn't exactly match these viruses, it may still provide some protection. Flu vaccine cannot prevent:  flu that is caused by a virus not covered by the vaccine, or  illnesses that look like flu but are not. It takes about 2 weeks for protection to develop after vaccination, and protection lasts through the flu season. 3. Some people should not get this vaccine Tell the person who is giving you the vaccine:  If you have any severe, life-threatening allergies. If you ever had a life-threatening allergic reaction after a dose of flu vaccine, or have a severe allergy to any part of this vaccine, you may be advised not to get vaccinated. Most, but not all, types of flu vaccine contain a small amount of egg protein.  If you ever had Guillain-Barre Syndrome (also called GBS). Some people with a history of GBS should not get this vaccine. This should be discussed with your doctor.  If you are not feeling well. It is usually okay to get flu vaccine when you have a mild illness, but you might be asked to come back when you feel better. 4. Risks of a vaccine reaction With any medicine, including vaccines, there is a chance of reactions. These are usually mild and go away on their own, but serious reactions are also possible. Most people who get a flu shot  do not have any problems with it. Minor problems following a flu shot include:  soreness, redness, or swelling where the shot was given  hoarseness  sore, red or itchy eyes  cough  fever  aches  headache  itching  fatigue If these problems occur, they usually begin soon after the shot and last 1 or 2 days. More serious problems following a flu shot can include the following:  There may be a small increased risk of Guillain-Barre Syndrome (GBS) after inactivated flu vaccine. This risk  has been estimated at 1 or 2 additional cases per million people vaccinated. This is much lower than the risk of severe complications from flu, which can be prevented by flu vaccine.  Young children who get the flu shot along with pneumococcal vaccine (PCV13) and/or DTaP vaccine at the same time might be slightly more likely to have a seizure caused by fever. Ask your doctor for more information. Tell your doctor if a child who is getting flu vaccine has ever had a seizure. Problems that could happen after any injected vaccine:  People sometimes faint after a medical procedure, including vaccination. Sitting or lying down for about 15 minutes can help prevent fainting, and injuries caused by a fall. Tell your doctor if you feel dizzy, or have vision changes or ringing in the ears.  Some people get severe pain in the shoulder and have difficulty moving the arm where a shot was given. This happens very rarely.  Any medication can cause a severe allergic reaction. Such reactions from a vaccine are very rare, estimated at about 1 in a million doses, and would happen within a few minutes to a few hours after the vaccination. As with any medicine, there is a very remote chance of a vaccine causing a serious injury or death. The safety of vaccines is always being monitored. For more information, visit: http://www.aguilar.org/ 5. What if there is a serious reaction? What should I look for?  Look for anything that concerns you, such as signs of a severe allergic reaction, very high fever, or unusual behavior. Signs of a severe allergic reaction can include hives, swelling of the face and throat, difficulty breathing, a fast heartbeat, dizziness, and weakness. These would start a few minutes to a few hours after the vaccination. What should I do?  If you think it is a severe allergic reaction or other emergency that can't wait, call 9-1-1 and get the person to the nearest hospital. Otherwise, call your  doctor.  Reactions should be reported to the Vaccine Adverse Event Reporting System (VAERS). Your doctor should file this report, or you can do it yourself through the VAERS web site at www.vaers.SamedayNews.es, or by calling 814-656-5037. VAERS does not give medical advice. 6. The National Vaccine Injury Compensation Program The [employer] Vaccine Injury Compensation Program (VICP) is a federal program that was created to compensate people who may have been injured by certain vaccines. Persons who believe they may have been injured by a vaccine can learn about the program and about filing a claim by calling 938-220-0712 or visiting the [place] website at GoldCloset.com.ee. There is a time limit to file a claim for compensation. 7. How can I learn more?  Ask your healthcare provider. He or she can give you the vaccine package insert or suggest other sources of information.  Call your local or state health department.  Contact the Centers for Disease Control and Prevention (CDC):  Call 224-037-7692 (1-800-CDC-INFO) or  Visit CDC's website at  https://gibson.com/ Vaccine Information Statement Inactivated Influenza Vaccine (09/25/2013)   This information is not intended to replace advice given to you by your health care provider. Make sure you discuss any questions you have with your health care provider.   Document Released: 11/30/2005 Document Revised: 02/26/2014 Document Reviewed: 09/28/2013 Elsevier Interactive Patient Education 2016 [place]. Epidermal Cyst An epidermal cyst is sometimes called a sebaceous cyst, epidermal inclusion cyst, or infundibular cyst. These cysts usually contain a substance that looks "pasty" or "cheesy" and may have a bad smell. This substance is a protein called keratin. Epidermal cysts are usually found on the face, neck, or trunk. They may also occur in the vaginal area or other parts of the genitalia of both men and women. Epidermal cysts are usually  small, painless, slow-growing bumps or lumps that move freely under the skin. It is important not to try to pop them. This may cause an infection and lead to tenderness and swelling. CAUSES  Epidermal cysts may be caused by a deep penetrating injury to the skin or a plugged hair follicle, often associated with acne. SYMPTOMS  Epidermal cysts can become inflamed and cause: Redness. Tenderness. Increased temperature of the skin over the bumps or lumps. Grayish-white, bad smelling material that drains from the bump or lump. DIAGNOSIS  Epidermal cysts are easily diagnosed by your caregiver during an exam. Rarely, a tissue sample (biopsy) may be taken to rule out other conditions that may resemble epidermal cysts. TREATMENT  Epidermal cysts often get better and disappear on their own. They are rarely ever cancerous. If a cyst becomes infected, it may become inflamed and tender. This may require opening and draining the cyst. Treatment with antibiotics may be necessary. When the infection is gone, the cyst may be removed with minor surgery. Small, inflamed cysts can often be treated with antibiotics or by injecting steroid medicines. Sometimes, epidermal cysts become large and bothersome. If this happens, surgical removal in your caregiver's office may be necessary. HOME CARE INSTRUCTIONS Only take over-the-counter or prescription medicines as directed by your caregiver. Take your antibiotics as directed. Finish them even if you start to feel better. SEEK MEDICAL CARE IF:  Your cyst becomes tender, red, or swollen. Your condition is not improving or is getting worse. You have any other questions or concerns. MAKE SURE YOU: Understand these instructions. Will watch your condition. Will get help right away if you are not doing well or get worse.   This information is not intended to replace advice given to you by your health care provider. Make sure you discuss any questions you have with your  health care provider.   Document Released: 01/07/2004 Document Revised: 04/30/2011 Document Reviewed: 08/14/2010 Elsevier Interactive Patient Education 2016 [place] Carbohydrate Counting for Diabetes Mellitus Carbohydrate counting is a method for keeping track of the amount of carbohydrates you eat. Eating carbohydrates naturally increases the level of sugar (glucose) in your blood, so it is important for you to know the amount that is okay for you to have in every meal. Carbohydrate counting helps keep the level of glucose in your blood within normal limits. The amount of carbohydrates allowed is different for every person. A dietitian can help you calculate the amount that is right for you. Once you know the amount of carbohydrates you can have, you can count the carbohydrates in the foods you want to eat. Carbohydrates are found in the following foods:  Grains, such as breads and cereals.  Dried beans and  soy products.  Starchy vegetables, such as potatoes, peas, and corn.  Fruit and fruit juices.  Milk and yogurt.  Sweets and snack foods, such as cake, cookies, candy, chips, soft drinks, and fruit drinks. CARBOHYDRATE COUNTING There are two ways to count the carbohydrates in your food. You can use either of the methods or a combination of both. Reading the "Nutrition Facts" on [place] The "Nutrition Facts" is an area that is included on the labels of almost all packaged food and beverages in the [place]. It includes the serving size of that food or beverage and information about the nutrients in each serving of the food, including the grams (g) of carbohydrate per serving.  Decide the number of servings of this food or beverage that you will be able to eat or drink. Multiply that number of servings by the number of grams of carbohydrate that is listed on the label for that serving. The total will be the amount of carbohydrates you will be having when you eat or  drink this food or beverage. Learning Standard Serving Sizes of Food When you eat food that is not packaged or does not include "Nutrition Facts" on the label, you need to measure the servings in order to count the amount of carbohydrates.A serving of most carbohydrate-rich foods contains about 15 g of carbohydrates. The following list includes serving sizes of carbohydrate-rich foods that provide 15 g ofcarbohydrate per serving:   1 slice of bread (1 oz) or 1 six-inch tortilla.    of a hamburger bun or English muffin.  4-6 crackers.   cup unsweetened dry cereal.    cup hot cereal.   cup rice or pasta.    cup mashed potatoes or  of a large baked potato.  1 cup fresh fruit or one small piece of fruit.    cup canned or frozen fruit or fruit juice.  1 cup milk.   cup plain fat-free yogurt or yogurt sweetened with artificial sweeteners.   cup cooked dried beans or starchy vegetable, such as peas, corn, or potatoes.  Decide the number of standard-size servings that you will eat. Multiply that number of servings by 15 (the grams of carbohydrates in that serving). For example, if you eat 2 cups of strawberries, you will have eaten 2 servings and 30 g of carbohydrates (2 servings x 15 g = 30 g). For foods such as soups and casseroles, in which more than one food is mixed in, you will need to count the carbohydrates in each food that is included. EXAMPLE OF CARBOHYDRATE COUNTING Sample Dinner  3 oz chicken breast.   cup of brown rice.   cup of corn.  1 cup milk.   1 cup strawberries with sugar-free whipped topping.  Carbohydrate Calculation Step 1: Identify the foods that contain carbohydrates:   Rice.   Corn.   Milk.   Strawberries. Step 2:Calculate the number of servings eaten of each:   2 servings of rice.   1 serving of corn.   1 serving of milk.   1 serving of strawberries. Step 3: Multiply each of those number of servings by 15 g:    2 servings of rice x 15 g = 30 g.   1 serving of corn x 15 g = 15 g.   1 serving of milk x 15 g = 15 g.   1 serving of strawberries x 15 g = 15 g. Step 4: Add together all of the amounts to find  the total grams of carbohydrates eaten: 30 g + 15 g + 15 g + 15 g = 75 g.   This information is not intended to replace advice given to you by your health care provider. Make sure you discuss any questions you have with your health care provider.   Document Released: 02/05/2005 Document Revised: 02/26/2014 Document Reviewed: 01/02/2013 Elsevier Interactive Patient Education [employer].

## 2015-10-20 NOTE — Progress Notes (Signed)
Anthony Vincent, is a 45 y.o. male  [id number]  [id number]  DOB - 1970-09-18  Subjective:  Chief Complaint and HPI: Anthony Vincent is a 45 y.o. male here today for swelling below his lower lip that has been happening for about 6 days.  Worse in am and better as the day progresses. No new meds.  No itching. Also c/o of "lump" under L arm for several months.  Non-tender.  Not causing him any problems.  He doesn't check his blood sugars regularly. He denies s/sx of hyper/hypoglycemia.     ROS:   Constitutional:  No f/c, No night sweats, No unexplained weight loss. EENT:  No vision changes, No blurry vision, No hearing changes. No mouth, throat, or ear problems.  Respiratory: No cough, No SOB Cardiac: No CP, no palpitations GI:  No abd pain, No N/V/D. GU: No Urinary s/sx Musculoskeletal: No joint pain Neuro: No headache, no dizziness, no motor weakness.  Skin: No rash Endocrine:  No polydipsia. No polyuria.  Psych: Denies SI/HI  No problems updated.  ALLERGIES: No Known Allergies  PAST MEDICAL HISTORY: Past Medical History:  Diagnosis Date  . Diabetes mellitus without complication ([place])   . Hyperlipidemia   . Hypertension     MEDICATIONS AT HOME: Prior to Admission medications   Medication Sig Start Date End Date Taking? Authorizing Provider  amLODipine (NORVASC) 10 MG tablet Take 1 tablet (10 mg total) by mouth daily. 12/02/14  Yes Tresa Garter, MD  canagliflozin (INVOKANA) 100 MG TABS tablet Take 1 tablet (100 mg total) by mouth daily. 04/14/15  Yes Tresa Garter, MD  gabapentin (NEURONTIN) 100 MG capsule Take 1 capsule (100 mg total) by mouth 3 (three) times daily. 12/02/14  Yes Tresa Garter, MD  glipiZIDE (GLIPIZIDE XL) 10 MG 24 hr tablet Take 1 tablet (10 mg total) by mouth daily with breakfast. 12/02/14  Yes Tresa Garter, MD  ibuprofen (ADVIL,MOTRIN) 600 MG tablet Take 1 tablet (600 mg total) by mouth 3 (three) times daily with meals  as needed for moderate pain. 06/30/15  Yes Julianne Rice, MD  lisinopril-hydrochlorothiazide (PRINZIDE,ZESTORETIC) 20-12.5 MG tablet Take 1 tablet by mouth daily. 12/02/14  Yes Tresa Garter, MD  metFORMIN (GLUCOPHAGE) 1000 MG tablet Take 1 tablet (1,000 mg total) by mouth 2 (two) times daily with a meal. 12/02/14  Yes Tresa Garter, MD  simvastatin (ZOCOR) 40 MG tablet Take 1 tablet (40 mg total) by mouth daily at 6 PM. 12/02/14  Yes Olugbemiga E Doreene Burke, MD  terbinafine (LAMISIL) 250 MG tablet Take 1 tablet (250 mg total) by mouth daily. 12/30/14  Yes Wallene Huh, DPM  methocarbamol (ROBAXIN) 500 MG tablet Take 2 tablets (1,000 mg total) by mouth every 6 (six) hours as needed for muscle spasms. Patient not taking: Reported on 10/20/2015 06/30/15   Julianne Rice, MD     Objective:  EXAM:   Vitals:   10/20/15 1108  BP: 131/86  Pulse: 79  Temp: 98.2 F (36.8 C)  TempSrc: Oral  Weight: 293 lb 12.8 oz (133.3 kg)    General appearance : A&OX3. NAD. Non-toxic-appearing HEENT: Atraumatic and Normocephalic.  PERRLA. EOM intact.  TM clear B. Mouth-MMM/poor dentition, post pharynx WNL w/o erythema, No PND.  No rash or visible swelling below lower lip currently.  No erythema. Neck: supple, no JVD. No cervical lymphadenopathy. No thyromegaly Chest/Lungs:  Breathing-non-labored, Good air entry bilaterally, breath sounds normal without rales, rhonchi, or wheezing  CVS: S1 S2 regular, no  murmurs, gallops, rubs . Extremities: Bilateral Lower Ext shows no edema, both legs are warm to touch with = pulse throughout L axilla-superior aspect 1X2cm freely mobile, non-erythematous, non-fluctuant or indurated, well circumscribed area with visible puncta. No LON either axilla Neurology:  CN II-XII grossly intact, Non focal.   Psych:  TP linear. J/I WNL. Normal speech. Appropriate eye contact and affect.  Skin:  No Rash  Data Review Lab Results  Component Value Date   HGBA1C 9.4  10/20/2015   HGBA1C 9.0 04/14/2015   HGBA1C 10.90 12/02/2014     Assessment & Plan   1. Sebaceous cyst of left axilla Watchful waiting.  Consider referral if needs removal/becomes problematic in the future.    2. Diabetes mellitus due to underlying condition with hyperosmolarity without coma, without long-term current use of insulin (HCC) - POCT glucose (manual entry) - POCT glycosylated hemoglobin (Hb A1C) Continue current medications.  Rigorously watch diet and exercise at least 10-15 mins daily.  Avoid sugars.  Drink water.  Check sugars at least every other day(this was the most he was willing to agree to)  3. Essential hypertension, benign At goal Continue current regimen  4. Lip swelling Zyrtec daily X 2 weeks   Patient have been counseled extensively about nutrition and exercise  Return in about 6 weeks (around 12/01/2015) for see Dr. Doreene Burke for check up.  The patient was given clear instructions to go to ER or return to medical center if symptoms don't improve, worsen or new problems develop. The patient verbalized understanding. The patient was told to call to get lab results if they haven't heard anything in the next week.     Freeman Caldron, PA-C [facility] [place] [place], [place]   10/20/2015, 12:04 PM

## 2015-12-10 ENCOUNTER — Other Ambulatory Visit: Payer: Self-pay | Admitting: Internal Medicine

## 2015-12-10 DIAGNOSIS — E119 Type 2 diabetes mellitus without complications: Secondary | ICD-10-CM

## 2015-12-22 ENCOUNTER — Ambulatory Visit: Payer: BC Managed Care – PPO | Attending: Internal Medicine | Admitting: Internal Medicine

## 2015-12-22 ENCOUNTER — Ambulatory Visit (INDEPENDENT_AMBULATORY_CARE_PROVIDER_SITE_OTHER): Payer: BC Managed Care – PPO | Admitting: Podiatry

## 2015-12-22 ENCOUNTER — Encounter: Payer: Self-pay | Admitting: Podiatry

## 2015-12-22 ENCOUNTER — Encounter: Payer: Self-pay | Admitting: Internal Medicine

## 2015-12-22 VITALS — Ht 72.0 in | Wt 288.0 lb

## 2015-12-22 VITALS — BP 109/73 | HR 90 | Temp 98.1°F | Resp 18 | Ht 72.0 in | Wt 288.0 lb

## 2015-12-22 DIAGNOSIS — B351 Tinea unguium: Secondary | ICD-10-CM | POA: Diagnosis not present

## 2015-12-22 DIAGNOSIS — E0800 Diabetes mellitus due to underlying condition with hyperosmolarity without nonketotic hyperglycemic-hyperosmolar coma (NKHHC): Secondary | ICD-10-CM

## 2015-12-22 DIAGNOSIS — M79606 Pain in leg, unspecified: Secondary | ICD-10-CM

## 2015-12-22 DIAGNOSIS — E785 Hyperlipidemia, unspecified: Secondary | ICD-10-CM | POA: Insufficient documentation

## 2015-12-22 DIAGNOSIS — I10 Essential (primary) hypertension: Secondary | ICD-10-CM | POA: Diagnosis present

## 2015-12-22 DIAGNOSIS — Z79899 Other long term (current) drug therapy: Secondary | ICD-10-CM | POA: Insufficient documentation

## 2015-12-22 DIAGNOSIS — M79676 Pain in unspecified toe(s): Secondary | ICD-10-CM

## 2015-12-22 DIAGNOSIS — E119 Type 2 diabetes mellitus without complications: Secondary | ICD-10-CM | POA: Diagnosis not present

## 2015-12-22 DIAGNOSIS — Z7984 Long term (current) use of oral hypoglycemic drugs: Secondary | ICD-10-CM | POA: Insufficient documentation

## 2015-12-22 LAB — CBC WITH DIFFERENTIAL/PLATELET
BASOS ABS: 66 {cells}/uL (ref 0–200)
Basophils Relative: 1 %
EOS PCT: 2 %
Eosinophils Absolute: 132 cells/uL (ref 15–500)
HCT: 46.6 % (ref 38.5–50.0)
Hemoglobin: 16 g/dL (ref 13.2–17.1)
Lymphocytes Relative: 34 %
Lymphs Abs: 2244 cells/uL (ref 850–3900)
MCH: 27.2 pg (ref 27.0–33.0)
MCHC: 34.3 g/dL (ref 32.0–36.0)
MCV: 79.3 fL — ABNORMAL LOW (ref 80.0–100.0)
MONOS PCT: 7 %
MPV: 9.5 fL (ref 7.5–12.5)
Monocytes Absolute: 462 cells/uL (ref 200–950)
NEUTROS ABS: 3696 {cells}/uL (ref 1500–7800)
Neutrophils Relative %: 56 %
PLATELETS: 264 10*3/uL (ref 140–400)
RBC: 5.88 MIL/uL — AB (ref 4.20–5.80)
RDW: 15.3 % — ABNORMAL HIGH (ref 11.0–15.0)
WBC: 6.6 10*3/uL (ref 3.8–10.8)

## 2015-12-22 LAB — GLUCOSE, POCT (MANUAL RESULT ENTRY): POC Glucose: 138 mg/dl — AB (ref 70–99)

## 2015-12-22 LAB — TSH: TSH: 0.55 m[IU]/L (ref 0.40–4.50)

## 2015-12-22 MED ORDER — CANAGLIFLOZIN 100 MG PO TABS: 100.0000 mg | ORAL_TABLET | Freq: Every day | ORAL | 3 refills | Status: DC

## 2015-12-22 MED ORDER — AMLODIPINE BESYLATE 10 MG PO TABS: 10.0000 mg | ORAL_TABLET | Freq: Every day | ORAL | 3 refills | Status: DC

## 2015-12-22 MED ORDER — GABAPENTIN 100 MG PO CAPS: 100.0000 mg | ORAL_CAPSULE | Freq: Three times a day (TID) | ORAL | 3 refills | Status: DC

## 2015-12-22 MED ORDER — LISINOPRIL-HYDROCHLOROTHIAZIDE 20-12.5 MG PO TABS: 1.0000 | ORAL_TABLET | Freq: Every day | ORAL | 3 refills | Status: DC

## 2015-12-22 MED ORDER — GLIPIZIDE ER 10 MG PO TB24: 10.0000 mg | ORAL_TABLET | Freq: Every day | ORAL | 3 refills | Status: DC

## 2015-12-22 MED ORDER — METFORMIN HCL 1000 MG PO TABS: 1000.0000 mg | ORAL_TABLET | Freq: Two times a day (BID) | ORAL | 3 refills | Status: DC

## 2015-12-22 MED ORDER — SIMVASTATIN 40 MG PO TABS: 40.0000 mg | ORAL_TABLET | Freq: Every day | ORAL | 3 refills | Status: DC

## 2015-12-22 NOTE — Progress Notes (Signed)
Anthony Vincent, is a 45 y.o. male  [id number]  [id number]  DOB - 1970/06/13  Chief Complaint  Patient presents with  . Follow-up      Subjective:   Anthony Vincent is a 45 y.o. male with history of type 2 diabetes mellitus without complications, hyperlipidemia and hypertension here today for a follow up visit and medication refills. Patient continues to adhere with lifestyle modification as part of his diabetes and HTN management. BP is controlled, last HbA1C still high at 9.4%. He recently noticed a slightly swollen inside of his lip on the left side, no redness, no fever, no itching, episodic, not present today. No lumpy throat, no difficulty breathing or SOB. No new medication or food content. Patient does not remember any inciting event. Patient has No headache, No chest pain, No abdominal pain - No Nausea, No new weakness tingling or numbness, No Cough - SOB.  No problems updated.  ALLERGIES: No Known Allergies  PAST MEDICAL HISTORY: Past Medical History:  Diagnosis Date  . Diabetes mellitus without complication ([place])   . Hyperlipidemia   . Hypertension     MEDICATIONS AT HOME: Prior to Admission medications   Medication Sig Start Date End Date Taking? Authorizing Provider  amLODipine (NORVASC) 10 MG tablet Take 1 tablet (10 mg total) by mouth daily. 12/22/15  Yes Tresa Garter, MD  canagliflozin (INVOKANA) 100 MG TABS tablet Take 1 tablet (100 mg total) by mouth daily. 12/22/15  Yes Tresa Garter, MD  gabapentin (NEURONTIN) 100 MG capsule Take 1 capsule (100 mg total) by mouth 3 (three) times daily. 12/22/15  Yes Tresa Garter, MD  glipiZIDE (GLIPIZIDE XL) 10 MG 24 hr tablet Take 1 tablet (10 mg total) by mouth daily with breakfast. 12/22/15  Yes Tresa Garter, MD  ibuprofen (ADVIL,MOTRIN) 600 MG tablet Take 1 tablet (600 mg total) by mouth 3 (three) times daily with meals as needed for moderate pain. 06/30/15  Yes Julianne Rice, MD    lisinopril-hydrochlorothiazide (PRINZIDE,ZESTORETIC) 20-12.5 MG tablet Take 1 tablet by mouth daily. 12/22/15  Yes Tresa Garter, MD  metFORMIN (GLUCOPHAGE) 1000 MG tablet Take 1 tablet (1,000 mg total) by mouth 2 (two) times daily with a meal. 12/22/15  Yes Tresa Garter, MD  simvastatin (ZOCOR) 40 MG tablet Take 1 tablet (40 mg total) by mouth daily at 6 PM. 12/22/15  Yes Tresa Garter, MD    Objective:   Vitals:   12/22/15 1151  BP: 109/73  Pulse: 90  Resp: 18  Temp: 98.1 F (36.7 C)  TempSrc: Oral  SpO2: 96%  Weight: 288 lb (130.6 kg)  Height: 6' (1.829 m)   Exam General appearance : Awake, alert, not in any distress. Speech Clear. Not toxic looking, obese HEENT: Atraumatic and Normocephalic, pupils equally reactive to light and accomodation. No swelling appreciated on oral examination. No redness, some dental caries, otherwise unremarkable. Neck: Supple, no JVD. No cervical lymphadenopathy.  Chest: Good air entry bilaterally, no added sounds  CVS: S1 S2 regular, no murmurs.  Abdomen: Bowel sounds present, Non tender and not distended with no gaurding, rigidity or rebound. Extremities: B/L Lower Ext shows no edema, both legs are warm to touch Neurology: Awake alert, and oriented X 3, CN II-XII intact, Non focal Skin: No Rash  Data Review Lab Results  Component Value Date   HGBA1C 9.4 10/20/2015   HGBA1C 9.0 04/14/2015   HGBA1C 10.90 12/02/2014    Assessment & Plan   1. Essential hypertension,  benign  - lisinopril-hydrochlorothiazide (PRINZIDE,ZESTORETIC) 20-12.5 MG tablet; Take 1 tablet by mouth daily.  Dispense: 90 tablet; Refill: 3 - amLODipine (NORVASC) 10 MG tablet; Take 1 tablet (10 mg total) by mouth daily.  Dispense: 90 tablet; Refill: 3  - CBC with Differential/Platelet - Sedimentation Rate  We have discussed target BP range and blood pressure goal. I have advised patient to check BP regularly and to call us back or report to clinic if the  numbers are consistently higher than 140/90. We discussed the importance of compliance with medical therapy and DASH diet recommended, consequences of uncontrolled hypertension discussed.  - continue current BP medications  2. Dyslipidemia  - simvastatin (ZOCOR) 40 MG tablet; Take 1 tablet (40 mg total) by mouth daily at 6 PM.  Dispense: 90 tablet; Refill: 3  - Lipid panel - TSH  To address this please limit saturated fat to no more than 7% of your calories, limit cholesterol to 200 mg/day, increase fiber and exercise as tolerated. If needed we may add another cholesterol lowering medication to your regimen.   3. Type 2 diabetes mellitus without complication, unspecified long term insulin use status (HCC)  - Glucose (CBG) - Microalbumin/Creatinine Ratio, Urine - metFORMIN (GLUCOPHAGE) 1000 MG tablet; Take 1 tablet (1,000 mg total) by mouth 2 (two) times daily with a meal.  Dispense: 180 tablet; Refill: 3 - glipiZIDE (GLIPIZIDE XL) 10 MG 24 hr tablet; Take 1 tablet (10 mg total) by mouth daily with breakfast.  Dispense: 90 tablet; Refill: 3 - gabapentin (NEURONTIN) 100 MG capsule; Take 1 capsule (100 mg total) by mouth 3 (three) times daily.  Dispense: 270 capsule; Refill: 3 - canagliflozin (INVOKANA) 100 MG TABS tablet; Take 1 tablet (100 mg total) by mouth daily.  Dispense: 90 tablet; Refill: 3  - COMPLETE METABOLIC PANEL WITH GFR - Urinalysis, Complete   Patient have been counseled extensively about nutrition and exercise. Other issues discussed during this visit include: low cholesterol diet, weight control and daily exercise, foot care, annual eye examinations at Ophthalmology, importance of adherence with medications and regular follow-up. We also discussed long term complications of uncontrolled diabetes and hypertension.   Return in about 3 months (around 03/23/2016) for Follow up HTN, Hemoglobin A1C and Follow up, DM, Routine Follow Up.  The patient was given clear instructions to  go to ER or return to medical center if symptoms don't improve, worsen or new problems develop. The patient verbalized understanding. The patient was told to call to get lab results if they haven't heard anything in the next week.   This note has been created with Surveyor, quantity. Any transcriptional errors are unintentional.    Angelica Chessman, MD, [place], [place], [place], [place] [facility] [place], [place]   12/22/2015, 12:04 PM

## 2015-12-22 NOTE — Progress Notes (Signed)
Patient is here for check up DM  Patient has not taken medication today. Patient has not eaten today.  Patient would like to FU on bottom lip edema.

## 2015-12-22 NOTE — Progress Notes (Signed)
Complaint:  Visit Type: Patient returns to my office for continued preventative foot care services. Complaint: Patient states" my nails have grown long and thick and become painful to walk and wear shoes" Patient has been diagnosed with DM with no foot complications. The patient presents for preventative foot care services. No changes to ROS.  Patient has previously taken lamisil for his fungal great toenails  Both feet. Podiatric Exam: Vascular: dorsalis pedis and posterior tibial pulses are palpable bilateral. Capillary return is immediate. Temperature gradient is WNL. Skin turgor WNL  Sensorium: Normal Semmes Weinstein monofilament test. Normal tactile sensation bilaterally. Nail Exam: Pt has thick disfigured discolored nails with subungual debris noted bilateral entire nail hallux through fifth toenails Ulcer Exam: There is no evidence of ulcer or pre-ulcerative changes or infection. Orthopedic Exam: Muscle tone and strength are WNL. No limitations in general ROM. No crepitus or effusions noted. Foot type and digits show no abnormalities. Bony prominences are unremarkable. Skin: No Porokeratosis. No infection or ulcers  Diagnosis:  Onychomycosis, , Pain in right toe, pain in left toes  Treatment & Plan Procedures and Treatment: Consent by patient was obtained for treatment procedures. The patient understood the discussion of treatment and procedures well. All questions were answered thoroughly reviewed. Debridement of mycotic and hypertrophic toenails, 1 through 5 bilateral and clearing of subungual debris. No ulceration, no infection noted.  Return Visit-Office Procedure: Patient instructed to return to the office for a follow up visit 3 months for continued evaluation and treatment.    Gardiner Barefoot DPM

## 2015-12-22 NOTE — Patient Instructions (Addendum)
Diabetes and Foot Care Diabetes may cause you to have problems because of poor blood supply (circulation) to your feet and legs. This may cause the skin on your feet to become thinner, break easier, and heal more slowly. Your skin may become dry, and the skin may peel and crack. You may also have nerve damage in your legs and feet causing decreased feeling in them. You may not notice minor injuries to your feet that could lead to infections or more serious problems. Taking care of your feet is one of the most important things you can do for yourself.  HOME CARE INSTRUCTIONS  Wear shoes at all times, even in the house. Do not go barefoot. Bare feet are easily injured.  Check your feet daily for blisters, cuts, and redness. If you cannot see the bottom of your feet, use a mirror or ask someone for help.  Wash your feet with warm water (do not use hot water) and mild soap. Then pat your feet and the areas between your toes until they are completely dry. Do not soak your feet as this can dry your skin.  Apply a moisturizing lotion or petroleum jelly (that does not contain alcohol and is unscented) to the skin on your feet and to dry, brittle toenails. Do not apply lotion between your toes.  Trim your toenails straight across. Do not dig under them or around the cuticle. File the edges of your nails with an emery board or nail file.  Do not cut corns or calluses or try to remove them with medicine.  Wear clean socks or stockings every day. Make sure they are not too tight. Do not wear knee-high stockings since they may decrease blood flow to your legs.  Wear shoes that fit properly and have enough cushioning. To break in new shoes, wear them for just a few hours a day. This prevents you from injuring your feet. Always look in your shoes before you put them on to be sure there are no objects inside.  Do not cross your legs. This may decrease the blood flow to your feet.  If you find a minor scrape,  cut, or break in the skin on your feet, keep it and the skin around it clean and dry. These areas may be cleansed with mild soap and water. Do not cleanse the area with peroxide, alcohol, or iodine.  When you remove an adhesive bandage, be sure not to damage the skin around it.  If you have a wound, look at it several times a day to make sure it is healing.  Do not use heating pads or hot water bottles. They may burn your skin. If you have lost feeling in your feet or legs, you may not know it is happening until it is too late.  Make sure your health care provider performs a complete foot exam at least annually or more often if you have foot problems. Report any cuts, sores, or bruises to your health care provider immediately. SEEK MEDICAL CARE IF:   You have an injury that is not healing.  You have cuts or breaks in the skin.  You have an ingrown nail.  You notice redness on your legs or feet.  You feel burning or tingling in your legs or feet.  You have pain or cramps in your legs and feet.  Your legs or feet are numb.  Your feet always feel cold. SEEK IMMEDIATE MEDICAL CARE IF:   There is increasing redness,   swelling, or pain in or around a wound.  There is a red line that goes up your leg.  Pus is coming from a wound.  You develop a fever or as directed by your health care provider.  You notice a bad smell coming from an ulcer or wound.   This information is not intended to replace advice given to you by your health care provider. Make sure you discuss any questions you have with your health care provider.   Document Released: 02/03/2000 Document Revised: 10/08/2012 Document Reviewed: 07/15/2012 Elsevier Interactive Patient Education 2016 [employer]. Diabetes and Exercise Exercising regularly is important. It is not just about losing weight. It has many health benefits, such as:  Improving your overall fitness, flexibility, and endurance.  Increasing your bone  density.  Helping with weight control.  Decreasing your body fat.  Increasing your muscle strength.  Reducing stress and tension.  Improving your overall health. People with diabetes who exercise gain additional benefits because exercise:  Reduces appetite.  Improves the body's use of blood sugar (glucose).  Helps lower or control blood glucose.  Decreases blood pressure.  Helps control blood lipids (such as cholesterol and triglycerides).  Improves the body's use of the hormone insulin by:  Increasing the body's insulin sensitivity.  Reducing the body's insulin needs.  Decreases the risk for heart disease because exercising:  Lowers cholesterol and triglycerides levels.  Increases the levels of good cholesterol (such as high-density lipoproteins [HDL]) in the body.  Lowers blood glucose levels. YOUR ACTIVITY PLAN  Choose an activity that you enjoy, and set realistic goals. To exercise safely, you should begin practicing any new physical activity slowly, and gradually increase the intensity of the exercise over time. Your health care provider or diabetes educator can help create an activity plan that works for you. General recommendations include:  Encouraging children to engage in at least 60 minutes of physical activity each day.  Stretching and performing strength training exercises, such as yoga or weight lifting, at least 2 times per week.  Performing a total of at least 150 minutes of moderate-intensity exercise each week, such as brisk walking or water aerobics.  Exercising at least 3 days per week, making sure you allow no more than 2 consecutive days to pass without exercising.  Avoiding long periods of inactivity (90 minutes or more). When you have to spend an extended period of time sitting down, take frequent breaks to walk or stretch. RECOMMENDATIONS FOR EXERCISING WITH TYPE 1 OR TYPE 2 DIABETES   Check your blood glucose before exercising. If blood  glucose levels are greater than 240 mg/dL, check for urine ketones. Do not exercise if ketones are present.  Avoid injecting insulin into areas of the body that are going to be exercised. For example, avoid injecting insulin into:  The arms when playing tennis.  The legs when jogging.  Keep a record of:  Food intake before and after you exercise.  Expected peak times of insulin action.  Blood glucose levels before and after you exercise.  The type and amount of exercise you have done.  Review your records with your health care provider. Your health care provider will help you to develop guidelines for adjusting food intake and insulin amounts before and after exercising.  If you take insulin or oral hypoglycemic agents, watch for signs and symptoms of hypoglycemia. They include:  Dizziness.  Shaking.  Sweating.  Chills.  Confusion.  Drink plenty of water while you exercise to prevent  dehydration or heat stroke. Body water is lost during exercise and must be replaced.  Talk to your health care provider before starting an exercise program to make sure it is safe for you. Remember, almost any type of activity is better than none.   This information is not intended to replace advice given to you by your health care provider. Make sure you discuss any questions you have with your health care provider.   Document Released: 04/28/2003 Document Revised: 06/22/2014 Document Reviewed: 07/15/2012 Elsevier Interactive Patient Education 2016 [place] Carbohydrate Counting for Diabetes Mellitus Carbohydrate counting is a method for keeping track of the amount of carbohydrates you eat. Eating carbohydrates naturally increases the level of sugar (glucose) in your blood, so it is important for you to know the amount that is okay for you to have in every meal. Carbohydrate counting helps keep the level of glucose in your blood within normal limits. The amount of carbohydrates  allowed is different for every person. A dietitian can help you calculate the amount that is right for you. Once you know the amount of carbohydrates you can have, you can count the carbohydrates in the foods you want to eat. Carbohydrates are found in the following foods:  Grains, such as breads and cereals.  Dried beans and soy products.  Starchy vegetables, such as potatoes, peas, and corn.  Fruit and fruit juices.  Milk and yogurt.  Sweets and snack foods, such as cake, cookies, candy, chips, soft drinks, and fruit drinks. CARBOHYDRATE COUNTING There are two ways to count the carbohydrates in your food. You can use either of the methods or a combination of both. Reading the "Nutrition Facts" on [place] The "Nutrition Facts" is an area that is included on the labels of almost all packaged food and beverages in the [place]. It includes the serving size of that food or beverage and information about the nutrients in each serving of the food, including the grams (g) of carbohydrate per serving.  Decide the number of servings of this food or beverage that you will be able to eat or drink. Multiply that number of servings by the number of grams of carbohydrate that is listed on the label for that serving. The total will be the amount of carbohydrates you will be having when you eat or drink this food or beverage. Learning Standard Serving Sizes of Food When you eat food that is not packaged or does not include "Nutrition Facts" on the label, you need to measure the servings in order to count the amount of carbohydrates.A serving of most carbohydrate-rich foods contains about 15 g of carbohydrates. The following list includes serving sizes of carbohydrate-rich foods that provide 15 g ofcarbohydrate per serving:   1 slice of bread (1 oz) or 1 six-inch tortilla.    of a hamburger bun or English muffin.  4-6 crackers.   cup unsweetened dry cereal.    cup hot cereal.    cup rice or pasta.    cup mashed potatoes or  of a large baked potato.  1 cup fresh fruit or one small piece of fruit.    cup canned or frozen fruit or fruit juice.  1 cup milk.   cup plain fat-free yogurt or yogurt sweetened with artificial sweeteners.   cup cooked dried beans or starchy vegetable, such as peas, corn, or potatoes.  Decide the number of standard-size servings that you will eat. Multiply that number of servings by 15 (the grams  of carbohydrates in that serving). For example, if you eat 2 cups of strawberries, you will have eaten 2 servings and 30 g of carbohydrates (2 servings x 15 g = 30 g). For foods such as soups and casseroles, in which more than one food is mixed in, you will need to count the carbohydrates in each food that is included. EXAMPLE OF CARBOHYDRATE COUNTING Sample Dinner  3 oz chicken breast.   cup of brown rice.   cup of corn.  1 cup milk.   1 cup strawberries with sugar-free whipped topping.  Carbohydrate Calculation Step 1: Identify the foods that contain carbohydrates:   Rice.   Corn.   Milk.   Strawberries. Step 2:Calculate the number of servings eaten of each:   2 servings of rice.   1 serving of corn.   1 serving of milk.   1 serving of strawberries. Step 3: Multiply each of those number of servings by 15 g:   2 servings of rice x 15 g = 30 g.   1 serving of corn x 15 g = 15 g.   1 serving of milk x 15 g = 15 g.   1 serving of strawberries x 15 g = 15 g. Step 4: Add together all of the amounts to find the total grams of carbohydrates eaten: 30 g + 15 g + 15 g + 15 g = 75 g.   This information is not intended to replace advice given to you by your health care provider. Make sure you discuss any questions you have with your health care provider.   Document Released: 02/05/2005 Document Revised: 02/26/2014 Document Reviewed: 01/02/2013 Elsevier Interactive Patient Education 2016 [employer]. Hypertension Hypertension, commonly called high blood pressure, is when the force of blood pumping through your arteries is too strong. Your arteries are the blood vessels that carry blood from your heart throughout your body. A blood pressure reading consists of a higher number over a lower number, such as 110/72. The higher number (systolic) is the pressure inside your arteries when your heart pumps. The lower number (diastolic) is the pressure inside your arteries when your heart relaxes. Ideally you want your blood pressure below 120/80. Hypertension forces your heart to work harder to pump blood. Your arteries may become narrow or stiff. Having untreated or uncontrolled hypertension can cause heart attack, stroke, kidney disease, and other problems. RISK FACTORS Some risk factors for high blood pressure are controllable. Others are not.  Risk factors you cannot control include:   Race. You may be at higher risk if you are African American.  Age. Risk increases with age.  Gender. Men are at higher risk than women before age 27 years. After age 34, women are at higher risk than men. Risk factors you can control include:  Not getting enough exercise or physical activity.  Being overweight.  Getting too much fat, sugar, calories, or salt in your diet.  Drinking too much alcohol. SIGNS AND SYMPTOMS Hypertension does not usually cause signs or symptoms. Extremely high blood pressure (hypertensive crisis) may cause headache, anxiety, shortness of breath, and nosebleed. DIAGNOSIS To check if you have hypertension, your health care provider will measure your blood pressure while you are seated, with your arm held at the level of your heart. It should be measured at least twice using the same arm. Certain conditions can cause a difference in blood pressure between your right and left arms. A blood pressure reading that is higher than  normal on one occasion does not mean that you need  treatment. If it is not clear whether you have high blood pressure, you may be asked to return on a different day to have your blood pressure checked again. Or, you may be asked to monitor your blood pressure at home for 1 or more weeks. TREATMENT Treating high blood pressure includes making lifestyle changes and possibly taking medicine. Living a healthy lifestyle can help lower high blood pressure. You may need to change some of your habits. Lifestyle changes may include:  Following the DASH diet. This diet is high in fruits, vegetables, and whole grains. It is low in salt, red meat, and added sugars.  Keep your sodium intake below 2,300 mg per day.  Getting at least 30-45 minutes of aerobic exercise at least 4 times per week.  Losing weight if necessary.  Not smoking.  Limiting alcoholic beverages.  Learning ways to reduce stress. Your health care provider may prescribe medicine if lifestyle changes are not enough to get your blood pressure under control, and if one of the following is true:  You are 34-90 years of age and your systolic blood pressure is above 140.  You are 34 years of age or older, and your systolic blood pressure is above 150.  Your diastolic blood pressure is above 90.  You have diabetes, and your systolic blood pressure is over [id number] or your diastolic blood pressure is over 90.  You have kidney disease and your blood pressure is above 140/90.  You have heart disease and your blood pressure is above 140/90. Your personal target blood pressure may vary depending on your medical conditions, your age, and other factors. HOME CARE INSTRUCTIONS  Have your blood pressure rechecked as directed by your health care provider.   Take medicines only as directed by your health care provider. Follow the directions carefully. Blood pressure medicines must be taken as prescribed. The medicine does not work as well when you skip doses. Skipping doses also puts you at risk for  problems.  Do not smoke.   Monitor your blood pressure at home as directed by your health care provider. SEEK MEDICAL CARE IF:   You think you are having a reaction to medicines taken.  You have recurrent headaches or feel dizzy.  You have swelling in your ankles.  You have trouble with your vision. SEEK IMMEDIATE MEDICAL CARE IF:  You develop a severe headache or confusion.  You have unusual weakness, numbness, or feel faint.  You have severe chest or abdominal pain.  You vomit repeatedly.  You have trouble breathing. MAKE SURE YOU:   Understand these instructions.  Will watch your condition.  Will get help right away if you are not doing well or get worse.   This information is not intended to replace advice given to you by your health care provider. Make sure you discuss any questions you have with your health care provider.   Document Released: 02/05/2005 Document Revised: 06/22/2014 Document Reviewed: 11/28/2012 Elsevier Interactive Patient Education [employer].

## 2015-12-23 LAB — URINALYSIS, COMPLETE
BACTERIA UA: NONE SEEN [HPF]
Bilirubin Urine: NEGATIVE
CRYSTALS: NONE SEEN [HPF]
Casts: NONE SEEN [LPF]
Hgb urine dipstick: NEGATIVE
Ketones, ur: NEGATIVE
Leukocytes, UA: NEGATIVE
Nitrite: NEGATIVE
PROTEIN: NEGATIVE
RBC / HPF: NONE SEEN RBC/HPF (ref <=2)
Specific Gravity, Urine: 1.039 — ABNORMAL HIGH (ref 1.001–1.035)
Squamous Epithelial / LPF: NONE SEEN [HPF] (ref <=5)
WBC, UA: NONE SEEN WBC/HPF (ref <=5)
YEAST: NONE SEEN [HPF]
pH: 5.5 (ref 5.0–8.0)

## 2015-12-23 LAB — MICROALBUMIN / CREATININE URINE RATIO
Creatinine, Urine: 215 mg/dL (ref 20–370)
Microalb Creat Ratio: 13 mcg/mg creat (ref <30)
Microalb, Ur: 2.9 mg/dL

## 2015-12-23 LAB — COMPLETE METABOLIC PANEL WITH GFR
ALT: 29 U/L (ref 9–46)
AST: 17 U/L (ref 10–40)
Albumin: 4.3 g/dL (ref 3.6–5.1)
Alkaline Phosphatase: 58 U/L (ref 40–115)
BUN: 11 mg/dL (ref 7–25)
CO2: 26 mmol/L (ref 20–31)
Calcium: 9.6 mg/dL (ref 8.6–10.3)
Chloride: 103 mmol/L (ref 98–110)
Creat: 1.12 mg/dL (ref 0.60–1.35)
GFR, EST NON AFRICAN AMERICAN: 78 mL/min (ref >=60)
GFR, Est African American: >89 mL/min (ref >=60)
GLUCOSE: 134 mg/dL — AB (ref 65–99)
POTASSIUM: 4.6 mmol/L (ref 3.5–5.3)
SODIUM: 140 mmol/L (ref 135–146)
TOTAL PROTEIN: 7.5 g/dL (ref 6.1–8.1)
Total Bilirubin: 1 mg/dL (ref 0.2–1.2)

## 2015-12-23 LAB — LIPID PANEL
CHOL/HDL RATIO: 4.3 ratio (ref <=5.0)
Cholesterol: 108 mg/dL — ABNORMAL LOW (ref 125–200)
HDL: 25 mg/dL — AB (ref >=40)
LDL CALC: 72 mg/dL (ref <130)
Triglycerides: 57 mg/dL (ref <150)
VLDL: 11 mg/dL (ref <30)

## 2015-12-23 LAB — SEDIMENTATION RATE: SED RATE: 1 mm/h (ref 0–15)

## 2015-12-27 ENCOUNTER — Ambulatory Visit (HOSPITAL_COMMUNITY)
Admission: EM | Admit: 2015-12-27 | Discharge: 2015-12-27 | Disposition: A | Discharge status: Home / Self Care | Payer: BC Managed Care – PPO | Attending: Family Medicine | Admitting: Family Medicine

## 2015-12-27 ENCOUNTER — Encounter (HOSPITAL_COMMUNITY): Payer: Self-pay | Admitting: Emergency Medicine

## 2015-12-27 DIAGNOSIS — L089 Local infection of the skin and subcutaneous tissue, unspecified: Secondary | ICD-10-CM | POA: Diagnosis not present

## 2015-12-27 DIAGNOSIS — L729 Follicular cyst of the skin and subcutaneous tissue, unspecified: Secondary | ICD-10-CM | POA: Diagnosis not present

## 2015-12-27 MED ORDER — CEPHALEXIN 500 MG PO CAPS: 500.0000 mg | ORAL_CAPSULE | Freq: Four times a day (QID) | ORAL | 0 refills | Status: DC

## 2015-12-27 NOTE — ED Triage Notes (Signed)
The patient presented to the [facility] with a complaint of a wound on his face. The patient stated that a wound developed on his face 5 days ago under his left eye. He further stated that today his face has had significant swelling and was sore. The patient also reported some swelling in his lymph nodes on the left side of his neck as well.

## 2015-12-27 NOTE — ED Provider Notes (Signed)
CSN: [id number]     Arrival date & time 12/27/15  1003 History   First MD Initiated Contact with Patient 12/27/15 1022     Chief Complaint  Patient presents with  . Wound Check   (Consider location/radiation/quality/duration/timing/severity/associated sxs/prior Treatment) 45 year old male presents to the urgent care with concern of a raised tender painful area to the left cheek stating that he has had this infection progressing over the past 4 days. He states he is unaware of any known injury or causative event. He believes he may have had a fever 2-3 days ago but not since.      Past Medical History:  Diagnosis Date  . Diabetes mellitus without complication ([place])   . Hyperlipidemia   . Hypertension    History reviewed. No pertinent surgical history. Family History  Problem Relation Age of Onset  . Diabetes Mother   . Diabetes Father    Social History  Substance Use Topics  . Smoking status: Never Smoker  . Smokeless tobacco: Never Used  . Alcohol use No    Review of Systems  Constitutional: Negative for activity change, chills and fatigue.  HENT: Positive for facial swelling. Negative for dental problem and postnasal drip.   Eyes: Negative.   Respiratory: Negative.   Cardiovascular: Negative.   Gastrointestinal: Negative.   Skin: Negative for rash.       Aspirin history of present illness  Neurological: Negative.   All other systems reviewed and are negative.   Allergies  Patient has no known allergies.  Home Medications   Prior to Admission medications   Medication Sig Start Date End Date Taking? Authorizing Provider  amLODipine (NORVASC) 10 MG tablet Take 1 tablet (10 mg total) by mouth daily. 12/22/15  Yes Tresa Garter, MD  canagliflozin (INVOKANA) 100 MG TABS tablet Take 1 tablet (100 mg total) by mouth daily. 12/22/15  Yes Tresa Garter, MD  gabapentin (NEURONTIN) 100 MG capsule Take 1 capsule (100 mg total) by mouth 3 (three) times daily.  12/22/15  Yes Tresa Garter, MD  glipiZIDE (GLIPIZIDE XL) 10 MG 24 hr tablet Take 1 tablet (10 mg total) by mouth daily with breakfast. 12/22/15  Yes Tresa Garter, MD  lisinopril-hydrochlorothiazide (PRINZIDE,ZESTORETIC) 20-12.5 MG tablet Take 1 tablet by mouth daily. 12/22/15  Yes Tresa Garter, MD  metFORMIN (GLUCOPHAGE) 1000 MG tablet Take 1 tablet (1,000 mg total) by mouth 2 (two) times daily with a meal. 12/22/15  Yes Tresa Garter, MD  simvastatin (ZOCOR) 40 MG tablet Take 1 tablet (40 mg total) by mouth daily at 6 PM. 12/22/15  Yes Olugbemiga E Doreene Burke, MD  cephALEXin (KEFLEX) 500 MG capsule Take 1 capsule (500 mg total) by mouth 4 (four) times daily. 12/27/15   Janne Napoleon, NP  ibuprofen (ADVIL,MOTRIN) 600 MG tablet Take 1 tablet (600 mg total) by mouth 3 (three) times daily with meals as needed for moderate pain. 06/30/15   Julianne Rice, MD   Meds Ordered and Administered this Visit  Medications - No data to display  BP 131/85 (BP Location: Left Arm)   Pulse 92   Temp 98.9 F (37.2 C) (Oral)   Resp 18   SpO2 99%  No data found.   Physical Exam  Constitutional: He is oriented to person, place, and time. He appears well-developed and well-nourished.  HENT:  Head: Normocephalic and atraumatic.  Right Ear: External ear normal.  Left Ear: External ear normal.  Eyes: EOM are normal.  Neck: Normal range  of motion. Neck supple.  Cardiovascular: Normal rate.   Pulmonary/Chest: Effort normal.  Musculoskeletal: Normal range of motion.  Neurological: He is alert and oriented to person, place, and time.  Skin: Skin is warm and dry.  Nursing note and vitals reviewed.   Urgent Care Course   Clinical Course     .Marland KitchenIncision and Drainage Date/Time: 12/27/2015 10:54 AM Performed by: Marcha Dutton, Rolla Kedzierski Authorized by: Ihor Gully D   Consent:    Consent obtained:  Verbal   Consent given by:  Patient   Risks discussed:  Bleeding Location:    Type:  Cyst   Size:  3    Location:  Head   Head location:  Face Pre-procedure details:    Skin preparation:  Antiseptic wash Procedure type:    Complexity:  Simple Procedure details:    Needle aspiration: yes     Incision depth:  Dermal   Drainage:  Bloody   Drainage amount:  Scant   Wound treatment:  Wound left open   Packing materials:  None Post-procedure details:    Patient tolerance of procedure:  Tolerated well, no immediate complications     (including critical care time)  Labs Review Labs Reviewed - No data to display  Imaging Review No results found.   Visual Acuity Review  Right Eye Distance:   Left Eye Distance:   Bilateral Distance:    Right Eye Near:   Left Eye Near:    Bilateral Near:         MDM   1. Infected cyst of skin    Apply warm compresses over the area of infection 2-3 times a day. Take the medication as directed. If there is no improvement in 2 days, worsening, spread of redness or increase in size of the infection return or go to the emergency department. Meds ordered this encounter  Medications  . cephALEXin (KEFLEX) 500 MG capsule    Sig: Take 1 capsule (500 mg total) by mouth 4 (four) times daily.    Dispense:  28 capsule    Refill:  0    Order Specific Question:   Supervising Provider    Answer:   Billy Fischer [id number]       Janne Napoleon, NP 12/27/15 1100

## 2015-12-27 NOTE — Discharge Instructions (Signed)
Apply warm compresses over the area of infection 2-3 times a day. Take the medication as directed. If there is no improvement in 2 days, worsening, spread of redness or increase in size of the infection return or go to the emergency department.

## 2015-12-28 ENCOUNTER — Telehealth: Payer: Self-pay | Admitting: *Deleted

## 2015-12-28 NOTE — Telephone Encounter (Signed)
Patient verified DOB Patient is aware of lab results being mostly remarkable. Patient advised to continue blood sugar control and regular exercise as tolerated. Patient expressed understanding and had no further questions at this time.

## 2015-12-28 NOTE — Telephone Encounter (Signed)
-----   Message from Tresa Garter, MD sent at 12/28/2015 12:06 PM EST ----- Please inform patient that his results are mostly unremarkable. Continue blood sugar control and regular physical exercise as tolerated.

## 2016-04-18 ENCOUNTER — Ambulatory Visit: Payer: BC Managed Care – PPO | Attending: Internal Medicine | Admitting: Internal Medicine

## 2016-04-18 VITALS — BP 133/84 | HR 94 | Temp 98.4°F | Resp 18 | Ht 65.0 in | Wt 295.8 lb

## 2016-04-18 DIAGNOSIS — E1142 Type 2 diabetes mellitus with diabetic polyneuropathy: Secondary | ICD-10-CM | POA: Diagnosis not present

## 2016-04-18 DIAGNOSIS — Z7984 Long term (current) use of oral hypoglycemic drugs: Secondary | ICD-10-CM | POA: Diagnosis not present

## 2016-04-18 DIAGNOSIS — I10 Essential (primary) hypertension: Secondary | ICD-10-CM | POA: Diagnosis not present

## 2016-04-18 DIAGNOSIS — E119 Type 2 diabetes mellitus without complications: Secondary | ICD-10-CM | POA: Diagnosis present

## 2016-04-18 DIAGNOSIS — Z79899 Other long term (current) drug therapy: Secondary | ICD-10-CM | POA: Insufficient documentation

## 2016-04-18 DIAGNOSIS — E785 Hyperlipidemia, unspecified: Secondary | ICD-10-CM | POA: Diagnosis not present

## 2016-04-18 DIAGNOSIS — E0800 Diabetes mellitus due to underlying condition with hyperosmolarity without nonketotic hyperglycemic-hyperosmolar coma (NKHHC): Secondary | ICD-10-CM

## 2016-04-18 DIAGNOSIS — E669 Obesity, unspecified: Secondary | ICD-10-CM | POA: Insufficient documentation

## 2016-04-18 DIAGNOSIS — Z6830 Body mass index (BMI) 30.0-30.9, adult: Secondary | ICD-10-CM | POA: Diagnosis not present

## 2016-04-18 LAB — POCT GLYCOSYLATED HEMOGLOBIN (HGB A1C): HEMOGLOBIN A1C: 9.4

## 2016-04-18 MED ORDER — SAXAGLIPTIN HCL 2.5 MG PO TABS: 2.5000 mg | ORAL_TABLET | Freq: Every day | ORAL | 3 refills | Status: DC

## 2016-04-18 NOTE — Progress Notes (Signed)
Patient is here for DM  Patient would like to replace the invokana.

## 2016-04-18 NOTE — Progress Notes (Signed)
Subjective:  Patient ID: Anthony Vincent, male    DOB: [birth year]  Age: 46 y.o. MRN: [id number] HPI Anthony Vincent presents for f/u for chronic medical conditions including DM, HPTN, and hyperlipidemia.   Diabetes Mellitus Type II, Follow-up: Patient here for follow-up of Type 2 diabetes mellitus.  Current symptoms/problems include paresthesia of the feet and have been stable. He takes gabapentin. Symptoms have been present for a few weeks.  Known diabetic complications: peripheral neuropathy Cardiovascular risk factors: diabetes mellitus, dyslipidemia, hypertension, male gender, obesity (BMI >= 30 kg/m2) and sedentary lifestyle Current diabetic medications include oral agents (triple therapy): metformin, glipizide, and invokana. He has not been taking invokana for past 3 weeks due to cost..   Eye exam current (within one year): no Weight trend: stable Prior visit with dietician: yes - 'many years' Current diet: in general, a "healthy" diet   Current exercise: walking x2 week  Current monitoring regimen: home blood tests - 2 times daily Home blood sugar records: not available Any episodes of hypoglycemia? no  Is He on ACE inhibitor or angiotensin II receptor blocker?  Yes  lisinopril (generic) & on statin.  Hypertension: Patient here for follow-up of elevated blood pressure. He is exercising and is adherent to low salt diet.  Blood pressure is well controlled at home. Cardiac symptoms none. Patient denies none.  Cardiovascular risk factors: diabetes mellitus, dyslipidemia, hypertension, male gender, obesity (BMI >= 30 kg/m2) and sedentary lifestyle. Use of agents associated with hypertension: none. History of target organ damage: none.  Hyperlipidemia: Patient presents with hyperlipidemia.  He was tested because of his history with diabetes, obesity, and sedentary lifestyle.  His last labs showed Total cholesterol of 4.3, HDL 25, LDL 72,  Triglycerides 57. negative. There is a family  history of hyperlipidemia. There is not a family history of early ischemia heart disease.  Past Medical History:  Diagnosis Date  . Diabetes mellitus without complication ([place])   . Hyperlipidemia   . Hypertension    Family History  Problem Relation Age of Onset  . Diabetes Mother   . Diabetes Father    Social History  Substance Use Topics  . Smoking status: Never Smoker  . Smokeless tobacco: Never Used  . Alcohol use No   ROS Review of Systems  Constitutional: Negative.    Objective:   Today's Vitals: BP 133/84 (BP Location: Left Arm, Patient Position: Sitting, Cuff Size: Normal)   Pulse 94   Temp 98.4 F (36.9 C) (Oral)   Resp 18   Ht 5\' 5"  (1.651 m)   Wt 295 lb 12.8 oz (134.2 kg)   SpO2 97%   BMI 49.22 kg/m   Physical Exam  Constitutional: He is oriented to person, place, and time.  obese  HENT:  Head: Normocephalic and atraumatic.  Eyes: Conjunctivae are normal. Pupils are equal, round, and reactive to light.  Neck: Normal range of motion. No JVD present. No tracheal deviation present.  Cardiovascular: Normal rate and regular rhythm.   Pulmonary/Chest: Effort normal and breath sounds normal.  Abdominal: Soft. There is no tenderness.  Musculoskeletal: Normal range of motion.  Neurological: He is alert and oriented to person, place, and time.  Skin: Skin is warm and dry.  Psychiatric: He has a normal mood and affect. His behavior is normal. Thought content normal.    Assessment & Plan:   1. DM- type 2, not controlled, not insulin dependent - We discussed several options for how to afford his medications. We will change him  to generic Onglyza to see if it is better covered.  - We may have to consider lantus if his insurance will not cover other medications. We will refer him to an eye doctor.   Aim for 30 minutes of exercise most days. Rethink what you drink. Water is great! Aim for 2-3 Carb Choices per meal (30-45 grams) +/- 1 either way  Aim for 0-15  Carbs per snack if hungry  Include protein in moderation with your meals and snacks  Consider reading food labels for Total Carbohydrate and Fat Grams of foods  Consider checking BG at alternate times per day  Continue taking medication as directed Be mindful about how much sugar you are adding to beverages and other foods. Fruit Punch - find one with no sugar  Measure and decrease portions of carbohydrate foods  Make your plate and don't go back for seconds  2. HTN: - well controlled on current regimen. We will plan to re-check his bloodwork at his next visit.   We have discussed target BP range and blood pressure goal. I have advised patient to check BP regularly and to call us back or report to clinic if the numbers are consistently higher than 140/90. We discussed the importance of compliance with medical therapy and DASH diet recommended, consequences of uncontrolled hypertension discussed.  - continue current BP medications  3. Hyperlipidemia:  Controlled. Continue statin and we will plan to check another lipid panel in November.   To address this please limit saturated fat to no more than 7% of your calories, limit cholesterol to 200 mg/day, increase fiber and exercise as tolerated. If needed we may add another cholesterol lowering medication to your regimen.   Outpatient Encounter Prescriptions as of 04/18/2016  Medication Sig  . amLODipine (NORVASC) 10 MG tablet Take 1 tablet (10 mg total) by mouth daily.  Marland Kitchen gabapentin (NEURONTIN) 100 MG capsule Take 1 capsule (100 mg total) by mouth 3 (three) times daily.  Marland Kitchen glipiZIDE (GLIPIZIDE XL) 10 MG 24 hr tablet Take 1 tablet (10 mg total) by mouth daily with breakfast.  . ibuprofen (ADVIL,MOTRIN) 600 MG tablet Take 1 tablet (600 mg total) by mouth 3 (three) times daily with meals as needed for moderate pain.  Marland Kitchen lisinopril-hydrochlorothiazide (PRINZIDE,ZESTORETIC) 20-12.5 MG tablet Take 1 tablet by mouth daily.  . metFORMIN (GLUCOPHAGE) 1000  MG tablet Take 1 tablet (1,000 mg total) by mouth 2 (two) times daily with a meal.  . saxagliptin HCl (ONGLYZA) 2.5 MG TABS tablet Take 1 tablet (2.5 mg total) by mouth daily.  . simvastatin (ZOCOR) 40 MG tablet Take 1 tablet (40 mg total) by mouth daily at 6 PM.  . [DISCONTINUED] canagliflozin (INVOKANA) 100 MG TABS tablet Take 1 tablet (100 mg total) by mouth daily.  . [DISCONTINUED] cephALEXin (KEFLEX) 500 MG capsule Take 1 capsule (500 mg total) by mouth 4 (four) times daily.   No facility-administered encounter medications on file as of 04/18/2016.     Follow-up: Return in about 3 months (around 07/16/2016) for Hemoglobin A1C and Follow up, DM, Follow up HTN, Follow up Pain and comorbidities.   Beckey Rutter, AGNP Student  Evaluation and management procedures were performed by me with DNP Student in attendance, note written by DNP student under my supervision and collaboration. I have reviewed the note and I agree with the management and plan.   Angelica Chessman, MD, [place], [place], [place], [place] and [place], [place]   04/21/2016, 5:58 PM

## 2016-04-18 NOTE — Patient Instructions (Signed)
Diabetes Mellitus and Food It is important for you to manage your blood sugar (glucose) level. Your blood glucose level can be greatly affected by what you eat. Eating healthier foods in the appropriate amounts throughout the day at about the same time each day will help you control your blood glucose level. It can also help slow or prevent worsening of your diabetes mellitus. Healthy eating may even help you improve the level of your blood pressure and reach or maintain a healthy weight. General recommendations for healthful eating and cooking habits include:  Eating meals and snacks regularly. Avoid going long periods of time without eating to lose weight.  Eating a diet that consists mainly of plant-based foods, such as fruits, vegetables, nuts, legumes, and whole grains.  Using low-heat cooking methods, such as baking, instead of high-heat cooking methods, such as deep frying.  Work with your dietitian to make sure you understand how to use the Nutrition Facts information on food labels. How can food affect me? Carbohydrates Carbohydrates affect your blood glucose level more than any other type of food. Your dietitian will help you determine how many carbohydrates to eat at each meal and teach you how to count carbohydrates. Counting carbohydrates is important to keep your blood glucose at a healthy level, especially if you are using insulin or taking certain medicines for diabetes mellitus. Alcohol Alcohol can cause sudden decreases in blood glucose (hypoglycemia), especially if you use insulin or take certain medicines for diabetes mellitus. Hypoglycemia can be a life-threatening condition. Symptoms of hypoglycemia (sleepiness, dizziness, and disorientation) are similar to symptoms of having too much alcohol. If your health care provider has given you approval to drink alcohol, do so in moderation and use the following guidelines:  Women should not have more than one drink per day, and men  should not have more than two drinks per day. One drink is equal to: ? 12 oz of beer. ? 5 oz of wine. ? 1 oz of hard liquor.  Do not drink on an empty stomach.  Keep yourself hydrated. Have water, diet soda, or unsweetened iced tea.  Regular soda, juice, and other mixers might contain a lot of carbohydrates and should be counted.  What foods are not recommended? As you make food choices, it is important to remember that all foods are not the same. Some foods have fewer nutrients per serving than other foods, even though they might have the same number of calories or carbohydrates. It is difficult to get your body what it needs when you eat foods with fewer nutrients. Examples of foods that you should avoid that are high in calories and carbohydrates but low in nutrients include:  Trans fats (most processed foods list trans fats on the Nutrition Facts label).  Regular soda.  Juice.  Candy.  Sweets, such as cake, pie, doughnuts, and cookies.  Fried foods.  What foods can I eat? Eat nutrient-rich foods, which will nourish your body and keep you healthy. The food you should eat also will depend on several factors, including:  The calories you need.  The medicines you take.  Your weight.  Your blood glucose level.  Your blood pressure level.  Your cholesterol level.  You should eat a variety of foods, including:  Protein. ? Lean cuts of meat. ? Proteins low in saturated fats, such as fish, egg whites, and beans. Avoid processed meats.  Fruits and vegetables. ? Fruits and vegetables that may help control blood glucose levels, such as apples,   yams.  Dairy products.  Choose fat-free or low-fat dairy products, such as milk, yogurt, and cheese.  Grains, bread, pasta, and rice.  Choose whole grain products, such as multigrain bread, whole oats, and brown rice. These foods may help control blood pressure.  Fats.  Foods containing healthful fats, such as nuts,  avocado, olive oil, canola oil, and fish. Does everyone with diabetes mellitus have the same meal plan? Because every person with diabetes mellitus is different, there is not one meal plan that works for everyone. It is very important that you meet with a dietitian who will help you create a meal plan that is just right for you. This information is not intended to replace advice given to you by your health care provider. Make sure you discuss any questions you have with your health care provider. Document Released: 11/02/2004 Document Revised: 07/14/2015 Document Reviewed: 01/02/2013 Elsevier Interactive Patient Education  2017 Elsevier Inc. Diabetes Mellitus and Exercise Exercising regularly is important for your overall health, especially when you have diabetes (diabetes mellitus). Exercising is not only about losing weight. It has many health benefits, such as increasing muscle strength and bone density and reducing body fat and stress. This leads to improved fitness, flexibility, and endurance, all of which result in better overall health. Exercise has additional benefits for people with diabetes, including:  Reducing appetite.  Helping to lower and control blood glucose.  Lowering blood pressure.  Helping to control amounts of fatty substances (lipids) in the blood, such as cholesterol and triglycerides.  Helping the body to respond better to insulin (improving insulin sensitivity).  Reducing how much insulin the body needs.  Decreasing the risk for heart disease by:  Lowering cholesterol and triglyceride levels.  Increasing the levels of good cholesterol.  Lowering blood glucose levels. What is my activity plan? Your health care provider or certified diabetes educator can help you make a plan for the type and frequency of exercise (activity plan) that works for you. Make sure that you:  Do at least 150 minutes of moderate-intensity or vigorous-intensity exercise each week. This  could be brisk walking, biking, or water aerobics.  Do stretching and strength exercises, such as yoga or weightlifting, at least 2 times a week.  Spread out your activity over at least 3 days of the week.  Get some form of physical activity every day.  Do not go more than 2 days in a row without some kind of physical activity.  Avoid being inactive for more than 90 minutes at a time. Take frequent breaks to walk or stretch.  Choose a type of exercise or activity that you enjoy, and set realistic goals.  Start slowly, and gradually increase the intensity of your exercise over time. What do I need to know about managing my diabetes?  Check your blood glucose before and after exercising.  If your blood glucose is higher than 240 mg/dL (13.3 mmol/L) before you exercise, check your urine for ketones. If you have ketones in your urine, do not exercise until your blood glucose returns to normal.  Know the symptoms of low blood glucose (hypoglycemia) and how to treat it. Your risk for hypoglycemia increases during and after exercise. Common symptoms of hypoglycemia can include:  Hunger.  Anxiety.  Sweating and feeling clammy.  Confusion.  Dizziness or feeling light-headed.  Increased heart rate or palpitations.  Blurry vision.  Tingling or numbness around the mouth, lips, or tongue.  Tremors or shakes.  Irritability.  Keep a rapid-acting   carbohydrate snack available before, during, and after exercise to help prevent or treat hypoglycemia.  Avoid injecting insulin into areas of the body that are going to be exercised. For example, avoid injecting insulin into:  The arms, when playing tennis.  The legs, when jogging.  Keep records of your exercise habits. Doing this can help you and your health care provider adjust your diabetes management plan as needed. Write down:  Food that you eat before and after you exercise.  Blood glucose levels before and after you  exercise.  The type and amount of exercise you have done.  When your insulin is expected to peak, if you use insulin. Avoid exercising at times when your insulin is peaking.  When you start a new exercise or activity, work with your health care provider to make sure the activity is safe for you, and to adjust your insulin, medicines, or food intake as needed.  Drink plenty of water while you exercise to prevent dehydration or heat stroke. Drink enough fluid to keep your urine clear or pale yellow. This information is not intended to replace advice given to you by your health care provider. Make sure you discuss any questions you have with your health care provider. Document Released: 04/28/2003 Document Revised: 08/26/2015 Document Reviewed: 07/18/2015 Elsevier Interactive Patient Education  2017 Elsevier Inc. Blood Glucose Monitoring, Adult Monitoring your blood sugar (glucose) helps you manage your diabetes. It also helps you and your health care provider determine how well your diabetes management plan is working. Blood glucose monitoring involves checking your blood glucose as often as directed, and keeping a record (log) of your results over time. Why should I monitor my blood glucose? Checking your blood glucose regularly can:  Help you understand how food, exercise, illnesses, and medicines affect your blood glucose.  Let you know what your blood glucose is at any time. You can quickly tell if you are having low blood glucose (hypoglycemia) or high blood glucose (hyperglycemia).  Help you and your health care provider adjust your medicines as needed. When should I check my blood glucose? Follow instructions from your health care provider about how often to check your blood glucose. This may depend on:  The type of diabetes you have.  How well-controlled your diabetes is.  Medicines you are taking. If you have type 1 diabetes:   Check your blood glucose at least 2 times a  day.  Also check your blood glucose:  Before every insulin injection.  Before and after exercise.  Between meals.  2 hours after a meal.  Occasionally between 2:00 a.m. and 3:00 a.m., as directed.  Before potentially dangerous tasks, like driving or using heavy machinery.  At bedtime.  You may need to check your blood glucose more often, up to 6-10 times a day:  If you use an insulin pump.  If you need multiple daily injections (MDI).  If your diabetes is not well-controlled.  If you are ill.  If you have a history of severe hypoglycemia.  If you have a history of not knowing when your blood glucose is getting low (hypoglycemia unawareness). If you have type 2 diabetes:   If you take insulin or other diabetes medicines, check your blood glucose at least 2 times a day.  If you are on intensive insulin therapy, check your blood glucose at least 4 times a day. Occasionally, you may also need to check between 2:00 a.m. and 3:00 a.m., as directed.  Also check your blood glucose:    Before and after exercise.  Before potentially dangerous tasks, like driving or using heavy machinery.  You may need to check your blood glucose more often if:  Your medicine is being adjusted.  Your diabetes is not well-controlled.  You are ill. What is a blood glucose log?  A blood glucose log is a record of your blood glucose readings. It helps you and your health care provider:  Look for patterns in your blood glucose over time.  Adjust your diabetes management plan as needed.  Every time you check your blood glucose, write down your result and notes about things that may be affecting your blood glucose, such as your diet and exercise for the day.  Most glucose meters store a record of glucose readings in the meter. Some meters allow you to download your records to a computer. How do I check my blood glucose? Follow these steps to get accurate readings of your blood  glucose: Supplies needed    Blood glucose meter.  Test strips for your meter. Each meter has its own strips. You must use the strips that come with your meter.  A needle to prick your finger (lancet). Do not use lancets more than once.  A device that holds the lancet (lancing device).  A journal or log book to write down your results. Procedure   Wash your hands with soap and water.  Prick the side of your finger (not the tip) with the lancet. Use a different finger each time.  Gently rub the finger until a small drop of blood appears.  Follow instructions that come with your meter for inserting the test strip, applying blood to the strip, and using your blood glucose meter.  Write down your result and any notes. Alternative testing sites   Some meters allow you to use areas of your body other than your finger (alternative sites) to test your blood.  If you think you may have hypoglycemia, or if you have hypoglycemia unawareness, do not use alternative sites. Use your finger instead.  Alternative sites may not be as accurate as the fingers, because blood flow is slower in these areas. This means that the result you get may be delayed, and it may be different from the result that you would get from your finger.  The most common alternative sites are:  Forearm.  Thigh.  Palm of the hand. Additional tips   Always keep your supplies with you.  If you have questions or need help, all blood glucose meters have a 24-hour "hotline" number that you can call. You may also contact your health care provider.  After you use a few boxes of test strips, adjust (calibrate) your blood glucose meter by following instructions that came with your meter. This information is not intended to replace advice given to you by your health care provider. Make sure you discuss any questions you have with your health care provider. Document Released: 02/08/2003 Document Revised: 08/26/2015 Document  Reviewed: 07/18/2015 Elsevier Interactive Patient Education  2017 Elsevier Inc.  

## 2016-04-20 ENCOUNTER — Encounter: Payer: Self-pay | Admitting: Internal Medicine

## 2016-04-23 ENCOUNTER — Other Ambulatory Visit: Payer: Self-pay | Admitting: Pharmacist

## 2016-04-23 MED ORDER — SITAGLIPTIN PHOSPHATE 100 MG PO TABS: 100.0000 mg | ORAL_TABLET | Freq: Every day | ORAL | 3 refills | Status: DC

## 2016-05-07 ENCOUNTER — Telehealth: Payer: Self-pay | Admitting: Internal Medicine

## 2016-05-07 NOTE — Telephone Encounter (Signed)
FYI to PCP

## 2016-05-07 NOTE — Telephone Encounter (Signed)
Samantha from [facility] called to infom PCP that patient was a no show to his appt. They tried calling patient to reschedule but got no response.   Thank you.

## 2016-05-09 ENCOUNTER — Telehealth: Payer: Self-pay | Admitting: Internal Medicine

## 2016-05-09 NOTE — Telephone Encounter (Signed)
Patient called the office to inform PCP that the infection on his head came back and therefore needs the same medication (as previously prescribed) to be called in to [facility] on [address]. Pt mentioned that he had informed PCP of this in last appt. Please follow up.  Thank you.

## 2016-05-09 NOTE — Telephone Encounter (Signed)
Patient is requesting a cream to use on his head.

## 2016-05-15 ENCOUNTER — Other Ambulatory Visit: Payer: Self-pay | Admitting: Internal Medicine

## 2016-05-15 DIAGNOSIS — B354 Tinea corporis: Secondary | ICD-10-CM

## 2016-05-15 MED ORDER — TERBINAFINE HCL 1 % EX CREA: 1.0000 "application " | TOPICAL_CREAM | Freq: Two times a day (BID) | CUTANEOUS | 3 refills | Status: DC

## 2016-05-15 NOTE — Telephone Encounter (Signed)
Ordered. Sent to [facility]

## 2016-06-07 ENCOUNTER — Ambulatory Visit (INDEPENDENT_AMBULATORY_CARE_PROVIDER_SITE_OTHER): Payer: BC Managed Care – PPO

## 2016-06-07 ENCOUNTER — Encounter (HOSPITAL_COMMUNITY): Payer: Self-pay | Admitting: Family Medicine

## 2016-06-07 ENCOUNTER — Ambulatory Visit (HOSPITAL_COMMUNITY)
Admission: EM | Admit: 2016-06-07 | Discharge: 2016-06-07 | Disposition: A | Discharge status: Home / Self Care | Payer: BC Managed Care – PPO | Attending: Family Medicine | Admitting: Family Medicine

## 2016-06-07 DIAGNOSIS — S60222A Contusion of left hand, initial encounter: Secondary | ICD-10-CM

## 2016-06-07 DIAGNOSIS — M79642 Pain in left hand: Secondary | ICD-10-CM | POA: Diagnosis not present

## 2016-06-07 MED ORDER — KETOROLAC TROMETHAMINE 60 MG/2ML IM SOLN
INTRAMUSCULAR | Status: AC
  Filled 2016-06-07: qty 2

## 2016-06-07 MED ORDER — NAPROXEN 500 MG PO TABS: 500.0000 mg | ORAL_TABLET | Freq: Two times a day (BID) | ORAL | 0 refills | Status: DC

## 2016-06-07 MED ORDER — KETOROLAC TROMETHAMINE 60 MG/2ML IM SOLN
60.0000 mg | Freq: Once | INTRAMUSCULAR | Status: AC
  Administered 2016-06-07: 60 mg via INTRAMUSCULAR

## 2016-06-07 NOTE — ED Triage Notes (Signed)
Pt here for injury to left hand.

## 2016-06-07 NOTE — ED Provider Notes (Signed)
CSN: [id number]     Arrival date & time 06/07/16  1910 History   First MD Initiated Contact with Patient 06/07/16 1926     Chief Complaint  Patient presents with  . Hand Injury   (Consider location/radiation/quality/duration/timing/severity/associated sxs/prior Treatment) Patient was changing a tire and tire iron slipped and he injured his left hand.   The history is provided by the patient.  Hand Injury  Location:  Hand Hand location:  L hand Injury: yes   Mechanism of injury: crush   Crush injury:    Mechanism:  Hand tool   Duration of crushing force:  1 hour Pain details:    Quality:  Aching   Past Medical History:  Diagnosis Date  . Diabetes mellitus without complication ([place])   . Hyperlipidemia   . Hypertension    History reviewed. No pertinent surgical history. Family History  Problem Relation Age of Onset  . Diabetes Mother   . Diabetes Father    Social History  Substance Use Topics  . Smoking status: Never Smoker  . Smokeless tobacco: Never Used  . Alcohol use No    Review of Systems  Constitutional: Negative.   HENT: Negative.   Eyes: Negative.   Respiratory: Negative.   Cardiovascular: Negative.   Gastrointestinal: Negative.   Endocrine: Negative.   Genitourinary: Negative.   Musculoskeletal: Positive for arthralgias.  Neurological: Negative.     Allergies  Patient has no known allergies.  Home Medications   Prior to Admission medications   Medication Sig Start Date End Date Taking? Authorizing Provider  amLODipine (NORVASC) 10 MG tablet Take 1 tablet (10 mg total) by mouth daily. 12/22/15   Tresa Garter, MD  gabapentin (NEURONTIN) 100 MG capsule Take 1 capsule (100 mg total) by mouth 3 (three) times daily. 12/22/15   Tresa Garter, MD  glipiZIDE (GLIPIZIDE XL) 10 MG 24 hr tablet Take 1 tablet (10 mg total) by mouth daily with breakfast. 12/22/15   Tresa Garter, MD  ibuprofen (ADVIL,MOTRIN) 600 MG tablet Take 1 tablet (600  mg total) by mouth 3 (three) times daily with meals as needed for moderate pain. 06/30/15   Julianne Rice, MD  lisinopril-hydrochlorothiazide (PRINZIDE,ZESTORETIC) 20-12.5 MG tablet Take 1 tablet by mouth daily. 12/22/15   Tresa Garter, MD  metFORMIN (GLUCOPHAGE) 1000 MG tablet Take 1 tablet (1,000 mg total) by mouth 2 (two) times daily with a meal. 12/22/15   Tresa Garter, MD  naproxen (NAPROSYN) 500 MG tablet Take 1 tablet (500 mg total) by mouth 2 (two) times daily with a meal. 06/07/16   Lysbeth Penner, FNP  saxagliptin HCl (ONGLYZA) 2.5 MG TABS tablet Take 1 tablet (2.5 mg total) by mouth daily. 04/18/16   Tresa Garter, MD  simvastatin (ZOCOR) 40 MG tablet Take 1 tablet (40 mg total) by mouth daily at 6 PM. 12/22/15   Olugbemiga E Doreene Burke, MD  sitaGLIPtin (JANUVIA) 100 MG tablet Take 1 tablet (100 mg total) by mouth daily. 04/23/16   Tresa Garter, MD  terbinafine (LAMISIL AT) 1 % cream Apply 1 application topically 2 (two) times daily. 05/15/16   Tresa Garter, MD   Meds Ordered and Administered this Visit   Medications  ketorolac (TORADOL) injection 60 mg (60 mg Intramuscular Given 06/07/16 2007)    BP (!) 154/68   Pulse (!) 108   Temp 98.6 F (37 C)   Resp 18   SpO2 97%  No data found.   Physical Exam  Constitutional: He appears well-developed and well-nourished.  HENT:  Head: Normocephalic and atraumatic.  Eyes: Conjunctivae and EOM are normal. Pupils are equal, round, and reactive to light.  Neck: Normal range of motion. Neck supple.  Cardiovascular: Normal rate, regular rhythm and normal heart sounds.   Pulmonary/Chest: Effort normal and breath sounds normal.  Musculoskeletal: He exhibits tenderness.  TTP left hand no deformity and no swelling.  Nursing note and vitals reviewed.   Urgent Care Course     Procedures (including critical care time)  Labs Review Labs Reviewed - No data to display  Imaging Review Dg Hand Complete  Left  Result Date: 06/07/2016 CLINICAL DATA:  Left hand pain after injury today. EXAM: LEFT HAND - COMPLETE 3+ VIEW COMPARISON:  None. FINDINGS: There is no evidence of fracture or dislocation. There is no evidence of arthropathy or other focal bone abnormality. Soft tissues are unremarkable. IMPRESSION: Normal left hand. Electronically Signed   By: Marijo Conception, M.D.   On: 06/07/2016 19:42     Visual Acuity Review  Right Eye Distance:   Left Eye Distance:   Bilateral Distance:    Right Eye Near:   Left Eye Near:    Bilateral Near:         MDM   1. Contusion of left hand, initial encounter    Toradol 60mg  IM Naprosyn 500mg  one po bid x 10 days [place], [place] 06/07/16 2031

## 2016-07-18 ENCOUNTER — Ambulatory Visit: Payer: BC Managed Care – PPO | Admitting: Internal Medicine

## 2016-08-01 ENCOUNTER — Ambulatory Visit: Payer: BC Managed Care – PPO | Admitting: Internal Medicine

## 2016-08-15 ENCOUNTER — Encounter: Payer: Self-pay | Admitting: Internal Medicine

## 2016-08-15 ENCOUNTER — Ambulatory Visit: Payer: BC Managed Care – PPO | Attending: Internal Medicine | Admitting: Internal Medicine

## 2016-08-15 VITALS — BP 121/77 | HR 97 | Temp 98.1°F | Resp 18 | Ht 72.0 in | Wt 289.4 lb

## 2016-08-15 DIAGNOSIS — E785 Hyperlipidemia, unspecified: Secondary | ICD-10-CM | POA: Diagnosis not present

## 2016-08-15 DIAGNOSIS — E0800 Diabetes mellitus due to underlying condition with hyperosmolarity without nonketotic hyperglycemic-hyperosmolar coma (NKHHC): Secondary | ICD-10-CM

## 2016-08-15 DIAGNOSIS — Z7984 Long term (current) use of oral hypoglycemic drugs: Secondary | ICD-10-CM | POA: Diagnosis not present

## 2016-08-15 DIAGNOSIS — K061 Gingival enlargement: Secondary | ICD-10-CM | POA: Diagnosis not present

## 2016-08-15 DIAGNOSIS — E119 Type 2 diabetes mellitus without complications: Secondary | ICD-10-CM | POA: Insufficient documentation

## 2016-08-15 DIAGNOSIS — I10 Essential (primary) hypertension: Secondary | ICD-10-CM | POA: Diagnosis not present

## 2016-08-15 DIAGNOSIS — N529 Male erectile dysfunction, unspecified: Secondary | ICD-10-CM | POA: Diagnosis not present

## 2016-08-15 LAB — POCT GLYCOSYLATED HEMOGLOBIN (HGB A1C): HEMOGLOBIN A1C: 10.1

## 2016-08-15 LAB — GLUCOSE, POCT (MANUAL RESULT ENTRY): POC Glucose: 246 mg/dl — AB (ref 70–99)

## 2016-08-15 MED ORDER — EMPAGLIFLOZIN 25 MG PO TABS: 25.0000 mg | ORAL_TABLET | Freq: Every day | ORAL | 3 refills | Status: DC

## 2016-08-15 NOTE — Progress Notes (Signed)
Anthony Vincent, is a 46 y.o. male  BZJ:[id number]  OFB:[id number]  DOB - [birth year]    Subjective:   Anthony Vincent is a 46 y.o. male with history of type 2 diabetes mellitus without complications, hyperlipidemia and hypertension here today for a routine follow up visit and dental referral. Patient has been having some gum bleeding and swelling for few months especially on the left side, he needs to see a dentist as soon as possible. Patient claims adherence with his diabetic regimen yet blood sugar has been all over the place, mostly high. HbA1C has gone up to 10.1% today from 9.4% previously. Patient does not exercise regularly. BP is controlled. He has no other significant complain today. Patient has No headache, No chest pain, No abdominal pain - No Nausea, No new weakness tingling or numbness, No Cough - SOB.  Problem  Erectile Dysfunction  Gingival Hypertrophy    ALLERGIES: No Known Allergies  PAST MEDICAL HISTORY: Past Medical History:  Diagnosis Date  . Diabetes mellitus without complication ([place])   . Hyperlipidemia   . Hypertension     MEDICATIONS AT HOME: Prior to Admission medications   Medication Sig Start Date End Date Taking? Authorizing Provider  amLODipine (NORVASC) 10 MG tablet Take 1 tablet (10 mg total) by mouth daily. 12/22/15  Yes Tresa Garter, MD  gabapentin (NEURONTIN) 100 MG capsule Take 1 capsule (100 mg total) by mouth 3 (three) times daily. 12/22/15  Yes Tresa Garter, MD  glipiZIDE (GLIPIZIDE XL) 10 MG 24 hr tablet Take 1 tablet (10 mg total) by mouth daily with breakfast. 12/22/15  Yes Aliese Brannum E, MD  lisinopril-hydrochlorothiazide (PRINZIDE,ZESTORETIC) 20-12.5 MG tablet Take 1 tablet by mouth daily. 12/22/15  Yes Tresa Garter, MD  metFORMIN (GLUCOPHAGE) 1000 MG tablet Take 1 tablet (1,000 mg total) by mouth 2 (two) times daily with a meal. 12/22/15  Yes Jeramey Lanuza E, MD  simvastatin (ZOCOR) 40 MG tablet Take 1  tablet (40 mg total) by mouth daily at 6 PM. 12/22/15  Yes Dinesha Twiggs E, MD  sitaGLIPtin (JANUVIA) 100 MG tablet Take 1 tablet (100 mg total) by mouth daily. 04/23/16  Yes Tresa Garter, MD  empagliflozin (JARDIANCE) 25 MG TABS tablet Take 25 mg by mouth daily. 08/15/16   Tresa Garter, MD    Objective:   Vitals:   08/15/16 1102  BP: 121/77  Pulse: 97  Resp: 18  Temp: 98.1 F (36.7 C)  TempSrc: Oral  SpO2: 96%  Weight: 289 lb 6.4 oz (131.3 kg)  Height: 6' (1.829 m)   Exam General appearance : Awake, alert, not in any distress. Speech Clear. Not toxic looking HEENT: Atraumatic and Normocephalic, pupils equally reactive to light and accomodation Neck: Supple, no JVD. No cervical lymphadenopathy.  Chest: Good air entry bilaterally, no added sounds  CVS: S1 S2 regular, no murmurs.  Abdomen: Bowel sounds present, Non tender and not distended with no gaurding, rigidity or rebound. Extremities: B/L Lower Ext shows no edema, both legs are warm to touch Neurology: Awake alert, and oriented X 3, CN II-XII intact, Non focal Skin: No Rash  Data Review Lab Results  Component Value Date   HGBA1C 10.1 08/15/2016   HGBA1C 9.4 04/18/2016   HGBA1C 9.4 10/20/2015    Assessment & Plan   1. Diabetes mellitus due to underlying condition without long-term current use of insulin (HCC)  - Blood sugar uncontrolled - POCT A1C - Glucose (CBG) - Continue Glipizide and Metformin  Add -  empagliflozin (JARDIANCE) 25 MG TABS tablet; Take 25 mg by mouth daily.  Dispense: 30 tablet; Refill: 3 - Urinalysis, Complete  2. Essential hypertension, benign  - CMP14+EGFR - TSH  3. Dyslipidemia  - Lipid panel  To address this please limit saturated fat to no more than 7% of your calories, limit cholesterol to 200 mg/day, increase fiber and exercise as tolerated. If needed we may add another cholesterol lowering medication to your regimen.   4. Erectile dysfunction, unspecified  erectile dysfunction type  - Testosterone  5. Gingival hypertrophy  - Ambulatory referral to Dentistry  Patient have been counseled extensively about nutrition and exercise. Other issues discussed during this visit include: low cholesterol diet, weight control and daily exercise, foot care, annual eye examinations at Ophthalmology, importance of adherence with medications and regular follow-up. We also discussed long term complications of uncontrolled diabetes and hypertension.   Return in about 3 months (around 11/15/2016) for Hemoglobin A1C and Follow up, DM, Follow up HTN, Dyslipidemia.  The patient was given clear instructions to go to ER or return to medical center if symptoms don't improve, worsen or new problems develop. The patient verbalized understanding. The patient was told to call to get lab results if they haven't heard anything in the next week.   This note has been created with Surveyor, quantity. Any transcriptional errors are unintentional.    Angelica Chessman, MD, [place], Karilyn Cota, [place] and [facility] [place], [place]   08/15/2016, 11:39 AM

## 2016-08-15 NOTE — Patient Instructions (Addendum)
Diabetes and Foot Care Diabetes may cause you to have problems because of poor blood supply (circulation) to your feet and legs. This may cause the skin on your feet to become thinner, break easier, and heal more slowly. Your skin may become dry, and the skin may peel and crack. You may also have nerve damage in your legs and feet causing decreased feeling in them. You may not notice minor injuries to your feet that could lead to infections or more serious problems. Taking care of your feet is one of the most important things you can do for yourself. Follow these instructions at home:  Wear shoes at all times, even in the house. Do not go barefoot. Bare feet are easily injured.  Check your feet daily for blisters, cuts, and redness. If you cannot see the bottom of your feet, use a mirror or ask someone for help.  Wash your feet with warm water (do not use hot water) and mild soap. Then pat your feet and the areas between your toes until they are completely dry. Do not soak your feet as this can dry your skin.  Apply a moisturizing lotion or petroleum jelly (that does not contain alcohol and is unscented) to the skin on your feet and to dry, brittle toenails. Do not apply lotion between your toes.  Trim your toenails straight across. Do not dig under them or around the cuticle. File the edges of your nails with an emery board or nail file.  Do not cut corns or calluses or try to remove them with medicine.  Wear clean socks or stockings every day. Make sure they are not too tight. Do not wear knee-high stockings since they may decrease blood flow to your legs.  Wear shoes that fit properly and have enough cushioning. To break in new shoes, wear them for just a few hours a day. This prevents you from injuring your feet. Always look in your shoes before you put them on to be sure there are no objects inside.  Do not cross your legs. This may decrease the blood flow to your feet.  If you find a  minor scrape, cut, or break in the skin on your feet, keep it and the skin around it clean and dry. These areas may be cleansed with mild soap and water. Do not cleanse the area with peroxide, alcohol, or iodine.  When you remove an adhesive bandage, be sure not to damage the skin around it.  If you have a wound, look at it several times a day to make sure it is healing.  Do not use heating pads or hot water bottles. They may burn your skin. If you have lost feeling in your feet or legs, you may not know it is happening until it is too late.  Make sure your health care provider performs a complete foot exam at least annually or more often if you have foot problems. Report any cuts, sores, or bruises to your health care provider immediately. Contact a health care provider if:  You have an injury that is not healing.  You have cuts or breaks in the skin.  You have an ingrown nail.  You notice redness on your legs or feet.  You feel burning or tingling in your legs or feet.  You have pain or cramps in your legs and feet.  Your legs or feet are numb.  Your feet always feel cold. Get help right away if:  There is increasing   redness, swelling, or pain in or around a wound.  There is a red line that goes up your leg.  Pus is coming from a wound.  You develop a fever or as directed by your health care provider.  You notice a bad smell coming from an ulcer or wound. This information is not intended to replace advice given to you by your health care provider. Make sure you discuss any questions you have with your health care provider. Document Released: 02/03/2000 Document Revised: 07/14/2015 Document Reviewed: 07/15/2012 Elsevier Interactive Patient Education  2017 [place]. Diabetes Mellitus and Exercise Exercising regularly is important for your overall health, especially when you have diabetes (diabetes mellitus). Exercising is not only about losing weight. It has many health  benefits, such as increasing muscle strength and bone density and reducing body fat and stress. This leads to improved fitness, flexibility, and endurance, all of which result in better overall health. Exercise has additional benefits for people with diabetes, including:  Reducing appetite.  Helping to lower and control blood glucose.  Lowering blood pressure.  Helping to control amounts of fatty substances (lipids) in the blood, such as cholesterol and triglycerides.  Helping the body to respond better to insulin (improving insulin sensitivity).  Reducing how much insulin the body needs.  Decreasing the risk for heart disease by: ? Lowering cholesterol and triglyceride levels. ? Increasing the levels of good cholesterol. ? Lowering blood glucose levels.  What is my activity plan? Your health care provider or certified diabetes educator can help you make a plan for the type and frequency of exercise (activity plan) that works for you. Make sure that you:  Do at least 150 minutes of moderate-intensity or vigorous-intensity exercise each week. This could be brisk walking, biking, or water aerobics. ? Do stretching and strength exercises, such as yoga or weightlifting, at least 2 times a week. ? Spread out your activity over at least 3 days of the week.  Get some form of physical activity every day. ? Do not go more than 2 days in a row without some kind of physical activity. ? Avoid being inactive for more than 90 minutes at a time. Take frequent breaks to walk or stretch.  Choose a type of exercise or activity that you enjoy, and set realistic goals.  Start slowly, and gradually increase the intensity of your exercise over time.  What do I need to know about managing my diabetes?  Check your blood glucose before and after exercising. ? If your blood glucose is higher than 240 mg/dL (13.3 mmol/L) before you exercise, check your urine for ketones. If you have ketones in your urine,  do not exercise until your blood glucose returns to normal.  Know the symptoms of low blood glucose (hypoglycemia) and how to treat it. Your risk for hypoglycemia increases during and after exercise. Common symptoms of hypoglycemia can include: ? Hunger. ? Anxiety. ? Sweating and feeling clammy. ? Confusion. ? Dizziness or feeling light-headed. ? Increased heart rate or palpitations. ? Blurry vision. ? Tingling or numbness around the mouth, lips, or tongue. ? Tremors or shakes. ? Irritability.  Keep a rapid-acting carbohydrate snack available before, during, and after exercise to help prevent or treat hypoglycemia.  Avoid injecting insulin into areas of the body that are going to be exercised. For example, avoid injecting insulin into: ? The arms, when playing tennis. ? The legs, when jogging.  Keep records of your exercise habits. Doing this can help you and  your health care provider adjust your diabetes management plan as needed. Write down: ? Food that you eat before and after you exercise. ? Blood glucose levels before and after you exercise. ? The type and amount of exercise you have done. ? When your insulin is expected to peak, if you use insulin. Avoid exercising at times when your insulin is peaking.  When you start a new exercise or activity, work with your health care provider to make sure the activity is safe for you, and to adjust your insulin, medicines, or food intake as needed.  Drink plenty of water while you exercise to prevent dehydration or heat stroke. Drink enough fluid to keep your urine clear or pale yellow. This information is not intended to replace advice given to you by your health care provider. Make sure you discuss any questions you have with your health care provider. Document Released: 04/28/2003 Document Revised: 08/26/2015 Document Reviewed: 07/18/2015 Elsevier Interactive Patient Education  2018 Else[employer]. Blood Glucose Monitoring,  Adult Monitoring your blood sugar (glucose) helps you manage your diabetes. It also helps you and your health care provider determine how well your diabetes management plan is working. Blood glucose monitoring involves checking your blood glucose as often as directed, and keeping a record (log) of your results over time. Why should I monitor my blood glucose? Checking your blood glucose regularly can:  Help you understand how food, exercise, illnesses, and medicines affect your blood glucose.  Let you know what your blood glucose is at any time. You can quickly tell if you are having low blood glucose (hypoglycemia) or high blood glucose (hyperglycemia).  Help you and your health care provider adjust your medicines as needed.  When should I check my blood glucose? Follow instructions from your health care provider about how often to check your blood glucose. This may depend on:  The type of diabetes you have.  How well-controlled your diabetes is.  Medicines you are taking.  If you have type 1 diabetes:  Check your blood glucose at least 2 times a day.  Also check your blood glucose: ? Before every insulin injection. ? Before and after exercise. ? Between meals. ? 2 hours after a meal. ? Occasionally between 2:00 a.m. and 3:00 a.m., as directed. ? Before potentially dangerous tasks, like driving or using heavy machinery. ? At bedtime.  You may need to check your blood glucose more often, up to 6-10 times a day: ? If you use an insulin pump. ? If you need multiple daily injections (MDI). ? If your diabetes is not well-controlled. ? If you are ill. ? If you have a history of severe hypoglycemia. ? If you have a history of not knowing when your blood glucose is getting low (hypoglycemia unawareness). If you have type 2 diabetes:  If you take insulin or other diabetes medicines, check your blood glucose at least 2 times a day.  If you are on intensive insulin therapy, check  your blood glucose at least 4 times a day. Occasionally, you may also need to check between 2:00 a.m. and 3:00 a.m., as directed.  Also check your blood glucose: ? Before and after exercise. ? Before potentially dangerous tasks, like driving or using heavy machinery.  You may need to check your blood glucose more often if: ? Your medicine is being adjusted. ? Your diabetes is not well-controlled. ? You are ill. What is a blood glucose log?  A blood glucose log is a record of   your blood glucose readings. It helps you and your health care provider: ? Look for patterns in your blood glucose over time. ? Adjust your diabetes management plan as needed.  Every time you check your blood glucose, write down your result and notes about things that may be affecting your blood glucose, such as your diet and exercise for the day.  Most glucose meters store a record of glucose readings in the meter. Some meters allow you to download your records to a computer. How do I check my blood glucose? Follow these steps to get accurate readings of your blood glucose: Supplies needed   Blood glucose meter.  Test strips for your meter. Each meter has its own strips. You must use the strips that come with your meter.  A needle to prick your finger (lancet). Do not use lancets more than once.  A device that holds the lancet (lancing device).  A journal or log book to write down your results. Procedure  Wash your hands with soap and water.  Prick the side of your finger (not the tip) with the lancet. Use a different finger each time.  Gently rub the finger until a small drop of blood appears.  Follow instructions that come with your meter for inserting the test strip, applying blood to the strip, and using your blood glucose meter.  Write down your result and any notes. Alternative testing sites  Some meters allow you to use areas of your body other than your finger (alternative sites) to test  your blood.  If you think you may have hypoglycemia, or if you have hypoglycemia unawareness, do not use alternative sites. Use your finger instead.  Alternative sites may not be as accurate as the fingers, because blood flow is slower in these areas. This means that the result you get may be delayed, and it may be different from the result that you would get from your finger.  The most common alternative sites are: ? Forearm. ? Thigh. ? Palm of the hand. Additional tips  Always keep your supplies with you.  If you have questions or need help, all blood glucose meters have a 24-hour "hotline" number that you can call. You may also contact your health care provider.  After you use a few boxes of test strips, adjust (calibrate) your blood glucose meter by following instructions that came with your meter. This information is not intended to replace advice given to you by your health care provider. Make sure you discuss any questions you have with your health care provider. Document Released: 02/08/2003 Document Revised: 08/26/2015 Document Reviewed: 07/18/2015 Elsevier Interactive Patient Education  2017 [employer]vier Inc.  

## 2016-08-16 ENCOUNTER — Telehealth: Payer: Self-pay | Admitting: *Deleted

## 2016-08-16 LAB — CMP14+EGFR
ALT: 37 IU/L (ref 0–44)
AST: 18 IU/L (ref 0–40)
Albumin/Globulin Ratio: 1.6 (ref 1.2–2.2)
Albumin: 4.6 g/dL (ref 3.5–5.5)
Alkaline Phosphatase: 72 IU/L (ref 39–117)
BILIRUBIN TOTAL: 0.8 mg/dL (ref 0.0–1.2)
BUN/Creatinine Ratio: 10 (ref 9–20)
BUN: 10 mg/dL (ref 6–24)
CALCIUM: 9.7 mg/dL (ref 8.7–10.2)
CHLORIDE: 100 mmol/L (ref 96–106)
CO2: 22 mmol/L (ref 20–29)
CREATININE: 0.96 mg/dL (ref 0.76–1.27)
GFR calc non Af Amer: 94 mL/min/{1.73_m2} (ref >59)
GFR, EST AFRICAN AMERICAN: 108 mL/min/{1.73_m2} (ref >59)
GLUCOSE: 245 mg/dL — AB (ref 65–99)
Globulin, Total: 2.9 g/dL (ref 1.5–4.5)
Potassium: 4.7 mmol/L (ref 3.5–5.2)
Sodium: 140 mmol/L (ref 134–144)
Total Protein: 7.5 g/dL (ref 6.0–8.5)

## 2016-08-16 LAB — LIPID PANEL
CHOLESTEROL TOTAL: 141 mg/dL (ref 100–199)
Chol/HDL Ratio: 5 ratio (ref 0.0–5.0)
HDL: 28 mg/dL — AB (ref >39)
LDL CALC: 98 mg/dL (ref 0–99)
Triglycerides: 77 mg/dL (ref 0–149)
VLDL CHOLESTEROL CAL: 15 mg/dL (ref 5–40)

## 2016-08-16 LAB — TSH: TSH: 0.711 u[IU]/mL (ref 0.450–4.500)

## 2016-08-16 LAB — URINALYSIS, COMPLETE
Bilirubin, UA: NEGATIVE
LEUKOCYTES UA: NEGATIVE
NITRITE UA: NEGATIVE
RBC, UA: NEGATIVE
Specific Gravity, UA: >=1.03 — AB (ref 1.005–1.030)
Urobilinogen, Ur: 0.2 mg/dL (ref 0.2–1.0)
pH, UA: 5.5 (ref 5.0–7.5)

## 2016-08-16 LAB — MICROSCOPIC EXAMINATION
BACTERIA UA: NONE SEEN
CASTS: NONE SEEN /LPF

## 2016-08-16 LAB — TESTOSTERONE: Testosterone: 377 ng/dL (ref 264–916)

## 2016-08-16 NOTE — Telephone Encounter (Signed)
-----   Message from Tresa Garter, MD sent at 08/16/2016 12:59 PM EDT ----- Labs are essentially normal, testosterone level is normal. Please keep up with blood sugar control to reduce complications of uncontrolled diabetes. Urine analysis showed no evidence of infection

## 2016-08-16 NOTE — Telephone Encounter (Signed)
Medical Assistant left message on patient's home and cell voicemail. Voicemail states to give a call back to [place] with [facility] at [phone]. Patient is aware of labs being normal including testosterone. Patient advised to control blood sugar to avoid any complications with uncontrolled diabetes. Patient is also aware of urine showing no evidence of infection.

## 2016-12-05 ENCOUNTER — Ambulatory Visit: Payer: BC Managed Care – PPO | Admitting: Internal Medicine

## 2016-12-10 ENCOUNTER — Ambulatory Visit: Payer: BC Managed Care – PPO | Attending: Internal Medicine | Admitting: Internal Medicine

## 2016-12-10 ENCOUNTER — Encounter: Payer: Self-pay | Admitting: Internal Medicine

## 2016-12-10 VITALS — BP 121/75 | HR 91 | Temp 98.4°F | Resp 18 | Ht 72.0 in | Wt 288.0 lb

## 2016-12-10 DIAGNOSIS — Z7984 Long term (current) use of oral hypoglycemic drugs: Secondary | ICD-10-CM | POA: Insufficient documentation

## 2016-12-10 DIAGNOSIS — E0800 Diabetes mellitus due to underlying condition with hyperosmolarity without nonketotic hyperglycemic-hyperosmolar coma (NKHHC): Secondary | ICD-10-CM | POA: Diagnosis not present

## 2016-12-10 DIAGNOSIS — Z1211 Encounter for screening for malignant neoplasm of colon: Secondary | ICD-10-CM

## 2016-12-10 DIAGNOSIS — E119 Type 2 diabetes mellitus without complications: Secondary | ICD-10-CM | POA: Insufficient documentation

## 2016-12-10 DIAGNOSIS — E870 Hyperosmolality and hypernatremia: Secondary | ICD-10-CM | POA: Insufficient documentation

## 2016-12-10 DIAGNOSIS — I10 Essential (primary) hypertension: Secondary | ICD-10-CM | POA: Insufficient documentation

## 2016-12-10 DIAGNOSIS — E785 Hyperlipidemia, unspecified: Secondary | ICD-10-CM | POA: Insufficient documentation

## 2016-12-10 DIAGNOSIS — N529 Male erectile dysfunction, unspecified: Secondary | ICD-10-CM | POA: Insufficient documentation

## 2016-12-10 DIAGNOSIS — Z23 Encounter for immunization: Secondary | ICD-10-CM | POA: Insufficient documentation

## 2016-12-10 DIAGNOSIS — Z79899 Other long term (current) drug therapy: Secondary | ICD-10-CM | POA: Insufficient documentation

## 2016-12-10 LAB — GLUCOSE, POCT (MANUAL RESULT ENTRY): POC Glucose: 207 mg/dl — AB (ref 70–99)

## 2016-12-10 LAB — POCT GLYCOSYLATED HEMOGLOBIN (HGB A1C): HEMOGLOBIN A1C: 9

## 2016-12-10 MED ORDER — SILDENAFIL CITRATE 50 MG PO TABS: 50.0000 mg | ORAL_TABLET | Freq: Every day | ORAL | 0 refills | Status: DC | PRN

## 2016-12-10 MED ORDER — GABAPENTIN 100 MG PO CAPS: 100.0000 mg | ORAL_CAPSULE | Freq: Three times a day (TID) | ORAL | 3 refills | Status: DC

## 2016-12-10 MED ORDER — METFORMIN HCL 1000 MG PO TABS: 1000.0000 mg | ORAL_TABLET | Freq: Two times a day (BID) | ORAL | 3 refills | Status: DC

## 2016-12-10 MED ORDER — SIMVASTATIN 40 MG PO TABS: 40.0000 mg | ORAL_TABLET | Freq: Every day | ORAL | 3 refills | Status: DC

## 2016-12-10 MED ORDER — SITAGLIPTIN PHOSPHATE 100 MG PO TABS: 100.0000 mg | ORAL_TABLET | Freq: Every day | ORAL | 3 refills | Status: DC

## 2016-12-10 MED ORDER — EMPAGLIFLOZIN 25 MG PO TABS: 25.0000 mg | ORAL_TABLET | Freq: Every day | ORAL | 3 refills | Status: DC

## 2016-12-10 MED ORDER — GLIPIZIDE ER 10 MG PO TB24: 10.0000 mg | ORAL_TABLET | Freq: Every day | ORAL | 3 refills | Status: DC

## 2016-12-10 MED ORDER — AMLODIPINE BESYLATE 10 MG PO TABS: 10.0000 mg | ORAL_TABLET | Freq: Every day | ORAL | 3 refills | Status: DC

## 2016-12-10 MED ORDER — LISINOPRIL-HYDROCHLOROTHIAZIDE 20-12.5 MG PO TABS: 1.0000 | ORAL_TABLET | Freq: Every day | ORAL | 3 refills | Status: DC

## 2016-12-10 NOTE — Progress Notes (Signed)
Subjective:  Patient ID: Anthony Vincent, male    DOB: 18-Apr-1970  Age: 46 y.o. MRN: [id number]  CC: Diabetes  HPI Anthony Vincent is a 46 year old male with a history of  hypertension, diabetes, and erectile dysfunction presents to the clinic for follow-up. He has no complaints and reports feeling well. He denies headaches, blurred vision, chest pains, and loss of sensation of lower extremities. He completed an ophthalmology exam about 6 months ago and tries to avoid foods high in sugar. He is requesting refills on all his medications today.   Past Medical History:  Diagnosis Date  . Diabetes mellitus without complication ([place])   . Hyperlipidemia   . Hypertension    History reviewed. No pertinent surgical history.  Outpatient Medications Prior to Visit  Medication Sig Dispense Refill  . amLODipine (NORVASC) 10 MG tablet Take 1 tablet (10 mg total) by mouth daily. 90 tablet 3  . empagliflozin (JARDIANCE) 25 MG TABS tablet Take 25 mg by mouth daily. 30 tablet 3  . gabapentin (NEURONTIN) 100 MG capsule Take 1 capsule (100 mg total) by mouth 3 (three) times daily. 270 capsule 3  . glipiZIDE (GLIPIZIDE XL) 10 MG 24 hr tablet Take 1 tablet (10 mg total) by mouth daily with breakfast. 90 tablet 3  . lisinopril-hydrochlorothiazide (PRINZIDE,ZESTORETIC) 20-12.5 MG tablet Take 1 tablet by mouth daily. 90 tablet 3  . metFORMIN (GLUCOPHAGE) 1000 MG tablet Take 1 tablet (1,000 mg total) by mouth 2 (two) times daily with a meal. 180 tablet 3  . simvastatin (ZOCOR) 40 MG tablet Take 1 tablet (40 mg total) by mouth daily at 6 PM. 90 tablet 3  . sitaGLIPtin (JANUVIA) 100 MG tablet Take 1 tablet (100 mg total) by mouth daily. 30 tablet 3   No facility-administered medications prior to visit.     ROS Review of Systems  General: negative for fever, weight loss, appetite change Eyes: no visual symptoms. ENT: no ear symptoms, no sinus tenderness, no nasal congestion or sore throat. Respiratory: no  wheezing, shortness of breath, cough Cardiovascular: no chest pain, no dyspnea on exertion, no pedal edema, no orthopnea. Gastrointestinal: no abdominal pain, no diarrhea, no constipation Genito-Urinary: no urinary frequency, no dysuria, no polyuria. Neurological: no headaches, no seizures, no tremors Musculoskeletal: no joint pains, no joint swelling Skin: no pruritus, no rash. Psychological: no depression, no anxiety,    Objective:  BP 121/75 (BP Location: Left Arm, Patient Position: Sitting, Cuff Size: Large)   Pulse 91   Temp 98.4 F (36.9 C) (Oral)   Resp 18   Ht 6' (1.829 m)   Wt 288 lb (130.6 kg)   SpO2 96%   BMI 39.06 kg/m   BP/Weight 12/10/2016 08/15/2016 2/58/5277  Systolic BP 824 235 361  Diastolic BP 75 77 68  Wt. (Lbs) 288 289.4 -  BMI 39.06 39.25 -    Physical Exam  Constitutional: He is oriented to person, place, and time. He appears well-developed and well-nourished. No distress.  HENT:  Head: Normocephalic.  Neck: Normal range of motion.  Cardiovascular: Normal rate, regular rhythm and normal heart sounds.   Pulmonary/Chest: Effort normal and breath sounds normal. No respiratory distress.  Abdominal: Soft. Bowel sounds are normal. He exhibits no distension. There is no tenderness.  Musculoskeletal: Normal range of motion.  Neurological: He is alert and oriented to person, place, and time.  Skin: Skin is warm and dry.  Vitals reviewed.   Lab Results  Component Value Date   HGBA1C 9.0  12/10/2016     Assessment & Plan:   1. Diabetes mellitus due to underlying condition with hyperosmolarity without coma, without long-term current use of insulin (HCC) Improved.  HGB A1C decreased from 10.1 to 9.0 today. Discussed lifestyle changes including reduction in sugar and carbs. . - POCT A1C - Glucose (CBG) - Flu Vaccine QUAD 6+ mos PF IM (Fluarix Quad PF) - empagliflozin (JARDIANCE) 25 MG TABS tablet; Take 25 mg by mouth daily.  Dispense: 30 tablet; Refill:  3 - gabapentin (NEURONTIN) 100 MG capsule; Take 1 capsule (100 mg total) by mouth 3 (three) times daily.  Dispense: 270 capsule; Refill: 3 - glipiZIDE (GLIPIZIDE XL) 10 MG 24 hr tablet; Take 1 tablet (10 mg total) by mouth daily with breakfast.  Dispense: 90 tablet; Refill: 3 - metFORMIN (GLUCOPHAGE) 1000 MG tablet; Take 1 tablet (1,000 mg total) by mouth 2 (two) times daily with a meal.  Dispense: 180 tablet; Refill: 3 - sitaGLIPtin (JANUVIA) 100 MG tablet; Take 1 tablet (100 mg total) by mouth daily.  Dispense: 30 tablet; Refill: 3  2. Essential hypertension, benign Controlled We have discussed target BP range and blood pressure goal. I have advised patient to check BP regularly and to call us back or report to clinic if the numbers are consistently higher than 140/90. We discussed the importance of compliance with medical therapy and DASH diet recommended, consequences of uncontrolled hypertension discussed.  - continue current BP medications  - amLODipine (NORVASC) 10 MG tablet; Take 1 tablet (10 mg total) by mouth daily.  Dispense: 90 tablet; Refill: 3 - lisinopril-hydrochlorothiazide (PRINZIDE,ZESTORETIC) 20-12.5 MG tablet; Take 1 tablet by mouth daily.  Dispense: 90 tablet; Refill: 3  3. Dyslipidemia - simvastatin (ZOCOR) 40 MG tablet; Take 1 tablet (40 mg total) by mouth daily at 6 PM.  Dispense: 90 tablet; Refill: 3  4. Erectile dysfunction, unspecified erectile dysfunction type - sildenafil (VIAGRA) 50 MG tablet; Take 1 tablet (50 mg total) by mouth daily as needed for erectile dysfunction.  Dispense: 20 tablet; Refill: 0  5. Colon cancer screening - Ambulatory referral to Gastroenterology   Meds ordered this encounter  Medications  . amLODipine (NORVASC) 10 MG tablet    Sig: Take 1 tablet (10 mg total) by mouth daily.    Dispense:  90 tablet    Refill:  3  . empagliflozin (JARDIANCE) 25 MG TABS tablet    Sig: Take 25 mg by mouth daily.    Dispense:  30 tablet    Refill:  3   . gabapentin (NEURONTIN) 100 MG capsule    Sig: Take 1 capsule (100 mg total) by mouth 3 (three) times daily.    Dispense:  270 capsule    Refill:  3  . glipiZIDE (GLIPIZIDE XL) 10 MG 24 hr tablet    Sig: Take 1 tablet (10 mg total) by mouth daily with breakfast.    Dispense:  90 tablet    Refill:  3  . lisinopril-hydrochlorothiazide (PRINZIDE,ZESTORETIC) 20-12.5 MG tablet    Sig: Take 1 tablet by mouth daily.    Dispense:  90 tablet    Refill:  3  . metFORMIN (GLUCOPHAGE) 1000 MG tablet    Sig: Take 1 tablet (1,000 mg total) by mouth 2 (two) times daily with a meal.    Dispense:  180 tablet    Refill:  3  . simvastatin (ZOCOR) 40 MG tablet    Sig: Take 1 tablet (40 mg total) by mouth daily at 6 PM.  Dispense:  90 tablet    Refill:  3  . sitaGLIPtin (JANUVIA) 100 MG tablet    Sig: Take 1 tablet (100 mg total) by mouth daily.    Dispense:  30 tablet    Refill:  3    To replace Onglyza - not covered by insurance.  . sildenafil (VIAGRA) 50 MG tablet    Sig: Take 1 tablet (50 mg total) by mouth daily as needed for erectile dysfunction.    Dispense:  20 tablet    Refill:  0    Follow-up: Return in about 3 months (around 03/12/2017) for Hemoglobin A1C and Follow up, DM, Follow up HTN.    Evaluation and management procedures were performed by me with DNP Student in attendance, note written by DNP student under my supervision and collaboration. I have reviewed the note and I agree with the management and plan.   Angelica Chessman, MD, [place], [place], [place], [place] and [place], [place]   12/18/2016, 5:39 PM

## 2016-12-10 NOTE — Patient Instructions (Signed)
Diabetes Mellitus and Food It is important for you to manage your blood sugar (glucose) level. Your blood glucose level can be greatly affected by what you eat. Eating healthier foods in the appropriate amounts throughout the day at about the same time each day will help you control your blood glucose level. It can also help slow or prevent worsening of your diabetes mellitus. Healthy eating may even help you improve the level of your blood pressure and reach or maintain a healthy weight. General recommendations for healthful eating and cooking habits include:  Eating meals and snacks regularly. Avoid going long periods of time without eating to lose weight.  Eating a diet that consists mainly of plant-based foods, such as fruits, vegetables, nuts, legumes, and whole grains.  Using low-heat cooking methods, such as baking, instead of high-heat cooking methods, such as deep frying.  Work with your dietitian to make sure you understand how to use the Nutrition Facts information on food labels. How can food affect me? Carbohydrates Carbohydrates affect your blood glucose level more than any other type of food. Your dietitian will help you determine how many carbohydrates to eat at each meal and teach you how to count carbohydrates. Counting carbohydrates is important to keep your blood glucose at a healthy level, especially if you are using insulin or taking certain medicines for diabetes mellitus. Alcohol Alcohol can cause sudden decreases in blood glucose (hypoglycemia), especially if you use insulin or take certain medicines for diabetes mellitus. Hypoglycemia can be a life-threatening condition. Symptoms of hypoglycemia (sleepiness, dizziness, and disorientation) are similar to symptoms of having too much alcohol. If your health care provider has given you approval to drink alcohol, do so in moderation and use the following guidelines:  Women should not have more than one drink per day, and men  should not have more than two drinks per day. One drink is equal to: ? 12 oz of beer. ? 5 oz of wine. ? 1 oz of hard liquor.  Do not drink on an empty stomach.  Keep yourself hydrated. Have water, diet soda, or unsweetened iced tea.  Regular soda, juice, and other mixers might contain a lot of carbohydrates and should be counted.  What foods are not recommended? As you make food choices, it is important to remember that all foods are not the same. Some foods have fewer nutrients per serving than other foods, even though they might have the same number of calories or carbohydrates. It is difficult to get your body what it needs when you eat foods with fewer nutrients. Examples of foods that you should avoid that are high in calories and carbohydrates but low in nutrients include:  Trans fats (most processed foods list trans fats on the Nutrition Facts label).  Regular soda.  Juice.  Candy.  Sweets, such as cake, pie, doughnuts, and cookies.  Fried foods.  What foods can I eat? Eat nutrient-rich foods, which will nourish your body and keep you healthy. The food you should eat also will depend on several factors, including:  The calories you need.  The medicines you take.  Your weight.  Your blood glucose level.  Your blood pressure level.  Your cholesterol level.  You should eat a variety of foods, including:  Protein. ? Lean cuts of meat. ? Proteins low in saturated fats, such as fish, egg whites, and beans. Avoid processed meats.  Fruits and vegetables. ? Fruits and vegetables that may help control blood glucose levels, such as apples,   mangoes, and yams.  Dairy products. ? Choose fat-free or low-fat dairy products, such as milk, yogurt, and cheese.  Grains, bread, pasta, and rice. ? Choose whole grain products, such as multigrain bread, whole oats, and brown rice. These foods may help control blood pressure.  Fats. ? Foods containing healthful fats, such as  nuts, avocado, olive oil, canola oil, and fish.  Does everyone with diabetes mellitus have the same meal plan? Because every person with diabetes mellitus is different, there is not one meal plan that works for everyone. It is very important that you meet with a dietitian who will help you create a meal plan that is just right for you. This information is not intended to replace advice given to you by your health care provider. Make sure you discuss any questions you have with your health care provider. Document Released: 11/02/2004 Document Revised: 07/14/2015 Document Reviewed: 01/02/2013 Elsevier Interactive Patient Education  2017 [employer] Diabetes Mellitus and Exercise Exercising regularly is important for your overall health, especially when you have diabetes (diabetes mellitus). Exercising is not only about losing weight. It has many health benefits, such as increasing muscle strength and bone density and reducing body fat and stress. This leads to improved fitness, flexibility, and endurance, all of which result in better overall health. Exercise has additional benefits for people with diabetes, including:  Reducing appetite.  Helping to lower and control blood glucose.  Lowering blood pressure.  Helping to control amounts of fatty substances (lipids) in the blood, such as cholesterol and triglycerides.  Helping the body to respond better to insulin (improving insulin sensitivity).  Reducing how much insulin the body needs.  Decreasing the risk for heart disease by: ? Lowering cholesterol and triglyceride levels. ? Increasing the levels of good cholesterol. ? Lowering blood glucose levels.  What is my activity plan? Your health care provider or certified diabetes educator can help you make a plan for the type and frequency of exercise (activity plan) that works for you. Make sure that you:  Do at least 150 minutes of moderate-intensity or vigorous-intensity exercise each  week. This could be brisk walking, biking, or water aerobics. ? Do stretching and strength exercises, such as yoga or weightlifting, at least 2 times a week. ? Spread out your activity over at least 3 days of the week.  Get some form of physical activity every day. ? Do not go more than 2 days in a row without some kind of physical activity. ? Avoid being inactive for more than 90 minutes at a time. Take frequent breaks to walk or stretch.  Choose a type of exercise or activity that you enjoy, and set realistic goals.  Start slowly, and gradually increase the intensity of your exercise over time.  What do I need to know about managing my diabetes?  Check your blood glucose before and after exercising. ? If your blood glucose is higher than 240 mg/dL (13.3 mmol/L) before you exercise, check your urine for ketones. If you have ketones in your urine, do not exercise until your blood glucose returns to normal.  Know the symptoms of low blood glucose (hypoglycemia) and how to treat it. Your risk for hypoglycemia increases during and after exercise. Common symptoms of hypoglycemia can include: ? Hunger. ? Anxiety. ? Sweating and feeling clammy. ? Confusion. ? Dizziness or feeling light-headed. ? Increased heart rate or palpitations. ? Blurry vision. ? Tingling or numbness around the mouth, lips, or tongue. ? Tremors or shakes. ?   Irritability.  Keep a rapid-acting carbohydrate snack available before, during, and after exercise to help prevent or treat hypoglycemia.  Avoid injecting insulin into areas of the body that are going to be exercised. For example, avoid injecting insulin into: ? The arms, when playing tennis. ? The legs, when jogging.  Keep records of your exercise habits. Doing this can help you and your health care provider adjust your diabetes management plan as needed. Write down: ? Food that you eat before and after you exercise. ? Blood glucose levels before and after you  exercise. ? The type and amount of exercise you have done. ? When your insulin is expected to peak, if you use insulin. Avoid exercising at times when your insulin is peaking.  When you start a new exercise or activity, work with your health care provider to make sure the activity is safe for you, and to adjust your insulin, medicines, or food intake as needed.  Drink plenty of water while you exercise to prevent dehydration or heat stroke. Drink enough fluid to keep your urine clear or pale yellow. This information is not intended to replace advice given to you by your health care provider. Make sure you discuss any questions you have with your health care provider. Document Released: 04/28/2003 Document Revised: 08/26/2015 Document Reviewed: 07/18/2015 Elsevier Interactive Patient Education  2018 [employer]. Blood Glucose Monitoring, Adult Monitoring your blood sugar (glucose) helps you manage your diabetes. It also helps you and your health care provider determine how well your diabetes management plan is working. Blood glucose monitoring involves checking your blood glucose as often as directed, and keeping a record (log) of your results over time. Why should I monitor my blood glucose? Checking your blood glucose regularly can:  Help you understand how food, exercise, illnesses, and medicines affect your blood glucose.  Let you know what your blood glucose is at any time. You can quickly tell if you are having low blood glucose (hypoglycemia) or high blood glucose (hyperglycemia).  Help you and your health care provider adjust your medicines as needed.  When should I check my blood glucose? Follow instructions from your health care provider about how often to check your blood glucose. This may depend on:  The type of diabetes you have.  How well-controlled your diabetes is.  Medicines you are taking.  If you have type 1 diabetes:  Check your blood glucose at least 2 times a  day.  Also check your blood glucose: ? Before every insulin injection. ? Before and after exercise. ? Between meals. ? 2 hours after a meal. ? Occasionally between 2:00 a.m. and 3:00 a.m., as directed. ? Before potentially dangerous tasks, like driving or using heavy machinery. ? At bedtime.  You may need to check your blood glucose more often, up to 6-10 times a day: ? If you use an insulin pump. ? If you need multiple daily injections (MDI). ? If your diabetes is not well-controlled. ? If you are ill. ? If you have a history of severe hypoglycemia. ? If you have a history of not knowing when your blood glucose is getting low (hypoglycemia unawareness). If you have type 2 diabetes:  If you take insulin or other diabetes medicines, check your blood glucose at least 2 times a day.  If you are on intensive insulin therapy, check your blood glucose at least 4 times a day. Occasionally, you may also need to check between 2:00 a.m. and 3:00 a.m., as directed.    Also check your blood glucose: ? Before and after exercise. ? Before potentially dangerous tasks, like driving or using heavy machinery.  You may need to check your blood glucose more often if: ? Your medicine is being adjusted. ? Your diabetes is not well-controlled. ? You are ill. What is a blood glucose log?  A blood glucose log is a record of your blood glucose readings. It helps you and your health care provider: ? Look for patterns in your blood glucose over time. ? Adjust your diabetes management plan as needed.  Every time you check your blood glucose, write down your result and notes about things that may be affecting your blood glucose, such as your diet and exercise for the day.  Most glucose meters store a record of glucose readings in the meter. Some meters allow you to download your records to a computer. How do I check my blood glucose? Follow these steps to get accurate readings of your blood  glucose: Supplies needed   Blood glucose meter.  Test strips for your meter. Each meter has its own strips. You must use the strips that come with your meter.  A needle to prick your finger (lancet). Do not use lancets more than once.  A device that holds the lancet (lancing device).  A journal or log book to write down your results. Procedure  Wash your hands with soap and water.  Prick the side of your finger (not the tip) with the lancet. Use a different finger each time.  Gently rub the finger until a small drop of blood appears.  Follow instructions that come with your meter for inserting the test strip, applying blood to the strip, and using your blood glucose meter.  Write down your result and any notes. Alternative testing sites  Some meters allow you to use areas of your body other than your finger (alternative sites) to test your blood.  If you think you may have hypoglycemia, or if you have hypoglycemia unawareness, do not use alternative sites. Use your finger instead.  Alternative sites may not be as accurate as the fingers, because blood flow is slower in these areas. This means that the result you get may be delayed, and it may be different from the result that you would get from your finger.  The most common alternative sites are: ? Forearm. ? Thigh. ? Palm of the hand. Additional tips  Always keep your supplies with you.  If you have questions or need help, all blood glucose meters have a 24-hour "hotline" number that you can call. You may also contact your health care provider.  After you use a few boxes of test strips, adjust (calibrate) your blood glucose meter by following instructions that came with your meter. This information is not intended to replace advice given to you by your health care provider. Make sure you discuss any questions you have with your health care provider. Document Released: 02/08/2003 Document Revised: 08/26/2015 Document  Reviewed: 07/18/2015 Elsevier Interactive Patient Education  2017 [employer]  

## 2017-01-17 ENCOUNTER — Encounter: Payer: Self-pay | Admitting: Internal Medicine

## 2017-03-13 ENCOUNTER — Ambulatory Visit: Payer: BC Managed Care – PPO | Admitting: Internal Medicine

## 2017-03-27 ENCOUNTER — Other Ambulatory Visit: Payer: Self-pay

## 2017-03-27 ENCOUNTER — Encounter: Payer: Self-pay | Admitting: Internal Medicine

## 2017-03-27 ENCOUNTER — Ambulatory Visit: Payer: BC Managed Care – PPO | Attending: Internal Medicine | Admitting: Internal Medicine

## 2017-03-27 VITALS — BP 129/84 | HR 87 | Temp 98.7°F | Resp 16 | Ht 72.0 in | Wt 293.0 lb

## 2017-03-27 DIAGNOSIS — I10 Essential (primary) hypertension: Secondary | ICD-10-CM | POA: Insufficient documentation

## 2017-03-27 DIAGNOSIS — E0800 Diabetes mellitus due to underlying condition with hyperosmolarity without nonketotic hyperglycemic-hyperosmolar coma (NKHHC): Secondary | ICD-10-CM

## 2017-03-27 DIAGNOSIS — E785 Hyperlipidemia, unspecified: Secondary | ICD-10-CM

## 2017-03-27 DIAGNOSIS — Z794 Long term (current) use of insulin: Secondary | ICD-10-CM | POA: Insufficient documentation

## 2017-03-27 DIAGNOSIS — E119 Type 2 diabetes mellitus without complications: Secondary | ICD-10-CM | POA: Diagnosis not present

## 2017-03-27 DIAGNOSIS — N529 Male erectile dysfunction, unspecified: Secondary | ICD-10-CM | POA: Diagnosis not present

## 2017-03-27 DIAGNOSIS — Z79899 Other long term (current) drug therapy: Secondary | ICD-10-CM | POA: Diagnosis not present

## 2017-03-27 LAB — POCT GLYCOSYLATED HEMOGLOBIN (HGB A1C): Hemoglobin A1C: 9.2

## 2017-03-27 LAB — GLUCOSE, POCT (MANUAL RESULT ENTRY): POC GLUCOSE: 158 mg/dL — AB (ref 70–99)

## 2017-03-27 MED ORDER — AMLODIPINE BESYLATE 10 MG PO TABS: 10.0000 mg | ORAL_TABLET | Freq: Every day | ORAL | 3 refills | Status: DC

## 2017-03-27 MED ORDER — SITAGLIPTIN PHOSPHATE 100 MG PO TABS: 100.0000 mg | ORAL_TABLET | Freq: Every day | ORAL | 3 refills | Status: DC

## 2017-03-27 MED ORDER — GLIPIZIDE ER 10 MG PO TB24: 10.0000 mg | ORAL_TABLET | Freq: Every day | ORAL | 3 refills | Status: DC

## 2017-03-27 MED ORDER — GABAPENTIN 100 MG PO CAPS: 100.0000 mg | ORAL_CAPSULE | Freq: Three times a day (TID) | ORAL | 3 refills | Status: DC

## 2017-03-27 MED ORDER — SIMVASTATIN 40 MG PO TABS: 40.0000 mg | ORAL_TABLET | Freq: Every day | ORAL | 3 refills | Status: DC

## 2017-03-27 MED ORDER — DULAGLUTIDE 0.75 MG/0.5ML ~~LOC~~ SOAJ: 0.7500 mg | SUBCUTANEOUS | 3 refills | Status: DC

## 2017-03-27 MED ORDER — SILDENAFIL CITRATE 50 MG PO TABS: 50.0000 mg | ORAL_TABLET | Freq: Every day | ORAL | 1 refills | Status: DC | PRN

## 2017-03-27 MED ORDER — EMPAGLIFLOZIN 25 MG PO TABS: 25.0000 mg | ORAL_TABLET | Freq: Every day | ORAL | 3 refills | Status: DC

## 2017-03-27 MED ORDER — LISINOPRIL-HYDROCHLOROTHIAZIDE 20-12.5 MG PO TABS: 1.0000 | ORAL_TABLET | Freq: Every day | ORAL | 3 refills | Status: DC

## 2017-03-27 MED ORDER — METFORMIN HCL 1000 MG PO TABS: 1000.0000 mg | ORAL_TABLET | Freq: Two times a day (BID) | ORAL | 3 refills | Status: DC

## 2017-03-27 NOTE — Patient Instructions (Signed)
Diabetes Mellitus and Nutrition When you have diabetes (diabetes mellitus), it is very important to have healthy eating habits because your blood sugar (glucose) levels are greatly affected by what you eat and drink. Eating healthy foods in the appropriate amounts, at about the same times every day, can help you:  Control your blood glucose.  Lower your risk of heart disease.  Improve your blood pressure.  Reach or maintain a healthy weight.  Every person with diabetes is different, and each person has different needs for a meal plan. Your health care provider may recommend that you work with a diet and nutrition specialist (dietitian) to make a meal plan that is best for you. Your meal plan may vary depending on factors such as:  The calories you need.  The medicines you take.  Your weight.  Your blood glucose, blood pressure, and cholesterol levels.  Your activity level.  Other health conditions you have, such as heart or kidney disease.  How do carbohydrates affect me? Carbohydrates affect your blood glucose level more than any other type of food. Eating carbohydrates naturally increases the amount of glucose in your blood. Carbohydrate counting is a method for keeping track of how many carbohydrates you eat. Counting carbohydrates is important to keep your blood glucose at a healthy level, especially if you use insulin or take certain oral diabetes medicines. It is important to know how many carbohydrates you can safely have in each meal. This is different for every person. Your dietitian can help you calculate how many carbohydrates you should have at each meal and for snack. Foods that contain carbohydrates include:  Bread, cereal, rice, pasta, and crackers.  Potatoes and corn.  Peas, beans, and lentils.  Milk and yogurt.  Fruit and juice.  Desserts, such as cakes, cookies, ice cream, and candy.  How does alcohol affect me? Alcohol can cause a sudden decrease in blood  glucose (hypoglycemia), especially if you use insulin or take certain oral diabetes medicines. Hypoglycemia can be a life-threatening condition. Symptoms of hypoglycemia (sleepiness, dizziness, and confusion) are similar to symptoms of having too much alcohol. If your health care provider says that alcohol is safe for you, follow these guidelines:  Limit alcohol intake to no more than 1 drink per day for nonpregnant women and 2 drinks per day for men. One drink equals 12 oz of beer, 5 oz of wine, or 1 oz of hard liquor.  Do not drink on an empty stomach.  Keep yourself hydrated with water, diet soda, or unsweetened iced tea.  Keep in mind that regular soda, juice, and other mixers may contain a lot of sugar and must be counted as carbohydrates.  What are tips for following this plan? Reading food labels  Start by checking the serving size on the label. The amount of calories, carbohydrates, fats, and other nutrients listed on the label are based on one serving of the food. Many foods contain more than one serving per package.  Check the total grams (g) of carbohydrates in one serving. You can calculate the number of servings of carbohydrates in one serving by dividing the total carbohydrates by 15. For example, if a food has 30 g of total carbohydrates, it would be equal to 2 servings of carbohydrates.  Check the number of grams (g) of saturated and trans fats in one serving. Choose foods that have low or no amount of these fats.  Check the number of milligrams (mg) of sodium in one serving. Most people   should limit total sodium intake to less than 2,300 mg per day.  Always check the nutrition information of foods labeled as "low-fat" or "nonfat". These foods may be higher in added sugar or refined carbohydrates and should be avoided.  Talk to your dietitian to identify your daily goals for nutrients listed on the label. Shopping  Avoid buying canned, premade, or processed foods. These  foods tend to be high in fat, sodium, and added sugar.  Shop around the outside edge of the grocery store. This includes fresh fruits and vegetables, bulk grains, fresh meats, and fresh dairy. Cooking  Use low-heat cooking methods, such as baking, instead of high-heat cooking methods like deep frying.  Cook using healthy oils, such as olive, canola, or sunflower oil.  Avoid cooking with butter, cream, or high-fat meats. Meal planning  Eat meals and snacks regularly, preferably at the same times every day. Avoid going long periods of time without eating.  Eat foods high in fiber, such as fresh fruits, vegetables, beans, and whole grains. Talk to your dietitian about how many servings of carbohydrates you can eat at each meal.  Eat 4-6 ounces of lean protein each day, such as lean meat, chicken, fish, eggs, or tofu. 1 ounce is equal to 1 ounce of meat, chicken, or fish, 1 egg, or 1/4 cup of tofu.  Eat some foods each day that contain healthy fats, such as avocado, nuts, seeds, and fish. Lifestyle   Check your blood glucose regularly.  Exercise at least 30 minutes 5 or more days each week, or as told by your health care provider.  Take medicines as told by your health care provider.  Do not use any products that contain nicotine or tobacco, such as cigarettes and e-cigarettes. If you need help quitting, ask your health care provider.  Work with a Social worker or diabetes educator to identify strategies to manage stress and any emotional and social challenges. What are some questions to ask my health care provider?  Do I need to meet with a diabetes educator?  Do I need to meet with a dietitian?  What number can I call if I have questions?  When are the best times to check my blood glucose? Where to find more information:  American Diabetes Association: diabetes.org/food-and-fitness/food  Academy of Nutrition and Dietetics:  PokerClues.dk  [employer] of Diabetes and Digestive and Kidney Diseases (NIH): ContactWire.be Summary  A healthy meal plan will help you control your blood glucose and maintain a healthy lifestyle.  Working with a diet and nutrition specialist (dietitian) can help you make a meal plan that is best for you.  Keep in mind that carbohydrates and alcohol have immediate effects on your blood glucose levels. It is important to count carbohydrates and to use alcohol carefully. This information is not intended to replace advice given to you by your health care provider. Make sure you discuss any questions you have with your health care provider. Document Released: 11/02/2004 Document Revised: 03/12/2016 Document Reviewed: 03/12/2016 Elsevier Interactive Patient Education  2018 [employer].  Diabetes Mellitus and Exercise Exercising regularly is important for your overall health, especially when you have diabetes (diabetes mellitus). Exercising is not only about losing weight. It has many health benefits, such as increasing muscle strength and bone density and reducing body fat and stress. This leads to improved fitness, flexibility, and endurance, all of which result in better overall health. Exercise has additional benefits for people with diabetes, including:  Reducing appetite.  Helping to  lower and control blood glucose.  Lowering blood pressure.  Helping to control amounts of fatty substances (lipids) in the blood, such as cholesterol and triglycerides.  Helping the body to respond better to insulin (improving insulin sensitivity).  Reducing how much insulin the body needs.  Decreasing the risk for heart disease by: ? Lowering cholesterol and triglyceride levels. ? Increasing the levels of good cholesterol. ? Lowering blood glucose levels.  What is my  activity plan? Your health care provider or certified diabetes educator can help you make a plan for the type and frequency of exercise (activity plan) that works for you. Make sure that you:  Do at least 150 minutes of moderate-intensity or vigorous-intensity exercise each week. This could be brisk walking, biking, or water aerobics. ? Do stretching and strength exercises, such as yoga or weightlifting, at least 2 times a week. ? Spread out your activity over at least 3 days of the week.  Get some form of physical activity every day. ? Do not go more than 2 days in a row without some kind of physical activity. ? Avoid being inactive for more than 90 minutes at a time. Take frequent breaks to walk or stretch.  Choose a type of exercise or activity that you enjoy, and set realistic goals.  Start slowly, and gradually increase the intensity of your exercise over time.  What do I need to know about managing my diabetes?  Check your blood glucose before and after exercising. ? If your blood glucose is higher than 240 mg/dL (13.3 mmol/L) before you exercise, check your urine for ketones. If you have ketones in your urine, do not exercise until your blood glucose returns to normal.  Know the symptoms of low blood glucose (hypoglycemia) and how to treat it. Your risk for hypoglycemia increases during and after exercise. Common symptoms of hypoglycemia can include: ? Hunger. ? Anxiety. ? Sweating and feeling clammy. ? Confusion. ? Dizziness or feeling light-headed. ? Increased heart rate or palpitations. ? Blurry vision. ? Tingling or numbness around the mouth, lips, or tongue. ? Tremors or shakes. ? Irritability.  Keep a rapid-acting carbohydrate snack available before, during, and after exercise to help prevent or treat hypoglycemia.  Avoid injecting insulin into areas of the body that are going to be exercised. For example, avoid injecting insulin into: ? The arms, when playing  tennis. ? The legs, when jogging.  Keep records of your exercise habits. Doing this can help you and your health care provider adjust your diabetes management plan as needed. Write down: ? Food that you eat before and after you exercise. ? Blood glucose levels before and after you exercise. ? The type and amount of exercise you have done. ? When your insulin is expected to peak, if you use insulin. Avoid exercising at times when your insulin is peaking.  When you start a new exercise or activity, work with your health care provider to make sure the activity is safe for you, and to adjust your insulin, medicines, or food intake as needed.  Drink plenty of water while you exercise to prevent dehydration or heat stroke. Drink enough fluid to keep your urine clear or pale yellow. This information is not intended to replace advice given to you by your health care provider. Make sure you discuss any questions you have with your health care provider. Document Released: 04/28/2003 Document Revised: 08/26/2015 Document Reviewed: 07/18/2015 Elsevier Interactive Patient Education  2018 [place]  DASH stands for "Dietary Approaches to Stop Hypertension." The DASH eating plan is a healthy eating plan that has been shown to reduce high blood pressure (hypertension). It may also reduce your risk for type 2 diabetes, heart disease, and stroke. The DASH eating plan may also help with weight loss. What are tips for following this plan? General guidelines Avoid eating more than 2,300 mg (milligrams) of salt (sodium) a day. If you have hypertension, you may need to reduce your sodium intake to 1,500 mg a day. Limit alcohol intake to no more than 1 drink a day for nonpregnant women and 2 drinks a day for men. One drink equals 12 oz of beer, 5 oz of wine, or 1 oz of hard liquor. Work with your health care provider to maintain a healthy body weight or to lose weight. Ask what an ideal weight  is for you. Get at least 30 minutes of exercise that causes your heart to beat faster (aerobic exercise) most days of the week. Activities may include walking, swimming, or biking. Work with your health care provider or diet and nutrition specialist (dietitian) to adjust your eating plan to your individual calorie needs. Reading food labels Check food labels for the amount of sodium per serving. Choose foods with less than 5 percent of the Daily Value of sodium. Generally, foods with less than 300 mg of sodium per serving fit into this eating plan. To find whole grains, look for the word "whole" as the first word in the ingredient list. Shopping Buy products labeled as "low-sodium" or "no salt added." Buy fresh foods. Avoid canned foods and premade or frozen meals. Cooking Avoid adding salt when cooking. Use salt-free seasonings or herbs instead of table salt or sea salt. Check with your health care provider or pharmacist before using salt substitutes. Do not fry foods. Cook foods using healthy methods such as baking, boiling, grilling, and broiling instead. Cook with heart-healthy oils, such as olive, canola, soybean, or sunflower oil. Meal planning  Eat a balanced diet that includes: 5 or more servings of fruits and vegetables each day. At each meal, try to fill half of your plate with fruits and vegetables. Up to 6-8 servings of whole grains each day. Less than 6 oz of lean meat, poultry, or fish each day. A 3-oz serving of meat is about the same size as a deck of cards. One egg equals 1 oz. 2 servings of low-fat dairy each day. A serving of nuts, seeds, or beans 5 times each week. Heart-healthy fats. Healthy fats called Omega-3 fatty acids are found in foods such as flaxseeds and coldwater fish, like sardines, salmon, and mackerel. Limit how much you eat of the following: Canned or prepackaged foods. Food that is high in trans fat, such as fried foods. Food that is high in saturated fat,  such as fatty meat. Sweets, desserts, sugary drinks, and other foods with added sugar. Full-fat dairy products. Do not salt foods before eating. Try to eat at least 2 vegetarian meals each week. Eat more home-cooked food and less restaurant, buffet, and fast food. When eating at a restaurant, ask that your food be prepared with less salt or no salt, if possible. What foods are recommended? The items listed may not be a complete list. Talk with your dietitian about what dietary choices are best for you. Grains Whole-grain or whole-wheat bread. Whole-grain or whole-wheat pasta. Brown rice. [place]. Bulgur. Whole-grain and low-sodium cereals. Pita bread. Low-fat, low-sodium crackers. Whole-wheat flour  tortillas. Vegetables Fresh or frozen vegetables (raw, steamed, roasted, or grilled). Low-sodium or reduced-sodium tomato and vegetable juice. Low-sodium or reduced-sodium tomato sauce and tomato paste. Low-sodium or reduced-sodium canned vegetables. Fruits All fresh, dried, or frozen fruit. Canned fruit in natural juice (without added sugar). Meat and other protein foods Skinless chicken or [place]. Ground chicken or [place]. Pork with fat trimmed off. Fish and seafood. Egg whites. Dried beans, peas, or lentils. Unsalted nuts, nut butters, and seeds. Unsalted canned beans. Lean cuts of beef with fat trimmed off. Low-sodium, lean deli meat. Dairy Low-fat (1%) or fat-free (skim) milk. Fat-free, low-fat, or reduced-fat cheeses. Nonfat, low-sodium ricotta or cottage cheese. Low-fat or nonfat yogurt. Low-fat, low-sodium cheese. Fats and oils Soft margarine without trans fats. Vegetable oil. Low-fat, reduced-fat, or light mayonnaise and salad dressings (reduced-sodium). Canola, safflower, olive, soybean, and sunflower oils. Avocado. Seasoning and other foods Herbs. Spices. Seasoning mixes without salt. Unsalted popcorn and pretzels. Fat-free sweets. What foods are not recommended? The items listed  may not be a complete list. Talk with your dietitian about what dietary choices are best for you. Grains Baked goods made with fat, such as croissants, muffins, or some breads. Dry pasta or rice meal packs. Vegetables Creamed or fried vegetables. Vegetables in a cheese sauce. Regular canned vegetables (not low-sodium or reduced-sodium). Regular canned tomato sauce and paste (not low-sodium or reduced-sodium). Regular tomato and vegetable juice (not low-sodium or reduced-sodium). Angie Fava. Olives. Fruits Canned fruit in a light or heavy syrup. Fried fruit. Fruit in cream or butter sauce. Meat and other protein foods Fatty cuts of meat. Ribs. Fried meat. Berniece Salines. Sausage. Bologna and other processed lunch meats. Salami. Fatback. Hotdogs. Bratwurst. Salted nuts and seeds. Canned beans with added salt. Canned or smoked fish. Whole eggs or egg yolks. Chicken or [place] with skin. Dairy Whole or 2% milk, cream, and half-and-half. Whole or full-fat cream cheese. Whole-fat or sweetened yogurt. Full-fat cheese. Nondairy creamers. Whipped toppings. Processed cheese and cheese spreads. Fats and oils Butter. Stick margarine. Lard. Shortening. Ghee. Bacon fat. Tropical oils, such as coconut, palm kernel, or palm oil. Seasoning and other foods Salted popcorn and pretzels. Onion salt, garlic salt, seasoned salt, table salt, and sea salt. Worcestershire sauce. Tartar sauce. Barbecue sauce. Teriyaki sauce. Soy sauce, including reduced-sodium. Steak sauce. Canned and packaged gravies. Fish sauce. Oyster sauce. Cocktail sauce. Horseradish that you find on the shelf. Ketchup. Mustard. Meat flavorings and tenderizers. Bouillon cubes. Hot sauce and Tabasco sauce. Premade or packaged marinades. Premade or packaged taco seasonings. Relishes. Regular salad dressings. Where to find more information: National Heart, Lung, and [place]: https://wilson-eaton.com/ American Heart Association: www.heart.org Summary The DASH eating  plan is a healthy eating plan that has been shown to reduce high blood pressure (hypertension). It may also reduce your risk for type 2 diabetes, heart disease, and stroke. With the DASH eating plan, you should limit salt (sodium) intake to 2,300 mg a day. If you have hypertension, you may need to reduce your sodium intake to 1,500 mg a day. When on the DASH eating plan, aim to eat more fresh fruits and vegetables, whole grains, lean proteins, low-fat dairy, and heart-healthy fats. Work with your health care provider or diet and nutrition specialist (dietitian) to adjust your eating plan to your individual calorie needs. This information is not intended to replace advice given to you by your health care provider. Make sure you discuss any questions you have with your health care provider. Document Released: 01/25/2011 Document Revised: 01/30/2016  Document Reviewed: 01/30/2016 Elsevier Interactive Patient Education  [employer].

## 2017-03-27 NOTE — Progress Notes (Signed)
Subjective:  Patient ID: Anthony Vincent, male    DOB: 22-Dec-1970  Age: 47 y.o. MRN: [id number]  CC: Diabetes and Hypertension   HPI Anthony Vincent is a 47 y/o [place] American male with medical hx of type 2 DM, HTN, Erectile Dysfunction, and Dyslipidemia. He presents today for DM and HTN follow-up   HTN: Patient compliant with medication regimen. Compliant with a low-salt and low-carb diet. He does not check his blood pressure at home. Denies chest pain, sob, dyspnea on exertion, dizziness, palpitations, peripheral edema, weakness, or fatigue.   DM: Patient compliant with medication regimen. He attempts to maintain a low-sugar diet, but consistently drinks sweet tea with each meal. Report glucose ranges at home from 110's to 180's. Denies polyuria, polydipsia, or polyphagia. Denies hypoglycemic episodes at home. Denies paresthesia, loss of sensation in lower extremities, or wounds. Last seen by Podiatry in June of last year. Denies changes in vision. Last seen by Ophthalmology 4 months ago. No retinopathy present on last exam, per pt.   Past Medical History:  Diagnosis Date  . Diabetes mellitus without complication ([place])   . Hyperlipidemia   . Hypertension    Patient Active Problem List   Diagnosis Date Noted  . Type 2 diabetes mellitus without complication, with long-term current use of insulin ([place]) 03/27/2017  . Colon cancer screening 12/10/2016  . Erectile dysfunction 08/15/2016  . 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 Tinea corporis 09/10/2013  . Essential hypertension, benign 10/01/2012  . Diabetes ([place]) 10/01/2012  . Dyslipidemia 10/01/2012   No past surgical history on file. Social History   Socioeconomic History  . Marital status: Single    Spouse name: Not on file  . Number of children: Not on file  . Years of education: Not on file  . Highest education level: Not on file  Social Needs  . Financial resource strain: Not on file  . Food insecurity - worry: Not on file    . Food insecurity - inability: Not on file  . Transportation needs - medical: Not on file  . Transportation needs - non-medical: Not on file  Occupational History  . Not on file  Tobacco Use  . Smoking status: Never Smoker  . Smokeless tobacco: Never Used  Substance and Sexual Activity  . Alcohol use: No  . Drug use: No  . Sexual activity: Not on file  Other Topics Concern  . Not on file  Social History Narrative  . Not on file   Outpatient Medications Prior to Visit  Medication Sig Dispense Refill  . amLODipine (NORVASC) 10 MG tablet Take 1 tablet (10 mg total) by mouth daily. 90 tablet 3  . empagliflozin (JARDIANCE) 25 MG TABS tablet Take 25 mg by mouth daily. 30 tablet 3  . gabapentin (NEURONTIN) 100 MG capsule Take 1 capsule (100 mg total) by mouth 3 (three) times daily. 270 capsule 3  . glipiZIDE (GLIPIZIDE XL) 10 MG 24 hr tablet Take 1 tablet (10 mg total) by mouth daily with breakfast. 90 tablet 3  . lisinopril-hydrochlorothiazide (PRINZIDE,ZESTORETIC) 20-12.5 MG tablet Take 1 tablet by mouth daily. 90 tablet 3  . metFORMIN (GLUCOPHAGE) 1000 MG tablet Take 1 tablet (1,000 mg total) by mouth 2 (two) times daily with a meal. 180 tablet 3  . sildenafil (VIAGRA) 50 MG tablet Take 1 tablet (50 mg total) by mouth daily as needed for erectile dysfunction. 20 tablet 0  . simvastatin (ZOCOR) 40 MG tablet Take 1 tablet (40 mg total) by mouth daily at  6 PM. 90 tablet 3  . sitaGLIPtin (JANUVIA) 100 MG tablet Take 1 tablet (100 mg total) by mouth daily. 30 tablet 3   No facility-administered medications prior to visit.    No Known Allergies   ROS Review of Systems  Constitutional: Negative for activity change, appetite change, fatigue and unexpected weight change.  Eyes: Negative for visual disturbance.  Respiratory: Negative for cough, chest tightness, shortness of breath and wheezing.   Cardiovascular: Negative for chest pain, palpitations and leg swelling.  Gastrointestinal:  Negative for abdominal pain, constipation, diarrhea, nausea and vomiting.  Endocrine: Negative for polydipsia, polyphagia and polyuria.  Skin: Negative for color change, rash and wound.  Neurological: Negative for dizziness, tremors, syncope, weakness, light-headedness, numbness and headaches.  Psychiatric/Behavioral: Negative for confusion, decreased concentration, dysphoric mood and sleep disturbance.   Objective:  BP 129/84   Pulse 87   Temp 98.7 F (37.1 C) (Oral)   Resp 16   Ht 6' (1.829 m)   Wt 293 lb (132.9 kg)   SpO2 96%   BMI 39.74 kg/m   BP/Weight 03/27/2017 12/10/2016 1/95/0932  Systolic BP 671 245 809  Diastolic BP 84 75 77  Wt. (Lbs) 293 288 289.4  BMI 39.74 39.06 39.25   Lab Results  Component Value Date   HGBA1C 9.2 03/27/2017   Physical Exam  Constitutional: He is oriented to person, place, and time. He appears well-developed and well-nourished. No distress.  HENT:  Head: Normocephalic and atraumatic.  Eyes: EOM are normal.  Neck: Normal range of motion. No JVD present. Carotid bruit is not present.  Cardiovascular: Normal rate, regular rhythm and normal heart sounds. Exam reveals no gallop and no friction rub.  No murmur heard. Pulmonary/Chest: Effort normal and breath sounds normal. No respiratory distress. He exhibits no tenderness.  Abdominal: Soft. Bowel sounds are normal. There is no tenderness.  Musculoskeletal: Normal range of motion. He exhibits no edema or tenderness.  Neurological: He is alert and oriented to person, place, and time. He has normal reflexes. No cranial nerve deficit or sensory deficit. He exhibits normal muscle tone.  No deficit present on monofilament, bilaterally.   Skin: Skin is warm, dry and intact. No rash noted.  Psychiatric: He has a normal mood and affect. His behavior is normal. Judgment and thought content normal.  Nursing note and vitals reviewed.  Assessment & Plan:   1. Type 2 diabetes mellitus without complication,  with long-term current use of insulin ([place]), uncontrolled - In clinic A1c 9.2. Started on Trulicity.  - Instructed to maintain a low-carb, low-salt, and low-sugar diet.  - Instructed to maintain a 150 min/week exercise regimen.  - POCT glucose (manual entry) - POCT glycosylated hemoglobin (Hb A1C)  2. Essential hypertension, benign, controlled - Patient asymptomatic and BP in clinic 129/84. - Instructed to maintain a low-carb, low-salt, and low-sugar diet.  - Instructed to maintain a 150 min/week exercise regimen.  - Continue medication regimen.  - Follow-up in 3 months.   3. Dyslipidemia, controlled - Continue medication regimen.  - Follow-up in 3 months. Repeat labs at next visit.   No orders of the defined types were placed in this encounter.   Follow-up: Return in about 3 months (around 06/24/2017) for Hemoglobin A1C and Follow up, DM, Follow up HTN.   Krystle H. Hulan Fray, AGDNP-student  Evaluation and management procedures were performed by me with DNP Student in attendance, note written by DNP student under my supervision and collaboration. I have reviewed the note and I  agree with the management and plan.   Angelica Chessman, MD, MHA, [place], Karilyn Cota [facility] and [place], [place]   03/28/2017, 3:48 PM

## 2017-06-26 ENCOUNTER — Ambulatory Visit: Payer: BC Managed Care – PPO | Attending: Family Medicine | Admitting: Family Medicine

## 2017-06-26 ENCOUNTER — Encounter: Payer: Self-pay | Admitting: Family Medicine

## 2017-06-26 VITALS — BP 138/85 | HR 100 | Temp 98.8°F | Ht 72.0 in | Wt 296.4 lb

## 2017-06-26 DIAGNOSIS — Z79899 Other long term (current) drug therapy: Secondary | ICD-10-CM | POA: Insufficient documentation

## 2017-06-26 DIAGNOSIS — E119 Type 2 diabetes mellitus without complications: Secondary | ICD-10-CM

## 2017-06-26 DIAGNOSIS — Z794 Long term (current) use of insulin: Secondary | ICD-10-CM | POA: Insufficient documentation

## 2017-06-26 DIAGNOSIS — E785 Hyperlipidemia, unspecified: Secondary | ICD-10-CM

## 2017-06-26 DIAGNOSIS — B353 Tinea pedis: Secondary | ICD-10-CM | POA: Diagnosis not present

## 2017-06-26 DIAGNOSIS — I10 Essential (primary) hypertension: Secondary | ICD-10-CM

## 2017-06-26 LAB — POCT GLYCOSYLATED HEMOGLOBIN (HGB A1C): Hemoglobin A1C: 7.8

## 2017-06-26 LAB — GLUCOSE, POCT (MANUAL RESULT ENTRY): POC Glucose: 138 mg/dl — AB (ref 70–99)

## 2017-06-26 MED ORDER — TERBINAFINE HCL 1 % EX CREA: 1.0000 "application " | TOPICAL_CREAM | Freq: Two times a day (BID) | CUTANEOUS | 1 refills | Status: DC

## 2017-06-26 MED ORDER — DULAGLUTIDE 0.75 MG/0.5ML ~~LOC~~ SOAJ
1.5000 mg | SUBCUTANEOUS | 3 refills | Status: DC
Start: 2017-06-26 — End: 2017-10-01

## 2017-06-26 NOTE — Patient Instructions (Signed)
Athlete's Foot Athlete's foot (tinea pedis) is a fungal infection of the skin on the feet. It often occurs on the skin that is between or underneath the toes. It can also occur on the soles of the feet. The infection can spread from person to person (is contagious). Follow these instructions at home:  Apply or take over-the-counter and prescription medicines only as told by your doctor.  Keep all follow-up visits as told by your doctor. This is important.  Do not scratch your feet.  Keep your feet dry: ? Wear cotton or wool socks. Change your socks every day or if they become wet. ? Wear shoes that allow air to move around, such as sandals or canvas tennis shoes.  Wash and dry your feet: ? Every day or as told by your doctor. ? After exercising. ? Including the area between your toes.  Wear sandals in wet areas, such as locker rooms and shared showers.  Do not share any of these items: ? Towels. ? Nail clippers. ? Other personal items that touch your feet.  If you have diabetes, keep your blood sugar under control. Contact a doctor if:  You have a fever.  You have swelling, soreness, warmth, or redness in your foot.  You are not getting better with treatment.  Your symptoms get worse.  You have new symptoms. This information is not intended to replace advice given to you by your health care provider. Make sure you discuss any questions you have with your health care provider. Document Released: 07/25/2007 Document Revised: 07/14/2015 Document Reviewed: 08/09/2014 Elsevier Interactive Patient Education  2018 Elsevier Inc.  

## 2017-06-26 NOTE — Progress Notes (Signed)
Subjective:  Patient ID: Anthony Vincent, male    DOB: [birth year]  Age: 47 y.o. MRN: [id number]  CC: Diabetes   HPI Anthony Vincent is a 47 year old male with a history of type 2 diabetes mellitus (A1c 7.8), hypertension, hyperlipidemia who presents today to establish care with me as he was previously followed by Dr. Doreene Burke. His A1c is 7.8 and has improved from 9.2 previously and he endorses doing well on Trulicity and his other medications.  He complains he takes too many medications and would like to cut down on the number of medications. Tolerates his antihypertensive and statin with no complaints of adverse effects.  He is compliant with a diabetic diet and low-cholesterol diet as well as low sodium but does not exercise regularly. He complains the balls of his feet "feel like he is stepping on something" . He is scheduled to see podiatry next week. He has no other concerns today   Past Medical History:  Diagnosis Date  . Diabetes mellitus without complication ([place])   . Hyperlipidemia   . Hypertension     No past surgical history on file.  No Known Allergies   Outpatient Medications Prior to Visit  Medication Sig Dispense Refill  . amLODipine (NORVASC) 10 MG tablet Take 1 tablet (10 mg total) by mouth daily. 90 tablet 3  . empagliflozin (JARDIANCE) 25 MG TABS tablet Take 25 mg by mouth daily. 30 tablet 3  . gabapentin (NEURONTIN) 100 MG capsule Take 1 capsule (100 mg total) by mouth 3 (three) times daily. 270 capsule 3  . glipiZIDE (GLIPIZIDE XL) 10 MG 24 hr tablet Take 1 tablet (10 mg total) by mouth daily with breakfast. 90 tablet 3  . lisinopril-hydrochlorothiazide (PRINZIDE,ZESTORETIC) 20-12.5 MG tablet Take 1 tablet by mouth daily. 90 tablet 3  . metFORMIN (GLUCOPHAGE) 1000 MG tablet Take 1 tablet (1,000 mg total) by mouth 2 (two) times daily with a meal. 180 tablet 3  . sildenafil (VIAGRA) 50 MG tablet Take 1 tablet (50 mg total) by mouth daily as needed for erectile  dysfunction. 20 tablet 1  . simvastatin (ZOCOR) 40 MG tablet Take 1 tablet (40 mg total) by mouth daily at 6 PM. 90 tablet 3  . Dulaglutide (TRULICITY) 7.67 HA/1.9FX SOPN Inject 0.75 mg into the skin once a week. 5 mL 3  . sitaGLIPtin (JANUVIA) 100 MG tablet Take 1 tablet (100 mg total) by mouth daily. 30 tablet 3   No facility-administered medications prior to visit.     ROS Review of Systems  Constitutional: Negative for activity change and appetite change.  HENT: Negative for sinus pressure and sore throat.   Eyes: Negative for visual disturbance.  Respiratory: Negative for cough, chest tightness and shortness of breath.   Cardiovascular: Negative for chest pain and leg swelling.  Gastrointestinal: Negative for abdominal distention, abdominal pain, constipation and diarrhea.  Endocrine: Negative.   Genitourinary: Negative for dysuria.  Musculoskeletal:       See hpi  Skin: Negative for rash.  Allergic/Immunologic: Negative.   Neurological: Negative for weakness, light-headedness and numbness.  Psychiatric/Behavioral: Negative for dysphoric mood and suicidal ideas.     Objective:  BP 138/85   Pulse 100   Temp 98.8 F (37.1 C) (Oral)   Ht 6' (1.829 m)   Wt 296 lb 6.4 oz (134.4 kg)   SpO2 96%   BMI 40.20 kg/m   BP/Weight 06/26/2017 03/27/2017 90/24/0973  Systolic BP 532 992 426  Diastolic BP 85 84 75  Wt. (Lbs)  296.4 293 288  BMI 40.2 39.74 39.06      Physical Exam  Constitutional: He is oriented to person, place, and time. He appears well-developed and well-nourished.  Cardiovascular: Normal rate, normal heart sounds and intact distal pulses.  No murmur heard. Pulmonary/Chest: Effort normal and breath sounds normal. He has no wheezes. He has no rales. He exhibits no tenderness.  Abdominal: Soft. Bowel sounds are normal. He exhibits no distension and no mass. There is no tenderness.  Musculoskeletal: Normal range of motion.  Neurological: He is alert and oriented to  person, place, and time.  Skin: Skin is warm and dry.  Tinea pedis in webspaces of both feet  Psychiatric: He has a normal mood and affect.    CMP Latest Ref Rng & Units 08/15/2016 12/22/2015 12/02/2014  Glucose 65 - 99 mg/dL 245(H) 134(H) 343(H)  BUN 6 - 24 mg/dL 10 11 12   Creatinine 0.76 - 1.27 mg/dL 0.96 1.12 1.01  Sodium 134 - 144 mmol/L 140 140 136  Potassium 3.5 - 5.2 mmol/L 4.7 4.6 4.6  Chloride 96 - 106 mmol/L 100 103 98  CO2 20 - 29 mmol/L 22 26 24   Calcium 8.7 - 10.2 mg/dL 9.7 9.6 9.4  Total Protein 6.0 - 8.5 g/dL 7.5 7.5 7.8  Total Bilirubin 0.0 - 1.2 mg/dL 0.8 1.0 1.1  Alkaline Phos 39 - 117 IU/L 72 58 80  AST 0 - 40 IU/L 18 17 26   ALT 0 - 44 IU/L 37 29 48(H)    Lipid Panel     Component Value Date/Time   CHOL 141 08/15/2016 1147   TRIG 77 08/15/2016 1147   HDL 28 (L) 08/15/2016 1147   CHOLHDL 5.0 08/15/2016 1147   CHOLHDL 4.3 12/22/2015 1207   VLDL 11 12/22/2015 1207   LDLCALC 98 08/15/2016 1147    Lab Results  Component Value Date   HGBA1C 7.8 06/26/2017    Assessment & Plan:   1. Type 2 diabetes mellitus without complication, with long-term current use of insulin ([facility]) A1c of 7.8 which has improved significantly from 9.2 previously Commended on improvement Continue Januvia and increased dose of Trulicity as he complains of a number of pills he is taking Diabetic diet, lifestyle modifications  - POCT glucose (manual entry) - POCT glycosylated hemoglobin (Hb A1C) - CMP14+EGFR; Future - Lipid panel; Future - Microalbumin/Creatinine Ratio, Urine; Future  2. Essential hypertension, benign controlled Continue antihypertensives Counseled on blood pressure goal of less than 130/80, low-sodium, DASH diet, medication compliance, 150 minutes of moderate intensity exercise per week. Discussed medication compliance, adverse effects.   3. Dyslipidemia Controlled Continue statin  4. Tinea pedis of both feet Placed on Lamisil   Meds ordered this  encounter  Medications  . Dulaglutide (TRULICITY) 6.26 RS/8.5IO SOPN    Sig: Inject 1.5 mg into the skin once a week.    Dispense:  5 mL    Refill:  3    Discontinue Januvia  . terbinafine (LAMISIL AT) 1 % cream    Sig: Apply 1 application topically 2 (two) times daily.    Dispense:  30 g    Refill:  1    Follow-up: Return in about 3 months (around 09/26/2017) for Follow-up of chronic medical conditions.   [name] MD

## 2017-06-27 ENCOUNTER — Encounter: Payer: Self-pay | Admitting: Family Medicine

## 2017-07-10 ENCOUNTER — Ambulatory Visit: Payer: BC Managed Care – PPO | Attending: Family Medicine

## 2017-07-10 DIAGNOSIS — E119 Type 2 diabetes mellitus without complications: Secondary | ICD-10-CM | POA: Diagnosis present

## 2017-07-10 DIAGNOSIS — Z794 Long term (current) use of insulin: Secondary | ICD-10-CM

## 2017-07-10 NOTE — Progress Notes (Signed)
Patient here for lab visit only 

## 2017-07-11 LAB — CMP14+EGFR
ALBUMIN: 4.5 g/dL (ref 3.5–5.5)
ALT: 34 IU/L (ref 0–44)
AST: 15 IU/L (ref 0–40)
Albumin/Globulin Ratio: 1.4 (ref 1.2–2.2)
Alkaline Phosphatase: 78 IU/L (ref 39–117)
BILIRUBIN TOTAL: 0.4 mg/dL (ref 0.0–1.2)
BUN / CREAT RATIO: 9 (ref 9–20)
BUN: 11 mg/dL (ref 6–24)
CHLORIDE: 101 mmol/L (ref 96–106)
CO2: 21 mmol/L (ref 20–29)
Calcium: 9.8 mg/dL (ref 8.7–10.2)
Creatinine, Ser: 1.25 mg/dL (ref 0.76–1.27)
GFR calc Af Amer: 78 mL/min/{1.73_m2} (ref >59)
GFR calc non Af Amer: 68 mL/min/{1.73_m2} (ref >59)
GLOBULIN, TOTAL: 3.2 g/dL (ref 1.5–4.5)
Glucose: 196 mg/dL — ABNORMAL HIGH (ref 65–99)
POTASSIUM: 4.3 mmol/L (ref 3.5–5.2)
SODIUM: 142 mmol/L (ref 134–144)
Total Protein: 7.7 g/dL (ref 6.0–8.5)

## 2017-07-11 LAB — LIPID PANEL
CHOLESTEROL TOTAL: 126 mg/dL (ref 100–199)
Chol/HDL Ratio: 4.3 ratio (ref 0.0–5.0)
HDL: 29 mg/dL — ABNORMAL LOW (ref >39)
LDL Calculated: 77 mg/dL (ref 0–99)
Triglycerides: 102 mg/dL (ref 0–149)
VLDL Cholesterol Cal: 20 mg/dL (ref 5–40)

## 2017-07-11 LAB — MICROALBUMIN / CREATININE URINE RATIO
CREATININE, UR: 96.7 mg/dL
MICROALB/CREAT RATIO: 5.2 mg/g{creat} (ref 0.0–30.0)
Microalbumin, Urine: 5 ug/mL

## 2017-07-18 ENCOUNTER — Telehealth: Payer: Self-pay

## 2017-07-18 NOTE — Telephone Encounter (Signed)
Patient was called and informed of lab results. 

## 2017-08-30 ENCOUNTER — Encounter (HOSPITAL_COMMUNITY): Payer: Self-pay | Admitting: Emergency Medicine

## 2017-08-30 ENCOUNTER — Ambulatory Visit (HOSPITAL_COMMUNITY)
Admission: EM | Admit: 2017-08-30 | Discharge: 2017-08-30 | Disposition: A | Discharge status: Home / Self Care | Payer: BC Managed Care – PPO | Attending: Internal Medicine | Admitting: Internal Medicine

## 2017-08-30 DIAGNOSIS — M7918 Myalgia, other site: Secondary | ICD-10-CM

## 2017-08-30 DIAGNOSIS — R51 Headache: Secondary | ICD-10-CM | POA: Diagnosis not present

## 2017-08-30 DIAGNOSIS — M791 Myalgia, unspecified site: Secondary | ICD-10-CM

## 2017-08-30 NOTE — Discharge Instructions (Signed)
Use anti-inflammatories for pain/swelling. You may take up to 800 mg Ibuprofen every 8 hours with food. You may supplement Ibuprofen with Tylenol [phone] mg every 8 hours OR aleeve as alternative  May use ice to help with any swelling/inflammation Heat to help loosen up any tight muscles  I expect gradual improvement over next 1-2 weeks  Follow up if symptoms worsening, developing numbness, tingling, loss of bowel/bladder control, lightheadedness, dizziness, severe headache, vision changes, weakness
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
## 2017-09-28 ENCOUNTER — Other Ambulatory Visit: Payer: Self-pay | Admitting: Internal Medicine

## 2017-09-28 DIAGNOSIS — E0800 Diabetes mellitus due to underlying condition with hyperosmolarity without nonketotic hyperglycemic-hyperosmolar coma (NKHHC): Secondary | ICD-10-CM

## 2017-10-01 ENCOUNTER — Encounter: Payer: Self-pay | Admitting: Family Medicine

## 2017-10-01 ENCOUNTER — Ambulatory Visit: Payer: BC Managed Care – PPO | Attending: Family Medicine | Admitting: Family Medicine

## 2017-10-01 VITALS — BP 118/82 | HR 89 | Temp 98.5°F | Ht 72.0 in | Wt 290.8 lb

## 2017-10-01 DIAGNOSIS — E0800 Diabetes mellitus due to underlying condition with hyperosmolarity without nonketotic hyperglycemic-hyperosmolar coma (NKHHC): Secondary | ICD-10-CM

## 2017-10-01 DIAGNOSIS — E119 Type 2 diabetes mellitus without complications: Secondary | ICD-10-CM

## 2017-10-01 DIAGNOSIS — Z79899 Other long term (current) drug therapy: Secondary | ICD-10-CM | POA: Diagnosis not present

## 2017-10-01 DIAGNOSIS — M79651 Pain in right thigh: Secondary | ICD-10-CM

## 2017-10-01 DIAGNOSIS — E785 Hyperlipidemia, unspecified: Secondary | ICD-10-CM | POA: Insufficient documentation

## 2017-10-01 DIAGNOSIS — E1100 Type 2 diabetes mellitus with hyperosmolarity without nonketotic hyperglycemic-hyperosmolar coma (NKHHC): Secondary | ICD-10-CM | POA: Insufficient documentation

## 2017-10-01 DIAGNOSIS — Z7984 Long term (current) use of oral hypoglycemic drugs: Secondary | ICD-10-CM | POA: Insufficient documentation

## 2017-10-01 DIAGNOSIS — Z794 Long term (current) use of insulin: Secondary | ICD-10-CM | POA: Insufficient documentation

## 2017-10-01 DIAGNOSIS — E7800 Pure hypercholesterolemia, unspecified: Secondary | ICD-10-CM | POA: Diagnosis not present

## 2017-10-01 DIAGNOSIS — I10 Essential (primary) hypertension: Secondary | ICD-10-CM | POA: Diagnosis not present

## 2017-10-01 DIAGNOSIS — E1140 Type 2 diabetes mellitus with diabetic neuropathy, unspecified: Secondary | ICD-10-CM | POA: Insufficient documentation

## 2017-10-01 DIAGNOSIS — M545 Low back pain, unspecified: Secondary | ICD-10-CM

## 2017-10-01 LAB — POCT GLYCOSYLATED HEMOGLOBIN (HGB A1C): HbA1c, POC (controlled diabetic range): 8.4 % — AB (ref 0.0–7.0)

## 2017-10-01 LAB — GLUCOSE, POCT (MANUAL RESULT ENTRY): POC GLUCOSE: 161 mg/dL — AB (ref 70–99)

## 2017-10-01 MED ORDER — METFORMIN HCL 1000 MG PO TABS: 1000.0000 mg | ORAL_TABLET | Freq: Two times a day (BID) | ORAL | 1 refills | Status: DC

## 2017-10-01 MED ORDER — METHOCARBAMOL 500 MG PO TABS: 500.0000 mg | ORAL_TABLET | Freq: Three times a day (TID) | ORAL | 1 refills | Status: DC | PRN

## 2017-10-01 MED ORDER — GABAPENTIN 300 MG PO CAPS: 300.0000 mg | ORAL_CAPSULE | Freq: Three times a day (TID) | ORAL | 1 refills | Status: DC

## 2017-10-01 MED ORDER — LISINOPRIL-HYDROCHLOROTHIAZIDE 20-12.5 MG PO TABS: 1.0000 | ORAL_TABLET | Freq: Every day | ORAL | 1 refills | Status: DC

## 2017-10-01 MED ORDER — AMLODIPINE BESYLATE 10 MG PO TABS: 10.0000 mg | ORAL_TABLET | Freq: Every day | ORAL | 1 refills | Status: DC

## 2017-10-01 MED ORDER — EMPAGLIFLOZIN 25 MG PO TABS: 25.0000 mg | ORAL_TABLET | Freq: Every day | ORAL | 1 refills | Status: DC

## 2017-10-01 MED ORDER — DULAGLUTIDE 0.75 MG/0.5ML ~~LOC~~ SOAJ
1.5000 mg | SUBCUTANEOUS | 3 refills | Status: DC
Start: 2017-10-01 — End: 2018-04-03

## 2017-10-01 MED ORDER — GLIPIZIDE ER 10 MG PO TB24: 10.0000 mg | ORAL_TABLET | Freq: Every day | ORAL | 1 refills | Status: DC

## 2017-10-01 MED ORDER — ATORVASTATIN CALCIUM 40 MG PO TABS: 40.0000 mg | ORAL_TABLET | Freq: Every day | ORAL | 1 refills | Status: DC

## 2017-10-01 NOTE — Progress Notes (Signed)
Patient is having pain in lower back in the mornings.  Patient states that he is having some pain in right leg.

## 2017-10-01 NOTE — Progress Notes (Signed)
Subjective:  Patient ID: Anthony Vincent, male    DOB: 1970-04-15  Age: 47 y.o. MRN: [id number]  CC: Diabetes   HPI Anthony Vincent is a 47 year old male with a history of type 2 diabetes mellitus (A1c 8.4), hypertension, hyperlipidemia who presents today. His A1c is 8.4 which has trended up from 7.8 previously and he endorses being out of his Jardiance for the last 1 week but has been compliant with all his other medications.  Denies visual concerns or hypoglycemia but complains of right thigh pain which he describes as cold sensation in his thigh rated as a 3-4/10 and is annoying.  Symptoms are further described as feeling like his pants are wet.  He currently takes gabapentin for diabetic neuropathy. He has also had low back pain for the last 2 weeks every time he wakes up but pain is absent at this time and it improves as the day progresses.  Denies radiation of pain down his lower extremities. He has been compliant with his antihypertensive and taking his statin with no complains of adverse effects. Denies chest pains, shortness of breath.  Past Medical History:  Diagnosis Date  . Diabetes mellitus without complication ([place])   . Hyperlipidemia   . Hypertension     History reviewed. No pertinent surgical history.  No Known Allergies   Outpatient Medications Prior to Visit  Medication Sig Dispense Refill  . sildenafil (VIAGRA) 50 MG tablet Take 1 tablet (50 mg total) by mouth daily as needed for erectile dysfunction. 20 tablet 1  . terbinafine (LAMISIL AT) 1 % cream Apply 1 application topically 2 (two) times daily. 30 g 1  . amLODipine (NORVASC) 10 MG tablet Take 1 tablet (10 mg total) by mouth daily. 90 tablet 3  . Dulaglutide (TRULICITY) 4.09 WJ/1.9JY SOPN Inject 1.5 mg into the skin once a week. 5 mL 3  . gabapentin (NEURONTIN) 100 MG capsule Take 1 capsule (100 mg total) by mouth 3 (three) times daily. 270 capsule 3  . glipiZIDE (GLIPIZIDE XL) 10 MG 24 hr tablet Take 1 tablet  (10 mg total) by mouth daily with breakfast. 90 tablet 3  . JARDIANCE 25 MG TABS tablet TAKE ONE TABLET BY MOUTH DAILY 30 tablet 0  . lisinopril-hydrochlorothiazide (PRINZIDE,ZESTORETIC) 20-12.5 MG tablet Take 1 tablet by mouth daily. 90 tablet 3  . metFORMIN (GLUCOPHAGE) 1000 MG tablet Take 1 tablet (1,000 mg total) by mouth 2 (two) times daily with a meal. 180 tablet 3  . simvastatin (ZOCOR) 40 MG tablet Take 1 tablet (40 mg total) by mouth daily at 6 PM. 90 tablet 3   No facility-administered medications prior to visit.     ROS Review of Systems  Constitutional: Negative for activity change and appetite change.  HENT: Negative for sinus pressure and sore throat.   Eyes: Negative for visual disturbance.  Respiratory: Negative for cough, chest tightness and shortness of breath.   Cardiovascular: Negative for chest pain and leg swelling.  Gastrointestinal: Negative for abdominal distention, abdominal pain, constipation and diarrhea.  Endocrine: Negative.   Genitourinary: Negative for dysuria.  Musculoskeletal: Negative for joint swelling and myalgias.  Skin: Negative for rash.  Allergic/Immunologic: Negative.   Neurological: Negative for weakness, light-headedness and numbness.  Psychiatric/Behavioral: Negative for dysphoric mood and suicidal ideas.    Objective:  BP 118/82   Pulse 89   Temp 98.5 F (36.9 C) (Oral)   Ht 6' (1.829 m)   Wt 290 lb 12.8 oz (131.9 kg)   SpO2 99%  BMI 39.44 kg/m   BP/Weight 10/01/2017 9/89/2119 05/21/7406  Systolic BP 144 818 563  Diastolic BP 82 94 85  Wt. (Lbs) 290.8 - 296.4  BMI 39.44 - 40.2      Physical Exam  Constitutional: He is oriented to person, place, and time. He appears well-developed and well-nourished.  Cardiovascular: Normal rate, normal heart sounds and intact distal pulses.  No murmur heard. Pulmonary/Chest: Effort normal and breath sounds normal. He has no wheezes. He has no rales. He exhibits no tenderness.  Abdominal:  Soft. Bowel sounds are normal. He exhibits no distension and no mass. There is no tenderness.  Musculoskeletal: Normal range of motion. He exhibits no tenderness.  No lumbar spine tenderness; negative straight leg raise bilaterally  Neurological: He is alert and oriented to person, place, and time.  No thigh tenderness bilaterally Normal sensation in thighs  Skin: Skin is warm and dry.  Psychiatric: He has a normal mood and affect.     CMP Latest Ref Rng & Units 07/10/2017 08/15/2016 12/22/2015  Glucose 65 - 99 mg/dL 196(H) 245(H) 134(H)  BUN 6 - 24 mg/dL 11 10 11   Creatinine 0.76 - 1.27 mg/dL 1.25 0.96 1.12  Sodium 134 - 144 mmol/L 142 140 140  Potassium 3.5 - 5.2 mmol/L 4.3 4.7 4.6  Chloride 96 - 106 mmol/L 101 100 103  CO2 20 - 29 mmol/L 21 22 26   Calcium 8.7 - 10.2 mg/dL 9.8 9.7 9.6  Total Protein 6.0 - 8.5 g/dL 7.7 7.5 7.5  Total Bilirubin 0.0 - 1.2 mg/dL 0.4 0.8 1.0  Alkaline Phos 39 - 117 IU/L 78 72 58  AST 0 - 40 IU/L 15 18 17   ALT 0 - 44 IU/L 34 37 29    Lipid Panel     Component Value Date/Time   CHOL 126 07/10/2017 0851   TRIG 102 07/10/2017 0851   HDL 29 (L) 07/10/2017 0851   CHOLHDL 4.3 07/10/2017 0851   CHOLHDL 4.3 12/22/2015 1207   VLDL 11 12/22/2015 1207   LDLCALC 77 07/10/2017 0851    Lab Results  Component Value Date   HGBA1C 8.4 (A) 10/01/2017    Assessment & Plan:   1. Type 2 diabetes mellitus without complication, with long-term current use of insulin (HCC) Uncontrolled with A1c of 8.4 which has trended up from 7.8 previously No regimen change at this time but he has been encouraged to adhere to a diabetic diet and lifestyle modifications; he has also been out of his Jardiance for 1 week Counseled on Diabetic diet, my plate method, 149 minutes of moderate intensity exercise/week Keep blood sugar logs with fasting goals of 80-120 mg/dl, random of less than 180 and in the event of sugars less than 60 mg/dl or greater than 400 mg/dl please notify the  clinic ASAP. It is recommended that you undergo annual eye exams and annual foot exams. Pneumonia vaccine is recommended. - POCT glucose (manual entry) - POCT glycosylated hemoglobin (Hb A1C) - metFORMIN (GLUCOPHAGE) 1000 MG tablet; Take 1 tablet (1,000 mg total) by mouth 2 (two) times daily with a meal.  Dispense: 180 tablet; Refill: 1 - empagliflozin (JARDIANCE) 25 MG TABS tablet; Take 25 mg by mouth daily.  Dispense: 90 tablet; Refill: 1 - glipiZIDE (GLIPIZIDE XL) 10 MG 24 hr tablet; Take 1 tablet (10 mg total) by mouth daily with breakfast.  Dispense: 90 tablet; Refill: 1 - gabapentin (NEURONTIN) 300 MG capsule; Take 1 capsule (300 mg total) by mouth 3 (three) times daily.  Dispense: 90 capsule; Refill: 1 - Dulaglutide (TRULICITY) 6.06 TK/1.6WF SOPN; Inject 1.5 mg into the skin once a week.  Dispense: 5 mL; Refill: 3  2. Diabetes mellitus due to underlying condition with hyperosmolarity without coma, without long-term current use of insulin ([place])   3. Essential hypertension, benign Controlled Counseled on blood pressure goal of less than 130/80, low-sodium, DASH diet, medication compliance, 150 minutes of moderate intensity exercise per week. Discussed medication compliance, adverse effects. - lisinopril-hydrochlorothiazide (PRINZIDE,ZESTORETIC) 20-12.5 MG tablet; Take 1 tablet by mouth daily.  Dispense: 90 tablet; Refill: 1 - amLODipine (NORVASC) 10 MG tablet; Take 1 tablet (10 mg total) by mouth daily.  Dispense: 90 tablet; Refill: 1  4. Pure hypercholesterolemia Controlled Low-cholesterol diet - atorvastatin (LIPITOR) 40 MG tablet; Take 1 tablet (40 mg total) by mouth daily.  Dispense: 90 tablet; Refill: 1  5. Acute bilateral low back pain without sciatica Absent at this time Advised to apply heat - methocarbamol (ROBAXIN) 500 MG tablet; Take 1 tablet (500 mg total) by mouth every 8 (eight) hours as needed for muscle spasms.  Dispense: 90 tablet; Refill: 1  6. Pain of right  thigh Possibly underlying neuropathy Increased dose of gabapentin   Meds ordered this encounter  Medications  . atorvastatin (LIPITOR) 40 MG tablet    Sig: Take 1 tablet (40 mg total) by mouth daily.    Dispense:  90 tablet    Refill:  1    Discontinue simvastatin  . metFORMIN (GLUCOPHAGE) 1000 MG tablet    Sig: Take 1 tablet (1,000 mg total) by mouth 2 (two) times daily with a meal.    Dispense:  180 tablet    Refill:  1  . lisinopril-hydrochlorothiazide (PRINZIDE,ZESTORETIC) 20-12.5 MG tablet    Sig: Take 1 tablet by mouth daily.    Dispense:  90 tablet    Refill:  1  . empagliflozin (JARDIANCE) 25 MG TABS tablet    Sig: Take 25 mg by mouth daily.    Dispense:  90 tablet    Refill:  1  . glipiZIDE (GLIPIZIDE XL) 10 MG 24 hr tablet    Sig: Take 1 tablet (10 mg total) by mouth daily with breakfast.    Dispense:  90 tablet    Refill:  1  . gabapentin (NEURONTIN) 300 MG capsule    Sig: Take 1 capsule (300 mg total) by mouth 3 (three) times daily.    Dispense:  90 capsule    Refill:  1  . amLODipine (NORVASC) 10 MG tablet    Sig: Take 1 tablet (10 mg total) by mouth daily.    Dispense:  90 tablet    Refill:  1  . Dulaglutide (TRULICITY) 0.93 AT/5.5DD SOPN    Sig: Inject 1.5 mg into the skin once a week.    Dispense:  5 mL    Refill:  3  . methocarbamol (ROBAXIN) 500 MG tablet    Sig: Take 1 tablet (500 mg total) by mouth every 8 (eight) hours as needed for muscle spasms.    Dispense:  90 tablet    Refill:  1    Follow-up: Return in about 3 months (around 01/01/2018) for Follow-up of chronic medical conditions.   [name] MD

## 2017-10-01 NOTE — Patient Instructions (Signed)

## 2017-11-18 IMAGING — DX DG HAND COMPLETE 3+V*L*
3 series · 3 of 3 positions shown · non-contrast
Comparison: None.

CLINICAL DATA: Left hand pain after injury today.

EXAM:
LEFT HAND - COMPLETE 3+ VIEW

[hand pa]
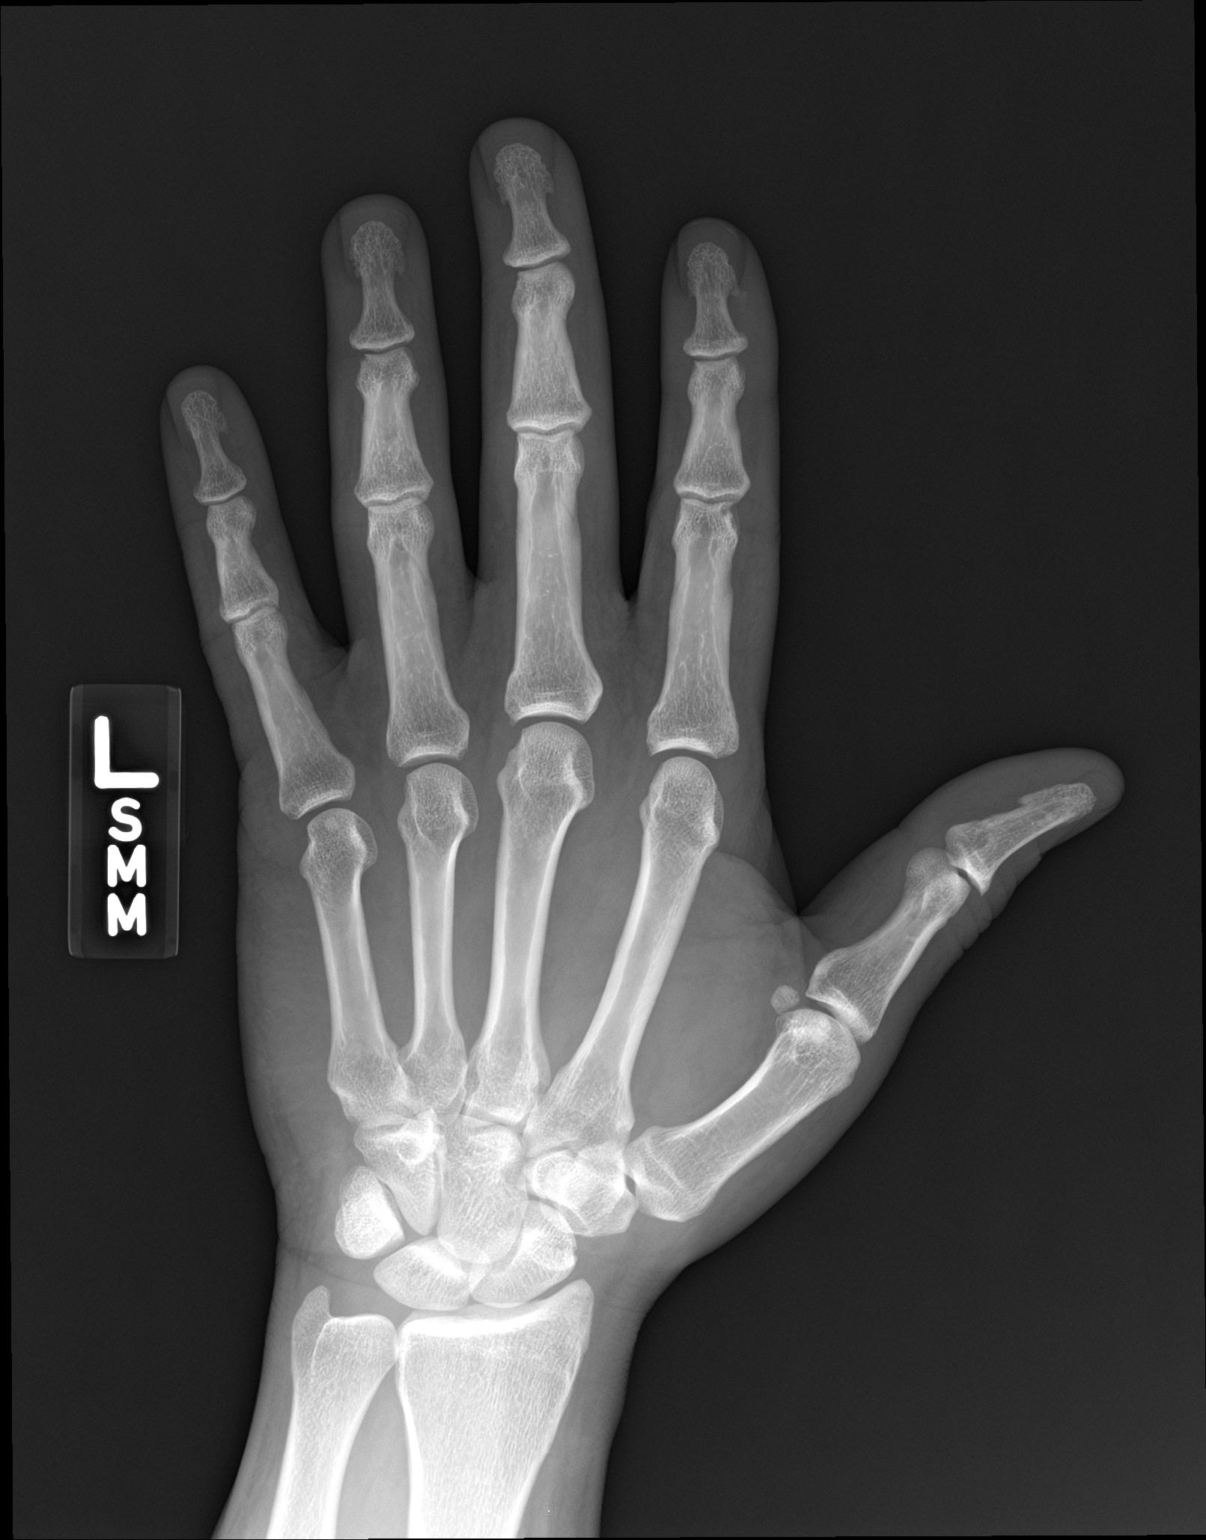

[hand obl]
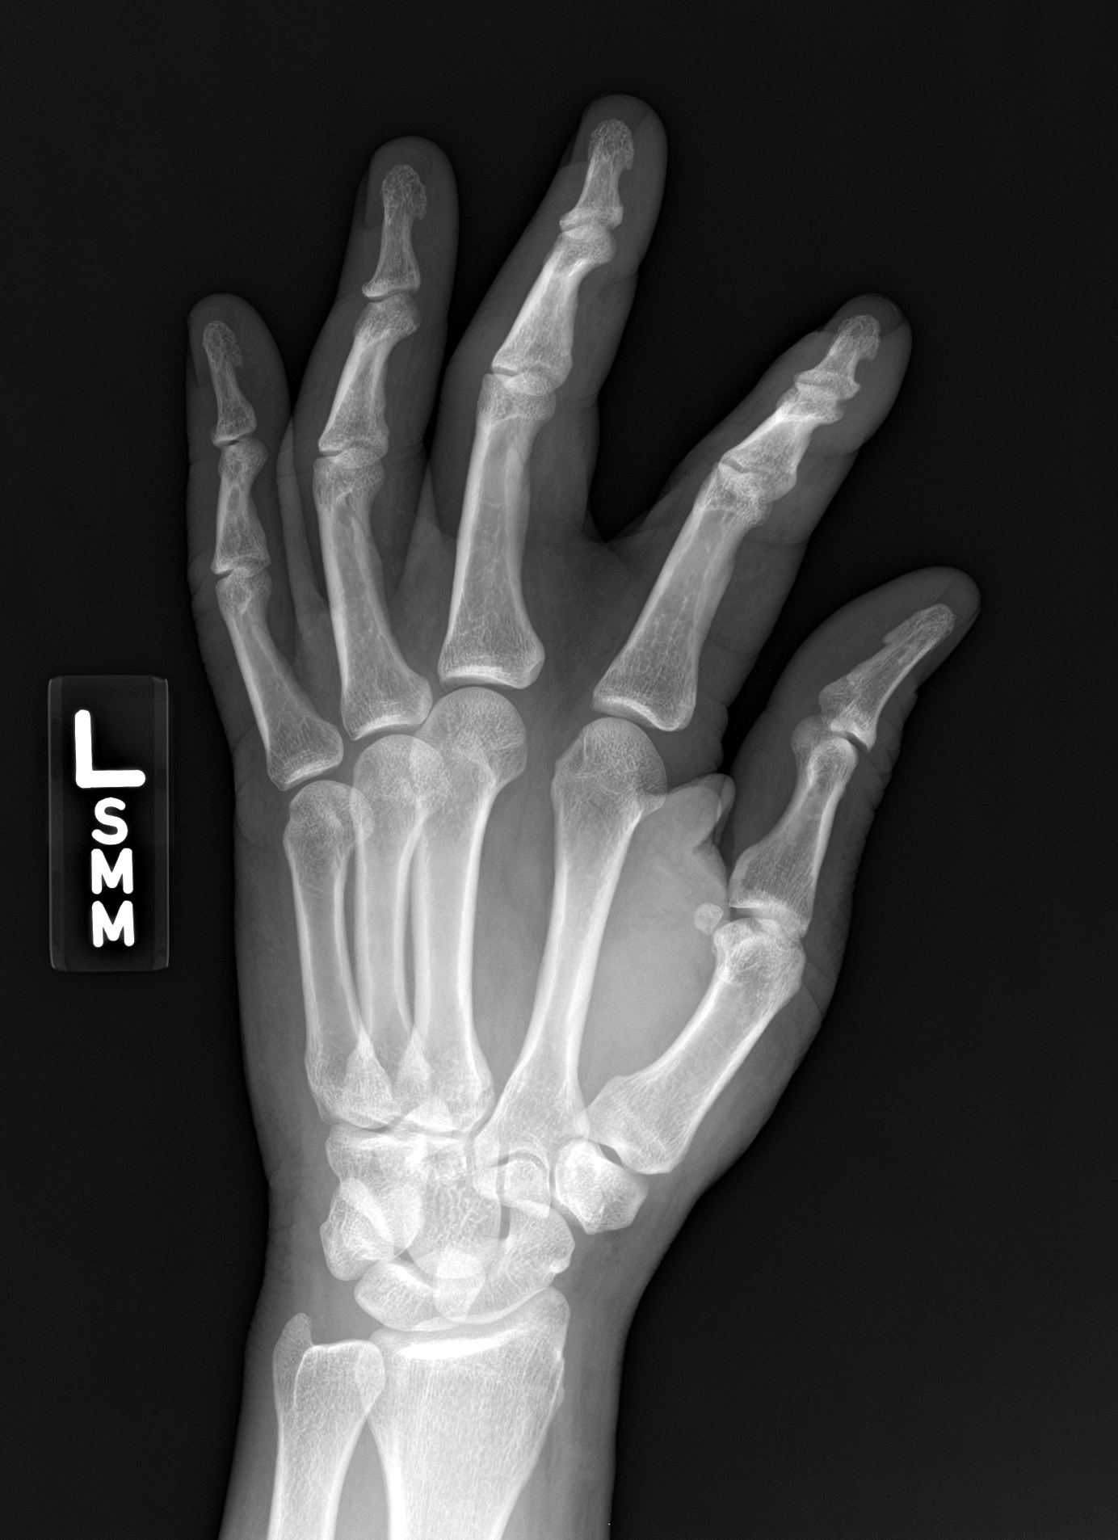

[hand lat]
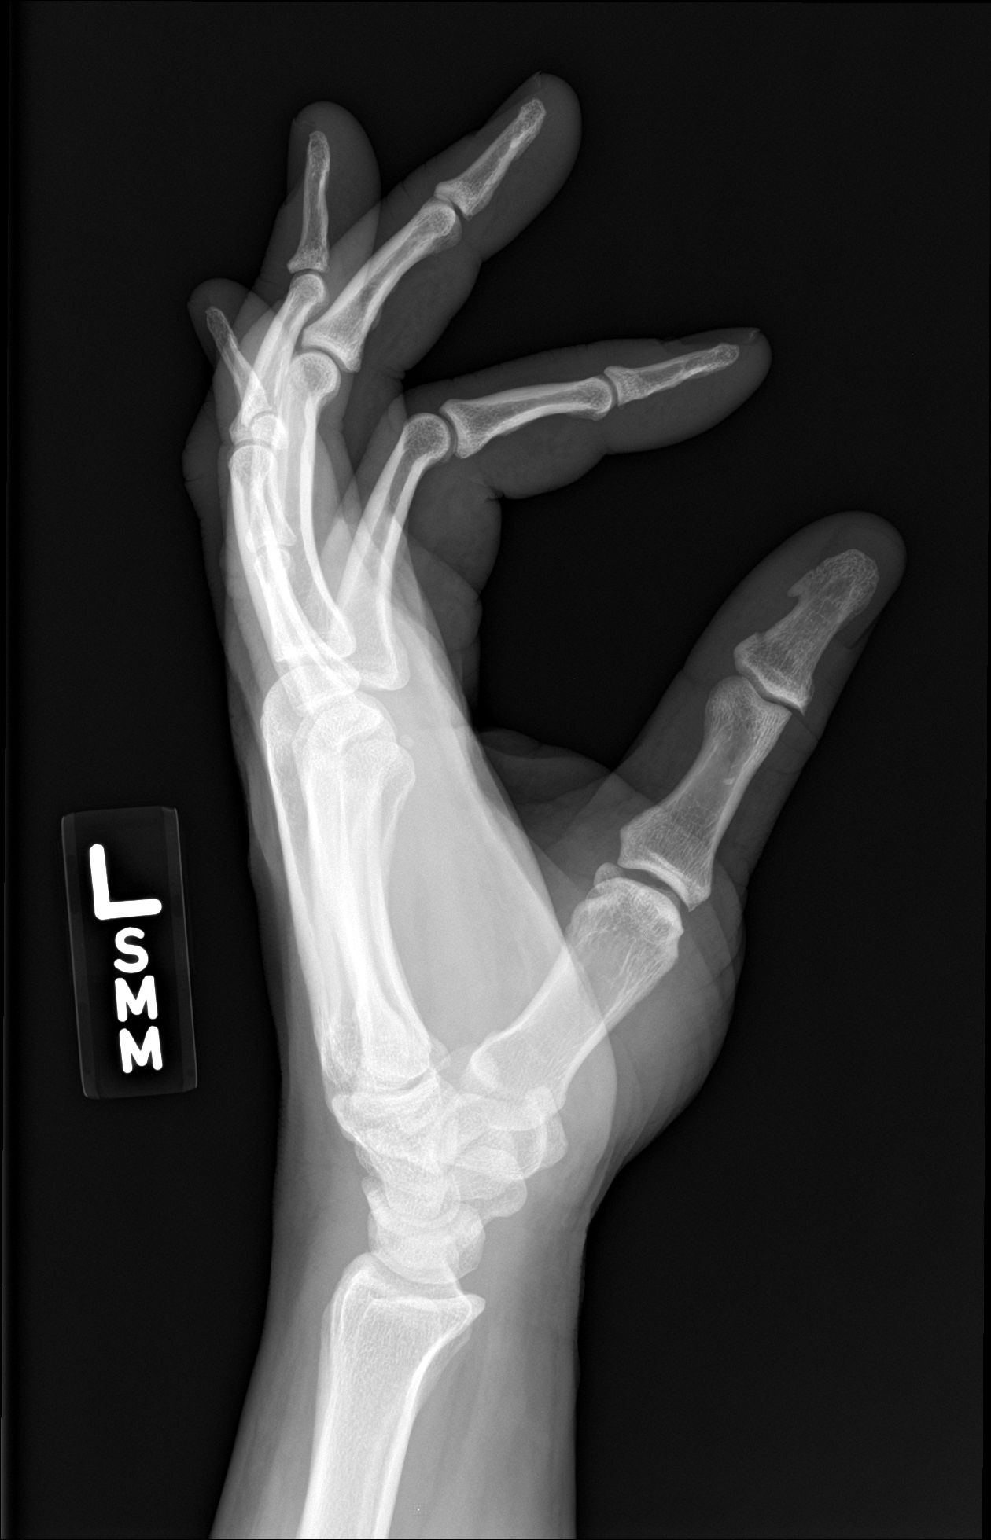

[3 of 3 positions shown; findings below may reference images not displayed]

FINDINGS: There is no evidence of fracture or dislocation. There is no
evidence of arthropathy or other focal bone abnormality. Soft
tissues are unremarkable.
IMPRESSION: Normal left hand.

## 2018-01-01 ENCOUNTER — Ambulatory Visit: Payer: BC Managed Care – PPO | Attending: Family Medicine | Admitting: Family Medicine

## 2018-01-01 ENCOUNTER — Encounter: Payer: Self-pay | Admitting: Family Medicine

## 2018-01-01 VITALS — BP 123/81 | HR 96 | Temp 98.0°F | Ht 72.0 in | Wt 291.4 lb

## 2018-01-01 DIAGNOSIS — Z79899 Other long term (current) drug therapy: Secondary | ICD-10-CM | POA: Insufficient documentation

## 2018-01-01 DIAGNOSIS — Z794 Long term (current) use of insulin: Secondary | ICD-10-CM

## 2018-01-01 DIAGNOSIS — E7800 Pure hypercholesterolemia, unspecified: Secondary | ICD-10-CM

## 2018-01-01 DIAGNOSIS — I10 Essential (primary) hypertension: Secondary | ICD-10-CM

## 2018-01-01 DIAGNOSIS — B353 Tinea pedis: Secondary | ICD-10-CM

## 2018-01-01 DIAGNOSIS — L84 Corns and callosities: Secondary | ICD-10-CM

## 2018-01-01 DIAGNOSIS — E119 Type 2 diabetes mellitus without complications: Secondary | ICD-10-CM | POA: Diagnosis not present

## 2018-01-01 DIAGNOSIS — Z7984 Long term (current) use of oral hypoglycemic drugs: Secondary | ICD-10-CM | POA: Insufficient documentation

## 2018-01-01 DIAGNOSIS — E785 Hyperlipidemia, unspecified: Secondary | ICD-10-CM | POA: Insufficient documentation

## 2018-01-01 LAB — POCT GLYCOSYLATED HEMOGLOBIN (HGB A1C): Hemoglobin A1C: 8.2 % — AB (ref 4.0–5.6)

## 2018-01-01 LAB — GLUCOSE, POCT (MANUAL RESULT ENTRY): POC Glucose: 139 mg/dl — AB (ref 70–99)

## 2018-01-01 MED ORDER — GLIPIZIDE ER 10 MG PO TB24: 20.0000 mg | ORAL_TABLET | Freq: Every day | ORAL | 1 refills | Status: DC

## 2018-01-01 MED ORDER — PNEUMOCOCCAL VAC POLYVALENT 25 MCG/0.5ML IJ INJ: 0.5000 mL | INJECTION | Freq: Once | INTRAMUSCULAR | 0 refills | Status: AC

## 2018-01-01 MED FILL — PNEUMOVAX 23 VIAL: 25 | 1 days supply | Qty: 1 | Fill #0

## 2018-01-01 NOTE — Progress Notes (Signed)
Subjective:  Patient ID: Anthony Vincent, male    DOB: [birth year]  Age: 46 y.o. MRN: [id number]  CC: Diabetes   HPI Anthony Vincent is a 47 year old male with a history of type 2 diabetes mellitus (A1c 8.2), hypertension, hyperlipidemia who presents today. Dose of Trulicity was increased at his last office visit however his A1c is 8.2 which has not changed much compared to 8.4 previously and he endorses compliance with his medications.  He denies visual concerns but has not had an annual eye exam and promises to call his ophthalmologist for an appointment.  He denies numbness in extremities or hypoglycemia Fassting sugars range from 130-170. Not fully compliant with a diabetic diet and exercise. He is doing well on his antihypertensive and statin. He does have a lesion on his foot which is annoying whenever he bears weight on it but he denies pain.  Past Medical History:  Diagnosis Date  . Diabetes mellitus without complication ([place])   . Hyperlipidemia   . Hypertension     Outpatient Medications Prior to Visit  Medication Sig Dispense Refill  . amLODipine (NORVASC) 10 MG tablet Take 1 tablet (10 mg total) by mouth daily. 90 tablet 1  . atorvastatin (LIPITOR) 40 MG tablet Take 1 tablet (40 mg total) by mouth daily. 90 tablet 1  . Dulaglutide (TRULICITY) 2.99 BZ/1.6RC SOPN Inject 1.5 mg into the skin once a week. 5 mL 3  . empagliflozin (JARDIANCE) 25 MG TABS tablet Take 25 mg by mouth daily. 90 tablet 1  . gabapentin (NEURONTIN) 300 MG capsule Take 1 capsule (300 mg total) by mouth 3 (three) times daily. 90 capsule 1  . lisinopril-hydrochlorothiazide (PRINZIDE,ZESTORETIC) 20-12.5 MG tablet Take 1 tablet by mouth daily. 90 tablet 1  . metFORMIN (GLUCOPHAGE) 1000 MG tablet Take 1 tablet (1,000 mg total) by mouth 2 (two) times daily with a meal. 180 tablet 1  . sildenafil (VIAGRA) 50 MG tablet Take 1 tablet (50 mg total) by mouth daily as needed for erectile dysfunction. 20 tablet 1  .  terbinafine (LAMISIL AT) 1 % cream Apply 1 application topically 2 (two) times daily. 30 g 1  . glipiZIDE (GLIPIZIDE XL) 10 MG 24 hr tablet Take 1 tablet (10 mg total) by mouth daily with breakfast. 90 tablet 1  . methocarbamol (ROBAXIN) 500 MG tablet Take 1 tablet (500 mg total) by mouth every 8 (eight) hours as needed for muscle spasms. (Patient not taking: Reported on 01/01/2018) 90 tablet 1   No facility-administered medications prior to visit.     ROS Review of Systems  Constitutional: Negative for activity change and appetite change.  HENT: Negative for sinus pressure and sore throat.   Eyes: Negative for visual disturbance.  Respiratory: Negative for cough, chest tightness and shortness of breath.   Cardiovascular: Negative for chest pain and leg swelling.  Gastrointestinal: Negative for abdominal distention, abdominal pain, constipation and diarrhea.  Endocrine: Negative.   Genitourinary: Negative for dysuria.  Musculoskeletal: Negative for joint swelling and myalgias.  Skin:       See hpi  Allergic/Immunologic: Negative.   Neurological: Negative for weakness, light-headedness and numbness.  Psychiatric/Behavioral: Negative for dysphoric mood and suicidal ideas.    Objective:  BP 123/81   Pulse 96   Temp 98 F (36.7 C) (Oral)   Ht 6' (1.829 m)   Wt 291 lb 6.4 oz (132.2 kg)   SpO2 97%   BMI 39.52 kg/m   BP/Weight 01/01/2018 10/01/2017 7/89/3810  Systolic BP 175 102  016  Diastolic BP 81 82 94  Wt. (Lbs) 291.4 290.8 -  BMI 39.52 39.44 -      Physical Exam  Constitutional: He is oriented to person, place, and time. He appears well-developed and well-nourished.  Cardiovascular: Normal rate, normal heart sounds and intact distal pulses.  No murmur heard. Pulmonary/Chest: Effort normal and breath sounds normal. He has no wheezes. He has no rales. He exhibits no tenderness.  Abdominal: Soft. Bowel sounds are normal. He exhibits no distension and no mass. There is no  tenderness.  Musculoskeletal: Normal range of motion.  Corn in anterior aspect of right sole  Neurological: He is alert and oriented to person, place, and time.  Skin:  Tinea pedis in fourth webspace of the left foot  Psychiatric: He has a normal mood and affect.     CMP Latest Ref Rng & Units 07/10/2017 08/15/2016 12/22/2015  Glucose 65 - 99 mg/dL 196(H) 245(H) 134(H)  BUN 6 - 24 mg/dL 11 10 11   Creatinine 0.76 - 1.27 mg/dL 1.25 0.96 1.12  Sodium 134 - 144 mmol/L 142 140 140  Potassium 3.5 - 5.2 mmol/L 4.3 4.7 4.6  Chloride 96 - 106 mmol/L 101 100 103  CO2 20 - 29 mmol/L 21 22 26   Calcium 8.7 - 10.2 mg/dL 9.8 9.7 9.6  Total Protein 6.0 - 8.5 g/dL 7.7 7.5 7.5  Total Bilirubin 0.0 - 1.2 mg/dL 0.4 0.8 1.0  Alkaline Phos 39 - 117 IU/L 78 72 58  AST 0 - 40 IU/L 15 18 17   ALT 0 - 44 IU/L 34 37 29    Lipid Panel     Component Value Date/Time   CHOL 126 07/10/2017 0851   TRIG 102 07/10/2017 0851   HDL 29 (L) 07/10/2017 0851   CHOLHDL 4.3 07/10/2017 0851   CHOLHDL 4.3 12/22/2015 1207   VLDL 11 12/22/2015 1207   LDLCALC 77 07/10/2017 0851    Lab Results  Component Value Date   HGBA1C 8.2 (A) 01/01/2018     Assessment & Plan:   1. Type 2 diabetes mellitus without complication, with long-term current use of insulin (HCC) Uncontrolled with A1c 8.2 Increased Glipizide dose Counseled on Diabetic diet, my plate method, 010 minutes of moderate intensity exercise/week Keep blood sugar logs with fasting goals of 80-120 mg/dl, random of less than 180 and in the event of sugars less than 60 mg/dl or greater than 400 mg/dl please notify the clinic ASAP. It is recommended that you undergo annual eye exams and annual foot exams. Pneumonia vaccine is recommended. - POCT glucose (manual entry) - POCT glycosylated hemoglobin (Hb A1C) - glipiZIDE (GLIPIZIDE XL) 10 MG 24 hr tablet; Take 2 tablets (20 mg total) by mouth daily with breakfast.  Dispense: 180 tablet; Refill: 1 - Ambulatory  referral to Podiatry  2. Corn - Ambulatory referral to Podiatry  3. Tinea pedis of left foot He has Lamisil at home which he will be using  4. Essential hypertension, benign Controlled Counseled on blood pressure goal of less than 130/80, low-sodium, DASH diet, medication compliance, 150 minutes of moderate intensity exercise per week. Discussed medication compliance, adverse effects. - Basic Metabolic Panel  5. Pure hypercholesterolemia Controlled Low cholesterol diet   Meds ordered this encounter  Medications  . glipiZIDE (GLIPIZIDE XL) 10 MG 24 hr tablet    Sig: Take 2 tablets (20 mg total) by mouth daily with breakfast.    Dispense:  180 tablet    Refill:  1  Discontinue previous dose    Follow-up: Return in about 3 months (around 04/03/2018) for Follow-up of chronic medical conditions.   [name] MD

## 2018-01-01 NOTE — Progress Notes (Signed)
Patient needs referral to podiatry.

## 2018-01-02 LAB — BASIC METABOLIC PANEL
BUN / CREAT RATIO: 10 (ref 9–20)
BUN: 12 mg/dL (ref 6–24)
CHLORIDE: 101 mmol/L (ref 96–106)
CO2: 23 mmol/L (ref 20–29)
CREATININE: 1.16 mg/dL (ref 0.76–1.27)
Calcium: 9.9 mg/dL (ref 8.7–10.2)
GFR calc non Af Amer: 74 mL/min/{1.73_m2} (ref >59)
GFR, EST AFRICAN AMERICAN: 85 mL/min/{1.73_m2} (ref >59)
Glucose: 128 mg/dL — ABNORMAL HIGH (ref 65–99)
Potassium: 4.5 mmol/L (ref 3.5–5.2)
SODIUM: 142 mmol/L (ref 134–144)

## 2018-01-03 ENCOUNTER — Telehealth: Payer: Self-pay

## 2018-01-03 NOTE — Telephone Encounter (Signed)
Patient was called and informed of lab results. 

## 2018-01-03 NOTE — Telephone Encounter (Signed)
-----   Message from [name], MD sent at 01/02/2018  6:41 AM EST ----- Please inform the patient that labs are normal. Thank you.

## 2018-02-13 ENCOUNTER — Ambulatory Visit: Payer: Self-pay | Admitting: Podiatry

## 2018-02-21 ENCOUNTER — Encounter: Payer: Self-pay | Admitting: Podiatry

## 2018-02-21 ENCOUNTER — Ambulatory Visit: Payer: BC Managed Care – PPO | Admitting: Podiatry

## 2018-02-21 DIAGNOSIS — M79674 Pain in right toe(s): Secondary | ICD-10-CM

## 2018-02-21 DIAGNOSIS — M79675 Pain in left toe(s): Secondary | ICD-10-CM

## 2018-02-21 DIAGNOSIS — B351 Tinea unguium: Secondary | ICD-10-CM

## 2018-02-21 MED ORDER — KETOCONAZOLE 2 % EX CREA: TOPICAL_CREAM | CUTANEOUS | 1 refills | Status: DC

## 2018-02-21 NOTE — Patient Instructions (Signed)
Diabetes Mellitus and Foot Care Foot care is an important part of your health, especially when you have diabetes. Diabetes may cause you to have problems because of poor blood flow (circulation) to your feet and legs, which can cause your skin to:  Become thinner and drier.  Break more easily.  Heal more slowly.  Peel and crack. You may also have nerve damage (neuropathy) in your legs and feet, causing decreased feeling in them. This means that you may not notice minor injuries to your feet that could lead to more serious problems. Noticing and addressing any potential problems early is the best way to prevent future foot problems. How to care for your feet Foot hygiene  Wash your feet daily with warm water and mild soap. Do not use hot water. Then, pat your feet and the areas between your toes until they are completely dry. Do not soak your feet as this can dry your skin.  Trim your toenails straight across. Do not dig under them or around the cuticle. File the edges of your nails with an emery board or nail file.  Apply a moisturizing lotion or petroleum jelly to the skin on your feet and to dry, brittle toenails. Use lotion that does not contain alcohol and is unscented. Do not apply lotion between your toes. Shoes and socks  Wear clean socks or stockings every day. Make sure they are not too tight. Do not wear knee-high stockings since they may decrease blood flow to your legs.  Wear shoes that fit properly and have enough cushioning. Always look in your shoes before you put them on to be sure there are no objects inside.  To break in new shoes, wear them for just a few hours a day. This prevents injuries on your feet. Wounds, scrapes, corns, and calluses  Check your feet daily for blisters, cuts, bruises, sores, and redness. If you cannot see the bottom of your feet, use a mirror or ask someone for help.  Do not cut corns or calluses or try to remove them with medicine.  If you  find a minor scrape, cut, or break in the skin on your feet, keep it and the skin around it clean and dry. You may clean these areas with mild soap and water. Do not clean the area with peroxide, alcohol, or iodine.  If you have a wound, scrape, corn, or callus on your foot, look at it several times a day to make sure it is healing and not infected. Check for: ? Redness, swelling, or pain. ? Fluid or blood. ? Warmth. ? Pus or a bad smell. General instructions  Do not cross your legs. This may decrease blood flow to your feet.  Do not use heating pads or hot water bottles on your feet. They may burn your skin. If you have lost feeling in your feet or legs, you may not know this is happening until it is too late.  Protect your feet from hot and cold by wearing shoes, such as at the beach or on hot pavement.  Schedule a complete foot exam at least once a year (annually) or more often if you have foot problems. If you have foot problems, report any cuts, sores, or bruises to your health care provider immediately. Contact a health care provider if:  You have a medical condition that increases your risk of infection and you have any cuts, sores, or bruises on your feet.  You have an injury that is not   healing.  You have redness on your legs or feet.  You feel burning or tingling in your legs or feet.  You have pain or cramps in your legs and feet.  Your legs or feet are numb.  Your feet always feel cold.  You have pain around a toenail. Get help right away if:  You have a wound, scrape, corn, or callus on your foot and: ? You have pain, swelling, or redness that gets worse. ? You have fluid or blood coming from the wound, scrape, corn, or callus. ? Your wound, scrape, corn, or callus feels warm to the touch. ? You have pus or a bad smell coming from the wound, scrape, corn, or callus. ? You have a fever. ? You have a red line going up your leg. Summary  Check your feet every day  for cuts, sores, red spots, swelling, and blisters.  Moisturize feet and legs daily.  Wear shoes that fit properly and have enough cushioning.  If you have foot problems, report any cuts, sores, or bruises to your health care provider immediately.  Schedule a complete foot exam at least once a year (annually) or more often if you have foot problems. This information is not intended to replace advice given to you by your health care provider. Make sure you discuss any questions you have with your health care provider. Document Released: 02/03/2000 Document Revised: 03/20/2017 Document Reviewed: 03/09/2016 Elsevier Interactive Patient Education  2019 [employer]  Corns and Calluses Corns are small areas of thickened skin that occur on the top, sides, or tip of a toe. They contain a cone-shaped core with a point that can press on a nerve below. This causes pain.  Calluses are areas of thickened skin that can occur anywhere on the body, including the hands, fingers, palms, soles of the feet, and heels. Calluses are usually larger than corns. What are the causes? Corns and calluses are caused by rubbing (friction) or pressure, such as from shoes that are too tight or do not fit properly. What increases the risk? Corns are more likely to develop in people who have misshapen toes (toe deformities), such as hammer toes. Calluses can occur with friction to any area of the skin. They are more likely to develop in people who:  Work with their hands.  Wear shoes that fit poorly, are too tight, or are high-heeled.  Have toe deformities. What are the signs or symptoms? Symptoms of a corn or callus include:  A hard growth on the skin.  Pain or tenderness under the skin.  Redness and swelling.  Increased discomfort while wearing tight-fitting shoes, if your feet are affected. If a corn or callus becomes infected, symptoms may include:  Redness and swelling that gets  worse.  Pain.  Fluid, blood, or pus draining from the corn or callus. How is this diagnosed? Corns and calluses may be diagnosed based on your symptoms, your medical history, and a physical exam. How is this treated? Treatment for corns and calluses may include:  Removing the cause of the friction or pressure. This may involve: ? Changing your shoes. ? Wearing shoe inserts (orthotics) or other protective layers in your shoes, such as a corn pad. ? Wearing gloves.  Applying medicine to the skin (topical medicine) to help soften skin in the hardened, thickened areas.  Removing layers of dead skin with a file to reduce the size of the corn or callus.  Removing the corn or callus with a scalpel   or laser.  Taking antibiotic medicines, if your corn or callus is infected.  Having surgery, if a toe deformity is the cause. Follow these instructions at home:   Take over-the-counter and prescription medicines only as told by your health care provider.  If you were prescribed an antibiotic, take it as told by your health care provider. Do not stop taking it even if your condition starts to improve.  Wear shoes that fit well. Avoid wearing high-heeled shoes and shoes that are too tight or too loose.  Wear any padding, protective layers, gloves, or orthotics as told by your health care provider.  Soak your hands or feet and then use a file or pumice stone to soften your corn or callus. Do this as told by your health care provider.  Check your corn or callus every day for symptoms of infection. Contact a health care provider if you:  Notice that your symptoms do not improve with treatment.  Have redness or swelling that gets worse.  Notice that your corn or callus becomes painful.  Have fluid, blood, or pus coming from your corn or callus.  Have new symptoms. Summary  Corns are small areas of thickened skin that occur on the top, sides, or tip of a toe.  Calluses are areas of  thickened skin that can occur anywhere on the body, including the hands, fingers, palms, and soles of the feet. Calluses are usually larger than corns.  Corns and calluses are caused by rubbing (friction) or pressure, such as from shoes that are too tight or do not fit properly.  Treatment may include wearing any padding, protective layers, gloves, or orthotics as told by your health care provider. This information is not intended to replace advice given to you by your health care provider. Make sure you discuss any questions you have with your health care provider. Document Released: 11/12/2003 Document Revised: 12/19/2016 Document Reviewed: 12/19/2016 Elsevier Interactive Patient Education  2019 [employer] Inc.  

## 2018-03-16 ENCOUNTER — Encounter: Payer: Self-pay | Admitting: Podiatry

## 2018-03-16 NOTE — Progress Notes (Signed)
Subjective: Anthony Vincent presents today with painful, thick toenails 1-5 b/l that he cannot cut and which interfere with daily activities.  Pain is aggravated when wearing enclosed shoe gear.  [name], MD is his PCP.  No Known Allergies  Objective:  Vascular Examination: Capillary refill time immediate x 10 digits  Dorsalis pedis and Posterior tibial pulses palpable b/l  Digital hair sparse x 10 digits  Skin temperature gradient WNL b/l  Dermatological Examination: Skin with normal turgor, texture and tone b/l  Toenails 1-5 b/l discolored, thick, dystrophic with subungual debris and pain with palpation to nailbeds due to thickness of nails.  Right great toe nail plate noted to be loose with foul smelling subungual debris. No underlying erythema, no edema, no drainage.  Musculoskeletal: Muscle strength 5/5 to all LE muscle groups  No gross bony deformities b/l.  No pain, crepitus or joint limitation noted with ROM.   Neurological: Sensation intact with 10 gram monofilament. Vibratory sensation intact.  Assessment: Painful onychomycosis toenails 1-5 b/l  Onycholysis right great toe  Plan: 1. Toenails 1-5 b/l were debrided in length and girth without iatrogenic bleeding. Right great toenail debrided to level of adherence. Exposed nail bed cleansed with alcohol. Prescription written for him to apply cream to apply Ketoconazole Cream to nailbed once daily for 4 weeks. 2. Patient to continue soft, supportive shoe gear 3. Patient to report any pedal injuries to medical professional immediately. 4. Follow up 3 months. Patient/POA to call should there be a concern in the interim.

## 2018-04-03 ENCOUNTER — Encounter: Payer: Self-pay | Admitting: Family Medicine

## 2018-04-03 ENCOUNTER — Ambulatory Visit: Payer: BC Managed Care – PPO | Attending: Family Medicine | Admitting: Family Medicine

## 2018-04-03 VITALS — BP 126/81 | HR 104 | Temp 97.9°F | Ht 72.0 in | Wt 289.2 lb

## 2018-04-03 DIAGNOSIS — E7800 Pure hypercholesterolemia, unspecified: Secondary | ICD-10-CM

## 2018-04-03 DIAGNOSIS — Z794 Long term (current) use of insulin: Secondary | ICD-10-CM | POA: Diagnosis not present

## 2018-04-03 DIAGNOSIS — E119 Type 2 diabetes mellitus without complications: Secondary | ICD-10-CM | POA: Diagnosis not present

## 2018-04-03 DIAGNOSIS — M7711 Lateral epicondylitis, right elbow: Secondary | ICD-10-CM

## 2018-04-03 DIAGNOSIS — I10 Essential (primary) hypertension: Secondary | ICD-10-CM | POA: Diagnosis not present

## 2018-04-03 DIAGNOSIS — Z1159 Encounter for screening for other viral diseases: Secondary | ICD-10-CM | POA: Diagnosis not present

## 2018-04-03 LAB — POCT GLYCOSYLATED HEMOGLOBIN (HGB A1C): HbA1c, POC (controlled diabetic range): 8.7 % — AB (ref 0.0–7.0)

## 2018-04-03 LAB — GLUCOSE, POCT (MANUAL RESULT ENTRY): POC Glucose: 186 mg/dl — AB (ref 70–99)

## 2018-04-03 MED ORDER — METFORMIN HCL 1000 MG PO TABS: 1000.0000 mg | ORAL_TABLET | Freq: Two times a day (BID) | ORAL | 1 refills | Status: DC

## 2018-04-03 MED ORDER — GLIPIZIDE ER 10 MG PO TB24: 20.0000 mg | ORAL_TABLET | Freq: Every day | ORAL | 1 refills | Status: DC

## 2018-04-03 MED ORDER — LISINOPRIL-HYDROCHLOROTHIAZIDE 20-12.5 MG PO TABS: 1.0000 | ORAL_TABLET | Freq: Every day | ORAL | 1 refills | Status: DC

## 2018-04-03 MED ORDER — MELOXICAM 7.5 MG PO TABS: 7.5000 mg | ORAL_TABLET | Freq: Every day | ORAL | 1 refills | Status: DC

## 2018-04-03 MED ORDER — EMPAGLIFLOZIN 25 MG PO TABS: 25.0000 mg | ORAL_TABLET | Freq: Every day | ORAL | 1 refills | Status: DC

## 2018-04-03 MED ORDER — AMLODIPINE BESYLATE 10 MG PO TABS: 10.0000 mg | ORAL_TABLET | Freq: Every day | ORAL | 1 refills | Status: DC

## 2018-04-03 MED ORDER — DULAGLUTIDE 1.5 MG/0.5ML ~~LOC~~ SOAJ: 1.5000 mg | SUBCUTANEOUS | 6 refills | Status: DC

## 2018-04-03 MED ORDER — GABAPENTIN 300 MG PO CAPS: 300.0000 mg | ORAL_CAPSULE | Freq: Three times a day (TID) | ORAL | 1 refills | Status: DC

## 2018-04-03 MED ORDER — ATORVASTATIN CALCIUM 40 MG PO TABS: 40.0000 mg | ORAL_TABLET | Freq: Every day | ORAL | 1 refills | Status: DC

## 2018-04-03 NOTE — Progress Notes (Signed)
Subjective:  Patient ID: Anthony Vincent, male    DOB: [birth year]  Age: 47 y.o. MRN: [id number]  CC: Diabetes and Hypertension   HPI Anthony Vincent is a 48 year old male with a history of type 2 diabetes mellitus (A1c 8.2), hypertension, hyperlipidemia who presents today for follow-up visit. His A1c is 8.7 which is up from 8.2 previously and he endorses compliance with his medications and is reluctant to starting insulin as he is of the opinion that initiation of insulin is associated with poor prognosis from discussion with his family members. He does not exercise regularly and compliance with a diabetic diet cannot be ascertained. Due for his annual eye exam and usually sees [facility].  Compliant with his antihypertensive and his statin and denies adverse effects from his medications  Past Medical History:  Diagnosis Date  . Diabetes mellitus without complication ([place])   . Hyperlipidemia   . Hypertension     History reviewed. No pertinent surgical history.  Family History  Problem Relation Age of Onset  . Diabetes Mother   . Diabetes Father     No Known Allergies  Outpatient Medications Prior to Visit  Medication Sig Dispense Refill  . sildenafil (VIAGRA) 50 MG tablet Take 1 tablet (50 mg total) by mouth daily as needed for erectile dysfunction. 20 tablet 1  . amLODipine (NORVASC) 10 MG tablet Take 1 tablet (10 mg total) by mouth daily. 90 tablet 1  . atorvastatin (LIPITOR) 40 MG tablet Take 1 tablet (40 mg total) by mouth daily. 90 tablet 1  . Dulaglutide (TRULICITY) 1.15 BW/6.2MB SOPN Inject 1.5 mg into the skin once a week. 5 mL 3  . empagliflozin (JARDIANCE) 25 MG TABS tablet Take 25 mg by mouth daily. 90 tablet 1  . gabapentin (NEURONTIN) 300 MG capsule Take 1 capsule (300 mg total) by mouth 3 (three) times daily. 90 capsule 1  . glipiZIDE (GLIPIZIDE XL) 10 MG 24 hr tablet Take 2 tablets (20 mg total) by mouth daily with breakfast. 180 tablet 1  .  lisinopril-hydrochlorothiazide (PRINZIDE,ZESTORETIC) 20-12.5 MG tablet Take 1 tablet by mouth daily. 90 tablet 1  . metFORMIN (GLUCOPHAGE) 1000 MG tablet Take 1 tablet (1,000 mg total) by mouth 2 (two) times daily with a meal. 180 tablet 1  . ketoconazole (NIZORAL) 2 % cream Apply to nailbed of right great toe once daily for 4 weeks. (Patient not taking: Reported on 04/03/2018) 15 g 1  . methocarbamol (ROBAXIN) 500 MG tablet Take 1 tablet (500 mg total) by mouth every 8 (eight) hours as needed for muscle spasms. (Patient not taking: Reported on 04/03/2018) 90 tablet 1  . terbinafine (LAMISIL AT) 1 % cream Apply 1 application topically 2 (two) times daily. (Patient not taking: Reported on 04/03/2018) 30 g 1   No facility-administered medications prior to visit.      ROS Review of Systems General: negative for fever, weight loss, appetite change Eyes: no visual symptoms. ENT: no ear symptoms, no sinus tenderness, no nasal congestion or sore throat. Neck: no pain  Respiratory: no wheezing, shortness of breath, cough Cardiovascular: no chest pain, no dyspnea on exertion, no pedal edema, no orthopnea. Gastrointestinal: no abdominal pain, no diarrhea, no constipation Genito-Urinary: no urinary frequency, no dysuria, no polyuria. Hematologic: no bruising Endocrine: no cold or heat intolerance Neurological: no headaches, no seizures, no tremors Musculoskeletal: See HPI Skin: no pruritus, no rash. Psychological: no depression, no anxiety,    Objective:  BP 126/81   Pulse (!) 104  Temp 97.9 F (36.6 C) (Oral)   Ht 6' (1.829 m)   Wt 289 lb 3.2 oz (131.2 kg)   SpO2 96%   BMI 39.22 kg/m   BP/Weight 04/03/2018 01/01/2018 03/01/5518  Systolic BP 802 233 612  Diastolic BP 81 81 82  Wt. (Lbs) 289.2 291.4 290.8  BMI 39.22 39.52 39.44      Physical Exam Constitutional: normal appearing,  Eyes: PERRLA HEENT: Head is atraumatic, normal sinuses, normal oropharynx, normal appearing tonsils  and palate, tympanic membrane is normal bilaterally. Neck: normal range of motion, no thyromegaly, no JVD Cardiovascular: normal rate and rhythm, normal heart sounds, no murmurs, rub or gallop, no pedal edema Respiratory: Normal breath sounds, clear to auscultation bilaterally, no wheezes, no rales, no rhonchi Abdomen: soft, not tender to palpation, normal bowel sounds, no enlarged organs Musculoskeletal: Normal appearance of elbow joint slight tenderness on deep palpation of lateral head of ulna.  Normal range of motion Skin: warm and dry, no lesions. Neurological: alert, oriented x3, cranial nerves I-XII grossly intact , normal motor strength, normal sensation. Psychological: normal mood.   CMP Latest Ref Rng & Units 01/01/2018 07/10/2017 08/15/2016  Glucose 65 - 99 mg/dL 128(H) 196(H) 245(H)  BUN 6 - 24 mg/dL _0 Creatinine 0.76 - 1.27 mg/dL 1.16 1.25 0.96  Sodium 134 - 144 mmol/L 142 142 140  Potassium 3.5 - 5.2 mmol/L 4.5 4.3 4.7  Chloride 96 - 106 mmol/L 101 101 100  CO2 20 - 29 mmol/L _1 Calcium 8.7 - 10.2 mg/dL 9.9 9.8 9.7  Total Protein 6.0 - 8.5 g/dL - 7.7 7.5  Total Bilirubin 0.0 - 1.2 mg/dL - 0.4 0.8  Alkaline Phos 39 - 117 IU/L - 78 72  AST 0 - 40 IU/L - 15 18  ALT 0 - 44 IU/L - 34 37    Lipid Panel     Component Value Date/Time   CHOL 126 07/10/2017 0851   TRIG 102 07/10/2017 0851   HDL 29 (L) 07/10/2017 0851   CHOLHDL 4.3 07/10/2017 0851   CHOLHDL 4.3 12/22/2015 1207   VLDL 11 12/22/2015 1207   LDLCALC 77 07/10/2017 0851    CBC    Component Value Date/Time   WBC 6.6 12/22/2015 1207   RBC 5.88 (H) 12/22/2015 1207   HGB 16.0 12/22/2015 1207   HCT 46.6 12/22/2015 1207   PLT 264 12/22/2015 1207   MCV 79.3 (L) 12/22/2015 1207   MCH 27.2 12/22/2015 1207   MCHC 34.3 12/22/2015 1207   RDW 15.3 (H) 12/22/2015 1207   LYMPHSABS 2,244 12/22/2015 1207   MONOABS 462 12/22/2015 1207   EOSABS 132 12/22/2015 1207   BASOSABS 66 12/22/2015 1207    Lab  Results  Component Value Date   HGBA1C 8.7 (A) 04/03/2018    Assessment & Plan:   1. Type 2 diabetes mellitus without complication, with long-term current use of insulin (HCC) Uncontrolled with A1c of 8.7 He is resisting initiation of insulin and promises to work on his lifestyle If A1c remains uncontrolled at next visit we will have to initiate insulin; we have discussed the merits he has regarding insulin but he does not seem to be convinced. Discussed cardiovascular and other complications of uncontrolled diabetes mellitus Counseled on Diabetic diet, my plate method, 244 minutes of moderate intensity exercise/week Keep blood sugar logs with fasting goals of 80-120 mg/dl, random of less than 180 and in the event of sugars less than 60 mg/dl or greater than  400 mg/dl please notify the clinic ASAP. It is recommended that you undergo annual eye exams and annual foot exams. Pneumonia vaccine is recommended. - POCT glucose (manual entry) - POCT glycosylated hemoglobin (Hb A1C) - Microalbumin/Creatinine Ratio, Urine - empagliflozin (JARDIANCE) 25 MG TABS tablet; Take 25 mg by mouth daily.  Dispense: 90 tablet; Refill: 1 - gabapentin (NEURONTIN) 300 MG capsule; Take 1 capsule (300 mg total) by mouth 3 (three) times daily.  Dispense: 90 capsule; Refill: 1 - glipiZIDE (GLIPIZIDE XL) 10 MG 24 hr tablet; Take 2 tablets (20 mg total) by mouth daily with breakfast.  Dispense: 180 tablet; Refill: 1 - metFORMIN (GLUCOPHAGE) 1000 MG tablet; Take 1 tablet (1,000 mg total) by mouth 2 (two) times daily with a meal.  Dispense: 180 tablet; Refill: 1 - meloxicam (MOBIC) 7.5 MG tablet; Take 1 tablet (7.5 mg total) by mouth daily.  Dispense: 30 tablet; Refill: 1  2. Essential hypertension, benign Controlled Counseled on blood pressure goal of less than 130/80, low-sodium, DASH diet, medication compliance, 150 minutes of moderate intensity exercise per week. Discussed medication compliance, adverse effects. -  CMP14+EGFR - Lipid panel - amLODipine (NORVASC) 10 MG tablet; Take 1 tablet (10 mg total) by mouth daily.  Dispense: 90 tablet; Refill: 1 - lisinopril-hydrochlorothiazide (PRINZIDE,ZESTORETIC) 20-12.5 MG tablet; Take 1 tablet by mouth daily.  Dispense: 90 tablet; Refill: 1  3. Pure hypercholesterolemia Controlled Low-cholesterol diet - atorvastatin (LIPITOR) 40 MG tablet; Take 1 tablet (40 mg total) by mouth daily.  Dispense: 90 tablet; Refill: 1  4. Screening for viral disease - HIV antibody (with reflex) - meloxicam (MOBIC) 7.5 MG tablet; Take 1 tablet (7.5 mg total) by mouth daily.  Dispense: 30 tablet; Refill: 1  5. Lateral epicondylitis of right elbow Placed on meloxicam   Meds ordered this encounter  Medications  . amLODipine (NORVASC) 10 MG tablet    Sig: Take 1 tablet (10 mg total) by mouth daily.    Dispense:  90 tablet    Refill:  1  . atorvastatin (LIPITOR) 40 MG tablet    Sig: Take 1 tablet (40 mg total) by mouth daily.    Dispense:  90 tablet    Refill:  1  . Dulaglutide 1.5 MG/0.5ML SOPN    Sig: Inject 1.5 mg into the skin once a week.    Dispense:  4 pen    Refill:  6  . empagliflozin (JARDIANCE) 25 MG TABS tablet    Sig: Take 25 mg by mouth daily.    Dispense:  90 tablet    Refill:  1  . gabapentin (NEURONTIN) 300 MG capsule    Sig: Take 1 capsule (300 mg total) by mouth 3 (three) times daily.    Dispense:  90 capsule    Refill:  1  . glipiZIDE (GLIPIZIDE XL) 10 MG 24 hr tablet    Sig: Take 2 tablets (20 mg total) by mouth daily with breakfast.    Dispense:  180 tablet    Refill:  1    Discontinue previous dose  . lisinopril-hydrochlorothiazide (PRINZIDE,ZESTORETIC) 20-12.5 MG tablet    Sig: Take 1 tablet by mouth daily.    Dispense:  90 tablet    Refill:  1  . metFORMIN (GLUCOPHAGE) 1000 MG tablet    Sig: Take 1 tablet (1,000 mg total) by mouth 2 (two) times daily with a meal.    Dispense:  180 tablet    Refill:  1  . meloxicam (MOBIC) 7.5 MG  tablet  Sig: Take 1 tablet (7.5 mg total) by mouth daily.    Dispense:  30 tablet    Refill:  1    Follow-up: Return in about 3 months (around 07/02/2018) for Follow-up of chronic medical conditions.       [name], MD, FAAFP. [facility] [place] [facility] [place], [place]   04/03/2018, 10:12 AM

## 2018-04-03 NOTE — Patient Instructions (Signed)
Tennis Elbow  Tennis elbow is swelling (inflammation) in your outer forearm, near your elbow. Swelling affects the tissues that connect muscle to bone (tendons). Tennis elbow can happen in any sport or job in which you use your elbow too much. It is caused by doing the same motion over and over. Tennis elbow can cause:   Pain and tenderness in your forearm and the outer part of your elbow. You may have pain all the time, or only when using the arm.   A burning feeling. This runs from your elbow through your arm.   Weak grip in your hand.  Follow these instructions at home:  Activity   Rest your elbow and wrist. Avoid activities that cause problems, as told by your doctor.   If told by your doctor, wear an elbow strap to reduce stress on the area.   Do physical therapy exercises as told.   If you lift an object, lift it with your palm facing up. This is easier on your elbow.  Lifestyle   If your tennis elbow is caused by sports, check your equipment and make sure that:  ? You are using it correctly.  ? It fits you well.   If your tennis elbow is caused by work or by using a computer, take breaks often to stretch your arm. Talk with your manager about how you can manage your condition at work.  If you have a brace:   Wear the brace as told by your doctor. Remove it only as told by your doctor.   Loosen the brace if your fingers tingle, get numb, or turn cold and blue.   Keep the brace clean.   If the brace is not waterproof, ask your doctor if you may take the brace off for bathing. If you must keep the brace on while bathing:  ? Do not let it get wet.  ? Cover it with a watertight covering when you take a bath or a shower.  General instructions     If told, put ice on the painful area:  ? Put ice in a plastic bag.  ? Place a towel between your skin and the bag.  ? Leave the ice on for 20 minutes, 2-3 times a day.   Take over-the-counter and prescription medicines only as told by your doctor.   Keep  all follow-up visits as told by your doctor. This is important.  Contact a doctor if:   Your pain does not get better with treatment.   Your pain gets worse.   You have weakness in your forearm, hand, or fingers.   You cannot feel your forearm, hand, or fingers.  Summary   Tennis elbow is swelling (inflammation) in your outer forearm, near your elbow.   Tennis elbow is caused by doing the same motion over and over.   Rest your elbow and wrist. Avoid activities that cause problems, as told by your doctor.   If told, put ice on the painful area for 20 minutes, 2-3 times a day.  This information is not intended to replace advice given to you by your health care provider. Make sure you discuss any questions you have with your health care provider.  Document Released: 07/26/2009 Document Revised: 11/20/2016 Document Reviewed: 11/20/2016  Elsevier Interactive Patient Education  2019 [employer]

## 2018-04-03 NOTE — Progress Notes (Signed)
Patient is having pain in right elbow.

## 2018-04-04 ENCOUNTER — Telehealth: Payer: Self-pay

## 2018-04-04 LAB — CMP14+EGFR
ALT: 35 IU/L (ref 0–44)
AST: 17 IU/L (ref 0–40)
Albumin/Globulin Ratio: 1.6 (ref 1.2–2.2)
Albumin: 4.8 g/dL (ref 4.0–5.0)
Alkaline Phosphatase: 92 IU/L (ref 39–117)
BUN/Creatinine Ratio: 9 (ref 9–20)
BUN: 11 mg/dL (ref 6–24)
Bilirubin Total: 0.8 mg/dL (ref 0.0–1.2)
CALCIUM: 9.9 mg/dL (ref 8.7–10.2)
CO2: 21 mmol/L (ref 20–29)
Chloride: 101 mmol/L (ref 96–106)
Creatinine, Ser: 1.17 mg/dL (ref 0.76–1.27)
GFR calc Af Amer: 84 mL/min/{1.73_m2} (ref >59)
GFR calc non Af Amer: 73 mL/min/{1.73_m2} (ref >59)
Globulin, Total: 3 g/dL (ref 1.5–4.5)
Glucose: 166 mg/dL — ABNORMAL HIGH (ref 65–99)
Potassium: 4.3 mmol/L (ref 3.5–5.2)
Sodium: 142 mmol/L (ref 134–144)
Total Protein: 7.8 g/dL (ref 6.0–8.5)

## 2018-04-04 LAB — LIPID PANEL
Chol/HDL Ratio: 3.9 ratio (ref 0.0–5.0)
Cholesterol, Total: 106 mg/dL (ref 100–199)
HDL: 27 mg/dL — AB (ref >39)
LDL Calculated: 62 mg/dL (ref 0–99)
Triglycerides: 83 mg/dL (ref 0–149)
VLDL Cholesterol Cal: 17 mg/dL (ref 5–40)

## 2018-04-04 LAB — HIV ANTIBODY (ROUTINE TESTING W REFLEX): HIV SCREEN 4TH GENERATION: NONREACTIVE

## 2018-04-04 NOTE — Telephone Encounter (Signed)
Patient was called and informed of lab results. 

## 2018-04-04 NOTE — Telephone Encounter (Signed)
-----   Message from [name], MD sent at 04/04/2018  8:39 AM EST ----- HIV is negative, total cholesterol is normal however HDL (good cholesterol) is low and this can be increased by exercising, low-cholesterol diet.

## 2018-05-23 ENCOUNTER — Encounter: Payer: Self-pay | Admitting: Podiatry

## 2018-05-23 ENCOUNTER — Ambulatory Visit: Payer: BC Managed Care – PPO | Admitting: Podiatry

## 2018-05-23 ENCOUNTER — Other Ambulatory Visit: Payer: Self-pay

## 2018-05-23 DIAGNOSIS — M79675 Pain in left toe(s): Secondary | ICD-10-CM

## 2018-05-23 DIAGNOSIS — M79674 Pain in right toe(s): Secondary | ICD-10-CM

## 2018-05-23 DIAGNOSIS — Q828 Other specified congenital malformations of skin: Secondary | ICD-10-CM

## 2018-05-23 DIAGNOSIS — E1165 Type 2 diabetes mellitus with hyperglycemia: Secondary | ICD-10-CM | POA: Diagnosis not present

## 2018-05-23 DIAGNOSIS — B351 Tinea unguium: Secondary | ICD-10-CM

## 2018-05-26 NOTE — Progress Notes (Addendum)
Subjective: Anthony Vincent presents today with painful, thick toenails 1-5 b/l that he cannot cut and which interfere with daily activities.  Pain is aggravated when wearing enclosed shoe gear.  Patient states right great toe nail grew back and has not changed. "My nail did not get any better."  He also relates numbness to plantar midfoot area of right foot for the past few weeks. He has tried nothing to treat this.  [name], MD is his PCP and last visit was 04/03/2018.   No Known Allergies  Objective:  Vascular Examination: Capillary refill time immediate x 10 digits  Dorsalis pedis and Posterior tibial pulses palpable b/l  Digital hair sparse x 10 digits  Skin temperature gradient WNL b/l  Dermatological Examination: Skin with normal turgor, texture and tone b/l  Toenails left hallux and 2-5 b/l discolored, thick, dystrophic with subungual debris and pain with palpation to nailbeds due to thickness of nails.  Right great toe nail plate has grown back discolored, thick and dystrophic with subungual debris.  Porokeratotic lesion submet head 2 right foot with tenderness to palpation. No erythema, no edema, no drainage, no flocculence.  Musculoskeletal: Muscle strength 5/5 to all LE muscle groups  No gross bony deformities b/l.  No pain, crepitus or joint limitation noted with ROM.   Neurological: Sensation intact with 10 gram monofilament.  Vibratory sensation intact.  A1C: 8.7  Assessment: Painful onychomycosis toenails 1-5 b/l  Porokeratosis submet head 2 right foot NIDDM uncontrolled  Plan: 1. He is getting married and has a wedding tentatively planned for August 30, 2018, depending on the Coronavirus situation. 2. Toenails 1-5 b/l were debrided in length and girth without iatrogenic bleeding. Patient is interested in laser therapy for onychomycosis. Given RN's business card and will schedule appointment at patient's convenience and office  availability. 3. Nail clippings right hallux sent for fungal culture.  Porokeratosis submet head 2 right foot pared and enucleated with sterile scalpel blade without incident. 4. Patient to continue soft, supportive shoe gear daily. 5. Patient to report any pedal injuries to medical professional immediately. 6. Follow up 4 months.  7. Patient/POA to call should there be a concern in the interim.

## 2018-06-06 ENCOUNTER — Telehealth: Payer: Self-pay | Admitting: *Deleted

## 2018-06-06 NOTE — Telephone Encounter (Signed)
-----   Message from Marzetta Board, DPM sent at 06/05/2018  5:06 PM EDT ----- Regarding: Please call patient with Results of Nail Culture Please call Anthony Vincent with results of toenail culture which was positive for fungus. He will schedule laser therapy with Janett Billow at his convenience.  Thanks Val! (smile)

## 2018-06-06 NOTE — Telephone Encounter (Signed)
Left message requesting return call to discuss results and orders.

## 2018-07-03 ENCOUNTER — Other Ambulatory Visit: Payer: Self-pay

## 2018-07-03 ENCOUNTER — Encounter: Payer: Self-pay | Admitting: Family Medicine

## 2018-07-03 ENCOUNTER — Ambulatory Visit: Payer: BC Managed Care – PPO | Attending: Family Medicine | Admitting: Family Medicine

## 2018-07-03 DIAGNOSIS — E7800 Pure hypercholesterolemia, unspecified: Secondary | ICD-10-CM | POA: Diagnosis not present

## 2018-07-03 DIAGNOSIS — Z794 Long term (current) use of insulin: Secondary | ICD-10-CM

## 2018-07-03 DIAGNOSIS — E119 Type 2 diabetes mellitus without complications: Secondary | ICD-10-CM

## 2018-07-03 DIAGNOSIS — I10 Essential (primary) hypertension: Secondary | ICD-10-CM | POA: Diagnosis not present

## 2018-07-03 NOTE — Progress Notes (Signed)
Virtual Visit via Video Note  I connected with Anthony Vincent, on 07/03/2018 at 11:24 AM by video enabled telemedicine device due to the COVID-19 pandemic and verified that I am speaking with the correct person using two identifiers.   Consent: I discussed the limitations, risks, security and privacy concerns of performing an evaluation and management service by telemedicine and the availability of in person appointments. I also discussed with the patient that there may be a patient responsible charge related to this service. The patient expressed understanding and agreed to proceed.   Location of Patient: Home  Location of Provider: Clinic   Persons participating in Telemedicine visit: Ramses Klecka Farrington-CMA Dr. Felecia Shelling     History of Present Illness: Anthony Vincent is a 48 year old male with a history of type 2 diabetes mellitus (A1c 8.7 from 03/2018), hypertension, hyperlipidemia who presents today for follow-up visit. He has been checking his blood sugars regularly with fasting sugars between 120 and 140 with the highest at 165.  He denies numbness in his extremities, blurry vision and has been compliant with his medications but not exercising much.  Adhering to a diabetic diet. Doing well on his antihypertensive but does not have a means of checking his blood pressure.  Tolerating his statin with no complaints of myalgias or other adverse effects.   Past Medical History:  Diagnosis Date  . Diabetes mellitus without complication ([place])   . Hyperlipidemia   . Hypertension    No Known Allergies  Current Outpatient Medications on File Prior to Visit  Medication Sig Dispense Refill  . Dulaglutide 1.5 MG/0.5ML SOPN Inject 1.5 mg into the skin once a week. 4 pen 6  . empagliflozin (JARDIANCE) 25 MG TABS tablet Take 25 mg by mouth daily. 90 tablet 1  . gabapentin (NEURONTIN) 300 MG capsule Take 1 capsule (300 mg total) by mouth 3 (three) times daily. 90 capsule 1   . glipiZIDE (GLIPIZIDE XL) 10 MG 24 hr tablet Take 2 tablets (20 mg total) by mouth daily with breakfast. 180 tablet 1  . lisinopril-hydrochlorothiazide (PRINZIDE,ZESTORETIC) 20-12.5 MG tablet Take 1 tablet by mouth daily. 90 tablet 1  . metFORMIN (GLUCOPHAGE) 1000 MG tablet Take 1 tablet (1,000 mg total) by mouth 2 (two) times daily with a meal. 180 tablet 1  . sildenafil (VIAGRA) 50 MG tablet Take 1 tablet (50 mg total) by mouth daily as needed for erectile dysfunction. 20 tablet 1  . amLODipine (NORVASC) 10 MG tablet Take 1 tablet (10 mg total) by mouth daily. 90 tablet 1  . atorvastatin (LIPITOR) 40 MG tablet Take 1 tablet (40 mg total) by mouth daily. 90 tablet 1  . ketoconazole (NIZORAL) 2 % cream Apply to nailbed of right great toe once daily for 4 weeks. (Patient not taking: Reported on 04/03/2018) 15 g 1  . meloxicam (MOBIC) 7.5 MG tablet Take 1 tablet (7.5 mg total) by mouth daily. (Patient not taking: Reported on 05/23/2018) 30 tablet 1  . methocarbamol (ROBAXIN) 500 MG tablet Take 1 tablet (500 mg total) by mouth every 8 (eight) hours as needed for muscle spasms. (Patient not taking: Reported on 04/03/2018) 90 tablet 1  . terbinafine (LAMISIL AT) 1 % cream Apply 1 application topically 2 (two) times daily. (Patient not taking: Reported on 04/03/2018) 30 g 1   No current facility-administered medications on file prior to visit.     Observations/Objective: Alert, awake, oriented x3 Not in acute distress  CMP Latest Ref Rng & Units 04/03/2018 01/01/2018 07/10/2017  Glucose 65 - 99 mg/dL 166(H) 128(H) 196(H)  BUN 6 - 24 mg/dL 11 12 11   Creatinine 0.76 - 1.27 mg/dL 1.17 1.16 1.25  Sodium 134 - 144 mmol/L 142 142 142  Potassium 3.5 - 5.2 mmol/L 4.3 4.5 4.3  Chloride 96 - 106 mmol/L 101 101 101  CO2 20 - 29 mmol/L 21 23 21   Calcium 8.7 - 10.2 mg/dL 9.9 9.9 9.8  Total Protein 6.0 - 8.5 g/dL 7.8 - 7.7  Total Bilirubin 0.0 - 1.2 mg/dL 0.8 - 0.4  Alkaline Phos 39 - 117 IU/L 92 - 78  AST 0 -  40 IU/L 17 - 15  ALT 0 - 44 IU/L 35 - 34    Lipid Panel     Component Value Date/Time   CHOL 106 04/03/2018 1029   TRIG 83 04/03/2018 1029   HDL 27 (L) 04/03/2018 1029   CHOLHDL 3.9 04/03/2018 1029   CHOLHDL 4.3 12/22/2015 1207   VLDL 11 12/22/2015 1207   LDLCALC 62 04/03/2018 1029    Assessment and Plan: 1. Pure hypercholesterolemia Controlled Low-cholesterol diet Continue Lipitor  2. Type 2 diabetes mellitus without complication, with long-term current use of insulin (HCC) Uncontrolled with A1c of 8.7 He is working on lifestyle modifications as he had resisted initiation of insulin A1c at next in person visit in 3 months and if still not at goal of less than 7 we will commence insulin Counseled on Diabetic diet, my plate method, 659 minutes of moderate intensity exercise/week Keep blood sugar logs with fasting goals of 80-120 mg/dl, random of less than 180 and in the event of sugars less than 60 mg/dl or greater than 400 mg/dl please notify the clinic ASAP. It is recommended that you undergo annual eye exams and annual foot exams. Pneumonia vaccine is recommended.  3. Essential hypertension, benign Stable -blood pressure was controlled at 126/81 at his last visit Continue lisinopril/HCTZ   Follow Up Instructions: 3 months   I discussed the assessment and treatment plan with the patient. The patient was provided an opportunity to ask questions and all were answered. The patient agreed with the plan and demonstrated an understanding of the instructions.   The patient was advised to call back or seek an in-person evaluation if the symptoms worsen or if the condition fails to improve as anticipated.     I provided 16 minutes total of Telehealth time during this encounter including median intraservice time, reviewing previous notes, labs, imaging, medications and explaining diagnosis and management.     [name], MD, FAAFP. [facility] and  [place], [place]   07/03/2018, 11:24 AM

## 2018-07-03 NOTE — Progress Notes (Signed)
Patient has been called and DOB has been verified. Patient has been screened and transferred to PCP to start webex visit.

## 2018-07-17 ENCOUNTER — Telehealth: Payer: Self-pay | Admitting: Family Medicine

## 2018-07-17 NOTE — Telephone Encounter (Signed)
Anthony Vincent the after hour nurse was contacted by Ailene Ravel with [employer] says the pharmacy called to get clarification for a medication but didn't specify which medication. Please follow up.

## 2018-07-23 ENCOUNTER — Telehealth: Payer: Self-pay | Admitting: Family Medicine

## 2018-07-23 NOTE — Telephone Encounter (Signed)
Patient called stating they would like to get clarification regarding their gabapentin. Patient states that they dont remember the dosage being increased. Please follow up.

## 2018-07-23 NOTE — Telephone Encounter (Signed)
Patients call returned.  Patient identified by name and date of birth.  Patient went to pick up his gabapentin prescription and was confused as to the dosage.  Patient insists he has been taking 100mg  for years and the new prescription is for 300mg .  Patient is suppose to be taking medications 3 times a day.  Patient states that he has been taking 300mg  total, as he states for years. Patient denies any pain.  Patient told to continue as he has normally been taking gabapentin.  Information will be obtained and a return call made to clarify dosage.  Patient acknowledged understanding of advice.

## 2018-08-08 ENCOUNTER — Other Ambulatory Visit: Payer: Self-pay | Admitting: Family Medicine

## 2018-08-08 DIAGNOSIS — Z794 Long term (current) use of insulin: Secondary | ICD-10-CM

## 2018-08-08 DIAGNOSIS — E119 Type 2 diabetes mellitus without complications: Secondary | ICD-10-CM

## 2018-08-11 ENCOUNTER — Other Ambulatory Visit: Payer: Self-pay | Admitting: Family Medicine

## 2018-08-11 DIAGNOSIS — Z794 Long term (current) use of insulin: Secondary | ICD-10-CM

## 2018-08-11 DIAGNOSIS — E119 Type 2 diabetes mellitus without complications: Secondary | ICD-10-CM

## 2018-08-28 ENCOUNTER — Ambulatory Visit: Payer: BC Managed Care – PPO | Admitting: Podiatry

## 2018-09-08 ENCOUNTER — Telehealth: Payer: Self-pay | Admitting: Family Medicine

## 2018-09-08 NOTE — Telephone Encounter (Signed)
Patient called wanting to know if he could be prescribed antibiotics for some swelling near his face advised to schedule an appt with walk-in. Please follow up.

## 2018-09-11 ENCOUNTER — Other Ambulatory Visit: Payer: Self-pay

## 2018-09-11 ENCOUNTER — Ambulatory Visit: Payer: BC Managed Care – PPO | Attending: Family Medicine | Admitting: Physician Assistant

## 2018-09-11 VITALS — BP 116/84 | HR 91 | Temp 98.2°F | Resp 16 | Wt 288.4 lb

## 2018-09-11 DIAGNOSIS — L03211 Cellulitis of face: Secondary | ICD-10-CM

## 2018-09-11 DIAGNOSIS — Z794 Long term (current) use of insulin: Secondary | ICD-10-CM

## 2018-09-11 DIAGNOSIS — N529 Male erectile dysfunction, unspecified: Secondary | ICD-10-CM

## 2018-09-11 DIAGNOSIS — I10 Essential (primary) hypertension: Secondary | ICD-10-CM | POA: Diagnosis not present

## 2018-09-11 DIAGNOSIS — N528 Other male erectile dysfunction: Secondary | ICD-10-CM

## 2018-09-11 DIAGNOSIS — L0390 Cellulitis, unspecified: Secondary | ICD-10-CM

## 2018-09-11 DIAGNOSIS — E119 Type 2 diabetes mellitus without complications: Secondary | ICD-10-CM

## 2018-09-11 DIAGNOSIS — E7800 Pure hypercholesterolemia, unspecified: Secondary | ICD-10-CM

## 2018-09-11 LAB — POCT GLYCOSYLATED HEMOGLOBIN (HGB A1C): HbA1c, POC (controlled diabetic range): 7.9 % — AB (ref 0.0–7.0)

## 2018-09-11 LAB — GLUCOSE, POCT (MANUAL RESULT ENTRY): POC Glucose: 157 mg/dl — AB (ref 70–99)

## 2018-09-11 MED ORDER — LISINOPRIL-HYDROCHLOROTHIAZIDE 20-12.5 MG PO TABS: 1.0000 | ORAL_TABLET | Freq: Every day | ORAL | 1 refills | Status: DC

## 2018-09-11 MED ORDER — DULAGLUTIDE 1.5 MG/0.5ML ~~LOC~~ SOAJ: 1.5000 mg | SUBCUTANEOUS | 6 refills | Status: DC

## 2018-09-11 MED ORDER — MUPIROCIN CALCIUM 2 % NA OINT: 1.0000 "application " | TOPICAL_OINTMENT | Freq: Two times a day (BID) | NASAL | 0 refills | Status: DC

## 2018-09-11 MED ORDER — JARDIANCE 25 MG PO TABS: 25.0000 mg | ORAL_TABLET | Freq: Every day | ORAL | 1 refills | Status: DC

## 2018-09-11 MED ORDER — METFORMIN HCL 1000 MG PO TABS: ORAL_TABLET | ORAL | 2 refills | Status: DC

## 2018-09-11 MED ORDER — GABAPENTIN 300 MG PO CAPS: 300.0000 mg | ORAL_CAPSULE | Freq: Three times a day (TID) | ORAL | 1 refills | Status: DC

## 2018-09-11 MED ORDER — AMLODIPINE BESYLATE 10 MG PO TABS: 10.0000 mg | ORAL_TABLET | Freq: Every day | ORAL | 1 refills | Status: DC

## 2018-09-11 MED ORDER — GLIPIZIDE ER 10 MG PO TB24: 20.0000 mg | ORAL_TABLET | Freq: Every day | ORAL | 1 refills | Status: DC

## 2018-09-11 MED ORDER — SILDENAFIL CITRATE 50 MG PO TABS: 50.0000 mg | ORAL_TABLET | Freq: Every day | ORAL | 1 refills | Status: DC | PRN

## 2018-09-11 MED ORDER — ATORVASTATIN CALCIUM 40 MG PO TABS: 40.0000 mg | ORAL_TABLET | Freq: Every day | ORAL | 1 refills | Status: DC

## 2018-09-11 MED ORDER — DOXYCYCLINE HYCLATE 100 MG PO TABS: 100.0000 mg | ORAL_TABLET | Freq: Two times a day (BID) | ORAL | 0 refills | Status: DC

## 2018-09-11 NOTE — Progress Notes (Signed)
Pt states he has questions regarding his gabapentin  Pt states he think he has a bacteria infection

## 2018-09-11 NOTE — Progress Notes (Signed)
Patient ID: Anthony Vincent, male   DOB: [birth year], 48 y.o.   MRN: [id number]   Anthony Vincent, is a 48 y.o. male  [id number]  [id number]:[id number]  DOB - 25-Dec-1970  Subjective:  Chief Complaint and HPI: Anthony Vincent is a 48 y.o. male here today to C/o recurrent cellulitis, L side of face.  He has had this before.  He has also had a skin infection like this on his R scalp before.  The L side of face has been tender and swollen for about 3-5 days.  Denies tooth or sinus pain.  No fever.    Working on doing better with eating less sugar.    ROS:   Constitutional:  No f/c, No night sweats, No unexplained weight loss. EENT:  No vision changes, No blurry vision, No hearing changes. No mouth, throat, or ear problems.  Respiratory: No cough, No SOB Cardiac: No CP, no palpitations GI:  No abd pain, No N/V/D. GU: No Urinary s/sx Musculoskeletal: No joint pain Neuro: No headache, no dizziness, no motor weakness.  Skin: No rash Endocrine:  No polydipsia. No polyuria.  Psych: Denies SI/HI  No problems updated.  ALLERGIES: No Known Allergies  PAST MEDICAL HISTORY: Past Medical History:  Diagnosis Date  . Diabetes mellitus without complication ([place])   . Hyperlipidemia   . Hypertension     MEDICATIONS AT HOME: Prior to Admission medications   Medication Sig Start Date End Date Taking? Authorizing Provider  amLODipine (NORVASC) 10 MG tablet Take 1 tablet (10 mg total) by mouth daily. 09/11/18   Argentina Donovan, PA-C  atorvastatin (LIPITOR) 40 MG tablet Take 1 tablet (40 mg total) by mouth daily. 09/11/18   Argentina Donovan, PA-C  doxycycline (VIBRA-TABS) 100 MG tablet Take 1 tablet (100 mg total) by mouth 2 (two) times daily. 09/11/18   Argentina Donovan, PA-C  Dulaglutide 1.5 MG/0.5ML SOPN Inject 1.5 mg into the skin once a week. 09/11/18   Argentina Donovan, PA-C  empagliflozin (JARDIANCE) 25 MG TABS tablet Take 25 mg by mouth daily. 09/11/18   Argentina Donovan, PA-C  gabapentin  (NEURONTIN) 300 MG capsule Take 1 capsule (300 mg total) by mouth 3 (three) times daily. 09/11/18   Argentina Donovan, PA-C  glipiZIDE (GLIPIZIDE XL) 10 MG 24 hr tablet Take 2 tablets (20 mg total) by mouth daily with breakfast. 09/11/18   Argentina Donovan, PA-C  ketoconazole (NIZORAL) 2 % cream Apply to nailbed of right great toe once daily for 4 weeks. Patient not taking: Reported on 04/03/2018 02/21/18   Marzetta Board, DPM  lisinopril-hydrochlorothiazide (ZESTORETIC) 20-12.5 MG tablet Take 1 tablet by mouth daily. 09/11/18   Argentina Donovan, PA-C  metFORMIN (GLUCOPHAGE) 1000 MG tablet TAKE ONE TABLET BY MOUTH TWICE A DAY WITH A MEAL 09/11/18   Freeman Caldron M, PA-C  mupirocin nasal ointment (BACTROBAN) 2 % Place 1 application into the nose 2 (two) times daily. Use one-half of tube in each nostril twice daily for five (5) days. After application, press sides of nose together and gently massage. 09/11/18   Argentina Donovan, PA-C  sildenafil (VIAGRA) 50 MG tablet Take 1 tablet (50 mg total) by mouth daily as needed for erectile dysfunction. 09/11/18   Argentina Donovan, PA-C  terbinafine (LAMISIL AT) 1 % cream Apply 1 application topically 2 (two) times daily. Patient not taking: Reported on 04/03/2018 06/26/17   [name], MD     Objective:  EXAM:   Vitals:  09/11/18 1330  BP: 116/84  Pulse: 91  Resp: 16  Temp: 98.2 F (36.8 C)  TempSrc: Oral  SpO2: 99%  Weight: 288 lb 6.4 oz (130.8 kg)    General appearance : A&OX3. NAD. Non-toxic-appearing HEENT: Atraumatic and Normocephalic.  PERRLA. EOM intact.  L cheek with mild erythema and swelling.   Neck: supple, no JVD. No cervical lymphadenopathy. No thyromegaly Chest/Lungs:  Breathing-non-labored, Good air entry bilaterally, breath sounds normal without rales, rhonchi, or wheezing  CVS: S1 S2 regular, no murmurs, gallops, rubs  Extremities: Bilateral Lower Ext shows no edema, both legs are warm to touch with = pulse throughout  Neurology:  CN II-XII grossly intact, Non focal.   Psych:  TP linear. J/I WNL. Normal speech. Appropriate eye contact and affect.  Skin:  No Rash  Data Review Lab Results  Component Value Date   HGBA1C 7.9 (A) 09/11/2018   HGBA1C 8.7 (A) 04/03/2018   HGBA1C 8.2 (A) 01/01/2018     Assessment & Plan   1. Type 2 diabetes mellitus without complication, with long-term current use of insulin ([place]) Improved since last visit.  At last visit, other labs were unremarkable(03/2018).  He is going to work to eliminate sugars and white carbs for improved control and continue his current medication regimen - Glucose (CBG) - HgB A1c - Dulaglutide 1.5 MG/0.5ML SOPN; Inject 1.5 mg into the skin once a week.  Dispense: 4 pen; Refill: 6 - empagliflozin (JARDIANCE) 25 MG TABS tablet; Take 25 mg by mouth daily.  Dispense: 90 tablet; Refill: 1 - gabapentin (NEURONTIN) 300 MG capsule; Take 1 capsule (300 mg total) by mouth 3 (three) times daily.  Dispense: 270 capsule; Refill: 1 - glipiZIDE (GLIPIZIDE XL) 10 MG 24 hr tablet; Take 2 tablets (20 mg total) by mouth daily with breakfast.  Dispense: 180 tablet; Refill: 1 - metFORMIN (GLUCOPHAGE) 1000 MG tablet; TAKE ONE TABLET BY MOUTH TWICE A DAY WITH A MEAL  Dispense: 60 tablet; Refill: 2  2. Essential hypertension, benign - amLODipine (NORVASC) 10 MG tablet; Take 1 tablet (10 mg total) by mouth daily.  Dispense: 90 tablet; Refill: 1 - lisinopril-hydrochlorothiazide (ZESTORETIC) 20-12.5 MG tablet; Take 1 tablet by mouth daily.  Dispense: 90 tablet; Refill: 1  3. Pure hypercholesterolemia - atorvastatin (LIPITOR) 40 MG tablet; Take 1 tablet (40 mg total) by mouth daily.  Dispense: 90 tablet; Refill: 1  4. Erectile dysfunction, unspecified erectile dysfunction type - sildenafil (VIAGRA) 50 MG tablet; Take 1 tablet (50 mg total) by mouth daily as needed for erectile dysfunction.  Dispense: 20 tablet; Refill: 1  5. Cellulitis, unspecified cellulitis site -  doxycycline (VIBRA-TABS) 100 MG tablet; Take 1 tablet (100 mg total) by mouth 2 (two) times daily.  Dispense: 20 tablet; Refill: 0 - mupirocin nasal ointment (BACTROBAN) 2 %; Place 1 application into the nose 2 (two) times daily. Use one-half of tube in each nostril twice daily for five (5) days. After application, press sides of nose together and gently massage.  Dispense: 10 g; Refill: 0  Patient have been counseled extensively about nutrition and exercise  Return in about 3 months (around 12/12/2018).  The patient was given clear instructions to go to ER or return to medical center if symptoms don't improve, worsen or new problems develop. The patient verbalized understanding. The patient was told to call to get lab results if they haven't heard anything in the next week.     Freeman Caldron, PA-C [place] [facility] [place],  [place] [phone]   09/11/2018, 1:33 PM

## 2018-12-16 ENCOUNTER — Ambulatory Visit: Payer: BC Managed Care – PPO | Attending: Family Medicine | Admitting: Family Medicine

## 2018-12-16 ENCOUNTER — Other Ambulatory Visit: Payer: Self-pay

## 2018-12-16 ENCOUNTER — Encounter: Payer: Self-pay | Admitting: Family Medicine

## 2018-12-16 ENCOUNTER — Other Ambulatory Visit: Payer: Self-pay | Admitting: Family Medicine

## 2018-12-16 VITALS — BP 132/85 | HR 95 | Temp 98.3°F | Ht 72.0 in | Wt 291.0 lb

## 2018-12-16 DIAGNOSIS — Z23 Encounter for immunization: Secondary | ICD-10-CM | POA: Diagnosis not present

## 2018-12-16 DIAGNOSIS — E1142 Type 2 diabetes mellitus with diabetic polyneuropathy: Secondary | ICD-10-CM

## 2018-12-16 DIAGNOSIS — E7800 Pure hypercholesterolemia, unspecified: Secondary | ICD-10-CM

## 2018-12-16 DIAGNOSIS — I10 Essential (primary) hypertension: Secondary | ICD-10-CM | POA: Diagnosis not present

## 2018-12-16 DIAGNOSIS — Z125 Encounter for screening for malignant neoplasm of prostate: Secondary | ICD-10-CM

## 2018-12-16 DIAGNOSIS — Z794 Long term (current) use of insulin: Secondary | ICD-10-CM | POA: Diagnosis not present

## 2018-12-16 DIAGNOSIS — Z1211 Encounter for screening for malignant neoplasm of colon: Secondary | ICD-10-CM

## 2018-12-16 DIAGNOSIS — M549 Dorsalgia, unspecified: Secondary | ICD-10-CM

## 2018-12-16 LAB — POCT GLYCOSYLATED HEMOGLOBIN (HGB A1C): HbA1c, POC (controlled diabetic range): 7.7 % — AB (ref 0.0–7.0)

## 2018-12-16 LAB — GLUCOSE, POCT (MANUAL RESULT ENTRY): POC Glucose: 161 mg/dl — AB (ref 70–99)

## 2018-12-16 MED ORDER — DULAGLUTIDE 1.5 MG/0.5ML ~~LOC~~ SOAJ: 1.5000 mg | SUBCUTANEOUS | 6 refills | Status: DC

## 2018-12-16 MED ORDER — METFORMIN HCL 1000 MG PO TABS: ORAL_TABLET | ORAL | 1 refills | Status: DC

## 2018-12-16 MED ORDER — LISINOPRIL-HYDROCHLOROTHIAZIDE 20-12.5 MG PO TABS: 1.0000 | ORAL_TABLET | Freq: Every day | ORAL | 1 refills | Status: DC

## 2018-12-16 MED ORDER — GABAPENTIN 300 MG PO CAPS: 300.0000 mg | ORAL_CAPSULE | Freq: Three times a day (TID) | ORAL | 1 refills | Status: DC

## 2018-12-16 MED ORDER — AMLODIPINE BESYLATE 10 MG PO TABS: 10.0000 mg | ORAL_TABLET | Freq: Every day | ORAL | 1 refills | Status: DC

## 2018-12-16 MED ORDER — GLIPIZIDE ER 10 MG PO TB24: 20.0000 mg | ORAL_TABLET | Freq: Every day | ORAL | 1 refills | Status: DC

## 2018-12-16 MED ORDER — JARDIANCE 25 MG PO TABS: 25.0000 mg | ORAL_TABLET | Freq: Every day | ORAL | 1 refills | Status: DC

## 2018-12-16 MED ORDER — ATORVASTATIN CALCIUM 40 MG PO TABS: 40.0000 mg | ORAL_TABLET | Freq: Every day | ORAL | 1 refills | Status: DC

## 2018-12-16 NOTE — Patient Instructions (Signed)
° °Calorie Counting for Weight Loss °Calories are units of energy. Your body needs a certain amount of calories from food to keep you going throughout the day. When you eat more calories than your body needs, your body stores the extra calories as fat. When you eat fewer calories than your body needs, your body burns fat to get the energy it needs. °Calorie counting means keeping track of how many calories you eat and drink each day. Calorie counting can be helpful if you need to lose weight. If you make sure to eat fewer calories than your body needs, you should lose weight. Ask your health care provider what a healthy weight is for you. °For calorie counting to work, you will need to eat the right number of calories in a day in order to lose a healthy amount of weight per week. A dietitian can help you determine how many calories you need in a day and will give you suggestions on how to reach your calorie goal. °· A healthy amount of weight to lose per week is usually 1-2 lb (0.5-0.9 kg). This usually means that your daily calorie intake should be reduced by 500-750 calories. °· Eating 1,200 - 1,500 calories per day can help most women lose weight. °· Eating 1,500 - 1,800 calories per day can help most men lose weight. °What is my plan? °My goal is to have __________ calories per day. °If I have this many calories per day, I should lose around __________ pounds per week. °What do I need to know about calorie counting? °In order to meet your daily calorie goal, you will need to: °· Find out how many calories are in each food you would like to eat. Try to do this before you eat. °· Decide how much of the food you plan to eat. °· Write down what you ate and how many calories it had. Doing this is called keeping a food log. °To successfully lose weight, it is important to balance calorie counting with a healthy lifestyle that includes regular activity. Aim for 150 minutes of moderate exercise (such as walking) or 75  minutes of vigorous exercise (such as running) each week. °Where do I find calorie information? ° °The number of calories in a food can be found on a Nutrition Facts label. If a food does not have a Nutrition Facts label, try to look up the calories online or ask your dietitian for help. °Remember that calories are listed per serving. If you choose to have more than one serving of a food, you will have to multiply the calories per serving by the amount of servings you plan to eat. For example, the label on a package of bread might say that a serving size is 1 slice and that there are 90 calories in a serving. If you eat 1 slice, you will have eaten 90 calories. If you eat 2 slices, you will have eaten 180 calories. °How do I keep a food log? °Immediately after each meal, record the following information in your food log: °· What you ate. Don't forget to include toppings, sauces, and other extras on the food. °· How much you ate. This can be measured in cups, ounces, or number of items. °· How many calories each food and drink had. °· The total number of calories in the meal. °Keep your food log near you, such as in a small notebook in your pocket, or use a mobile app or website. Some programs will   calculate calories for you and show you how many calories you have left for the day to meet your goal. °What are some calorie counting tips? ° °· Use your calories on foods and drinks that will fill you up and not leave you hungry: °? Some examples of foods that fill you up are nuts and nut butters, vegetables, lean proteins, and high-fiber foods like whole grains. High-fiber foods are foods with more than 5 g fiber per serving. °? Drinks such as sodas, specialty coffee drinks, alcohol, and juices have a lot of calories, yet do not fill you up. °· Eat nutritious foods and avoid empty calories. Empty calories are calories you get from foods or beverages that do not have many vitamins or protein, such as candy, sweets, and  soda. It is better to have a nutritious high-calorie food (such as an avocado) than a food with few nutrients (such as a bag of chips). °· Know how many calories are in the foods you eat most often. This will help you calculate calorie counts faster. °· Pay attention to calories in drinks. Low-calorie drinks include water and unsweetened drinks. °· Pay attention to nutrition labels for "low fat" or "fat free" foods. These foods sometimes have the same amount of calories or more calories than the full fat versions. They also often have added sugar, starch, or salt, to make up for flavor that was removed with the fat. °· Find a way of tracking calories that works for you. Get creative. Try different apps or programs if writing down calories does not work for you. °What are some portion control tips? °· Know how many calories are in a serving. This will help you know how many servings of a certain food you can have. °· Use a measuring cup to measure serving sizes. You could also try weighing out portions on a kitchen scale. With time, you will be able to estimate serving sizes for some foods. °· Take some time to put servings of different foods on your favorite plates, bowls, and cups so you know what a serving looks like. °· Try not to eat straight from a bag or box. Doing this can lead to overeating. Put the amount you would like to eat in a cup or on a plate to make sure you are eating the right portion. °· Use smaller plates, glasses, and bowls to prevent overeating. °· Try not to multitask (for example, watch TV or use your computer) while eating. If it is time to eat, sit down at a table and enjoy your food. This will help you to know when you are full. It will also help you to be aware of what you are eating and how much you are eating. °What are tips for following this plan? °Reading food labels °· Check the calorie count compared to the serving size. The serving size may be smaller than what you are used to  eating. °· Check the source of the calories. Make sure the food you are eating is high in vitamins and protein and low in saturated and trans fats. °Shopping °· Read nutrition labels while you shop. This will help you make healthy decisions before you decide to purchase your food. °· Make a grocery list and stick to it. °Cooking °· Try to cook your favorite foods in a healthier way. For example, try baking instead of frying. °· Use low-fat dairy products. °Meal planning °· Use more fruits and vegetables. Half of your plate should be   fruits and vegetables. °· Include lean proteins like poultry and fish. °How do I count calories when eating out? °· Ask for smaller portion sizes. °· Consider sharing an entree and sides instead of getting your own entree. °· If you get your own entree, eat only half. Ask for a box at the beginning of your meal and put the rest of your entree in it so you are not tempted to eat it. °· If calories are listed on the menu, choose the lower calorie options. °· Choose dishes that include vegetables, fruits, whole grains, low-fat dairy products, and lean protein. °· Choose items that are boiled, broiled, grilled, or steamed. Stay away from items that are buttered, battered, fried, or served with cream sauce. Items labeled "crispy" are usually fried, unless stated otherwise. °· Choose water, low-fat milk, unsweetened iced tea, or other drinks without added sugar. If you want an alcoholic beverage, choose a lower calorie option such as a glass of wine or light beer. °· Ask for dressings, sauces, and syrups on the side. These are usually high in calories, so you should limit the amount you eat. °· If you want a salad, choose a garden salad and ask for grilled meats. Avoid extra toppings like bacon, cheese, or fried items. Ask for the dressing on the side, or ask for olive oil and vinegar or lemon to use as dressing. °· Estimate how many servings of a food you are given. For example, a serving of  cooked rice is ½ cup or about the size of half a baseball. Knowing serving sizes will help you be aware of how much food you are eating at restaurants. The list below tells you how big or small some common portion sizes are based on everyday objects: °? 1 oz--4 stacked dice. °? 3 oz--1 deck of cards. °? 1 tsp--1 die. °? 1 Tbsp--½ a ping-pong ball. °? 2 Tbsp--1 ping-pong ball. °? ½ cup--½ baseball. °? 1 cup--1 baseball. °Summary °· Calorie counting means keeping track of how many calories you eat and drink each day. If you eat fewer calories than your body needs, you should lose weight. °· A healthy amount of weight to lose per week is usually 1-2 lb (0.5-0.9 kg). This usually means reducing your daily calorie intake by 500-750 calories. °· The number of calories in a food can be found on a Nutrition Facts label. If a food does not have a Nutrition Facts label, try to look up the calories online or ask your dietitian for help. °· Use your calories on foods and drinks that will fill you up, and not on foods and drinks that will leave you hungry. °· Use smaller plates, glasses, and bowls to prevent overeating. °This information is not intended to replace advice given to you by your health care provider. Make sure you discuss any questions you have with your health care provider. °Document Released: 02/05/2005 Document Revised: 10/25/2017 Document Reviewed: 01/06/2016 °Elsevier Patient Education © 2020 [employer] ° °

## 2018-12-16 NOTE — Progress Notes (Signed)
Subjective:  Patient ID: Anthony Vincent, male    DOB: [birth year]  Age: 48 y.o. MRN: [id number]  CC: Diabetes   HPI Anthony Vincent is a 48 year old male with a history of type 2 diabetes mellitus (A1c 7.7 from 03/2018), hypertension, hyperlipidemia who presents today for follow-up visit. His A1c is 7.7 which is down from 8.7 previously and he has been working on his lifestyle and has been compliant with diabetic diet and his medications.  He endorses neuropathy in his feet which he describes as sharp pains at other times dull pains with associated tingling in the toes of his right foot and sometimes on the ball of his right foot.  He had held off on his increased dose of gabapentin because he was of the opinion the dose had been too high however he recently started administering it as prescribed.  He is currently on 300 mg of gabapentin 3 times daily. Denies hypoglycemia or visual concerns. Hypertension is stable and he is compliant with his antihypertensive and he is doing well on his statin with no complaints of myalgias.  He does have intermittent low back pain which he noticed while working in his yard and this has been intermittent and does not radiate down his lower extremities.  Pain is absent at this time.  Past Medical History:  Diagnosis Date  . Diabetes mellitus without complication ([place])   . Hyperlipidemia   . Hypertension     History reviewed. No pertinent surgical history.  Family History  Problem Relation Age of Onset  . Diabetes Mother   . Diabetes Father     No Known Allergies  Outpatient Medications Prior to Visit  Medication Sig Dispense Refill  . mupirocin nasal ointment (BACTROBAN) 2 % Place 1 application into the nose 2 (two) times daily. Use one-half of tube in each nostril twice daily for five (5) days. After application, press sides of nose together and gently massage. 10 g 0  . sildenafil (VIAGRA) 50 MG tablet Take 1 tablet (50 mg total) by mouth daily as  needed for erectile dysfunction. 20 tablet 1  . amLODipine (NORVASC) 10 MG tablet Take 1 tablet (10 mg total) by mouth daily. 90 tablet 1  . atorvastatin (LIPITOR) 40 MG tablet Take 1 tablet (40 mg total) by mouth daily. 90 tablet 1  . Dulaglutide 1.5 MG/0.5ML SOPN Inject 1.5 mg into the skin once a week. 4 pen 6  . empagliflozin (JARDIANCE) 25 MG TABS tablet Take 25 mg by mouth daily. 90 tablet 1  . gabapentin (NEURONTIN) 300 MG capsule Take 1 capsule (300 mg total) by mouth 3 (three) times daily. 270 capsule 1  . glipiZIDE (GLIPIZIDE XL) 10 MG 24 hr tablet Take 2 tablets (20 mg total) by mouth daily with breakfast. 180 tablet 1  . lisinopril-hydrochlorothiazide (ZESTORETIC) 20-12.5 MG tablet Take 1 tablet by mouth daily. 90 tablet 1  . metFORMIN (GLUCOPHAGE) 1000 MG tablet TAKE ONE TABLET BY MOUTH TWICE A DAY WITH A MEAL 60 tablet 2  . doxycycline (VIBRA-TABS) 100 MG tablet Take 1 tablet (100 mg total) by mouth 2 (two) times daily. (Patient not taking: Reported on 12/16/2018) 20 tablet 0  . ketoconazole (NIZORAL) 2 % cream Apply to nailbed of right great toe once daily for 4 weeks. (Patient not taking: Reported on 04/03/2018) 15 g 1  . terbinafine (LAMISIL AT) 1 % cream Apply 1 application topically 2 (two) times daily. (Patient not taking: Reported on 04/03/2018) 30 g 1   No  facility-administered medications prior to visit.      ROS Review of Systems  Constitutional: Negative for activity change and appetite change.  HENT: Negative for sinus pressure and sore throat.   Eyes: Negative for visual disturbance.  Respiratory: Negative for cough, chest tightness and shortness of breath.   Cardiovascular: Negative for chest pain and leg swelling.  Gastrointestinal: Negative for abdominal distention, abdominal pain, constipation and diarrhea.  Endocrine: Negative.   Genitourinary: Negative for dysuria.  Musculoskeletal:       See hpi  Skin: Negative for rash.  Allergic/Immunologic: Negative.    Neurological: Positive for numbness. Negative for weakness and light-headedness.  Psychiatric/Behavioral: Negative for dysphoric mood and suicidal ideas.    Objective:  BP 132/85   Pulse 95   Temp 98.3 F (36.8 C) (Oral)   Ht 6' (1.829 m)   Wt 291 lb (132 kg)   SpO2 96%   BMI 39.47 kg/m   BP/Weight 12/16/2018 09/11/2018 0000000  Systolic BP [id number]  Diastolic BP 85 84 81  Wt. (Lbs) 291 288.4 289.2  BMI 39.47 39.11 39.22      Physical Exam Constitutional:      Appearance: He is well-developed.  Neck:     Vascular: No JVD.  Cardiovascular:     Rate and Rhythm: Normal rate.     Heart sounds: Normal heart sounds. No murmur.  Pulmonary:     Effort: Pulmonary effort is normal.     Breath sounds: Normal breath sounds. No wheezing or rales.  Chest:     Chest wall: No tenderness.  Abdominal:     General: Bowel sounds are normal. There is no distension.     Palpations: Abdomen is soft. There is no mass.     Tenderness: There is no abdominal tenderness.  Musculoskeletal: Normal range of motion.     Right lower leg: No edema.     Left lower leg: No edema.  Neurological:     Mental Status: He is alert and oriented to person, place, and time.  Psychiatric:        Mood and Affect: Mood normal.     CMP Latest Ref Rng & Units 04/03/2018 01/01/2018 07/10/2017  Glucose 65 - 99 mg/dL 166(H) 128(H) 196(H)  BUN 6 - 24 mg/dL 11 12 11   Creatinine 0.76 - 1.27 mg/dL 1.17 1.16 1.25  Sodium 134 - 144 mmol/L 142 142 142  Potassium 3.5 - 5.2 mmol/L 4.3 4.5 4.3  Chloride 96 - 106 mmol/L 101 101 101  CO2 20 - 29 mmol/L 21 23 21   Calcium 8.7 - 10.2 mg/dL 9.9 9.9 9.8  Total Protein 6.0 - 8.5 g/dL 7.8 - 7.7  Total Bilirubin 0.0 - 1.2 mg/dL 0.8 - 0.4  Alkaline Phos 39 - 117 IU/L 92 - 78  AST 0 - 40 IU/L 17 - 15  ALT 0 - 44 IU/L 35 - 34    Lipid Panel     Component Value Date/Time   CHOL 106 04/03/2018 1029   TRIG 83 04/03/2018 1029   HDL 27 (L) 04/03/2018 1029   CHOLHDL 3.9  04/03/2018 1029   CHOLHDL 4.3 12/22/2015 1207   VLDL 11 12/22/2015 1207   LDLCALC 62 04/03/2018 1029    CBC    Component Value Date/Time   WBC 6.6 12/22/2015 1207   RBC 5.88 (H) 12/22/2015 1207   HGB 16.0 12/22/2015 1207   HCT 46.6 12/22/2015 1207   PLT 264 12/22/2015 1207   MCV 79.3 (L) 12/22/2015  1207   [place] 27.2 12/22/2015 1207   MCHC 34.3 12/22/2015 1207   RDW 15.3 (H) 12/22/2015 1207   LYMPHSABS 2,244 12/22/2015 1207   MONOABS 462 12/22/2015 1207   EOSABS 132 12/22/2015 1207   BASOSABS 66 12/22/2015 1207     Assessment & Plan:   1. Essential hypertension, benign Controlled Continue current regimen Counseled on blood pressure goal of less than 130/80, low-sodium, DASH diet, medication compliance, 150 minutes of moderate intensity exercise per week. Discussed medication compliance, adverse effects. - amLODipine (NORVASC) 10 MG tablet; Take 1 tablet (10 mg total) by mouth daily.  Dispense: 90 tablet; Refill: 1 - lisinopril-hydrochlorothiazide (ZESTORETIC) 20-12.5 MG tablet; Take 1 tablet by mouth daily.  Dispense: 90 tablet; Refill: 1  2. Screening for prostate cancer He denies family history of prostate cancer but would like to be screened Advised that screening should be age 40-69 but he would like to have this done. - PSA, total and free  3. Pure hypercholesterolemia Controlled Low-cholesterol diet - atorvastatin (LIPITOR) 40 MG tablet; Take 1 tablet (40 mg total) by mouth daily.  Dispense: 90 tablet; Refill: 1  4. Type 2 diabetes mellitus with diabetic polyneuropathy, with long-term current use of insulin (HCC) Improved with A1c of 7.7 which is above goal of 7 but improved from 8.7 previously and he has been commended on this No regimen change but he will continue to work on lifestyle modifications He also resists regimen change in dose of gabapentin for management of neuropathy and would like to remain on current dose for some time Counseled on Diabetic diet, my  plate method, [id number] minutes of moderate intensity exercise/week Blood sugar logs with fasting goals of 80-120 mg/dl, random of less than 180 and in the event of sugars less than 60 mg/dl or greater than 400 mg/dl encouraged to notify the clinic. Advised on the need for annual eye exams, annual foot exams, Pneumonia vaccine. - Basic Metabolic Panel - Microalbumin/Creatinine Ratio, Urine - Dulaglutide 1.5 MG/0.5ML SOPN; Inject 1.5 mg into the skin once a week.  Dispense: 4 pen; Refill: 6 - empagliflozin (JARDIANCE) 25 MG TABS tablet; Take 25 mg by mouth daily.  Dispense: 90 tablet; Refill: 1 - gabapentin (NEURONTIN) 300 MG capsule; Take 1 capsule (300 mg total) by mouth 3 (three) times daily.  Dispense: 270 capsule; Refill: 1 - glipiZIDE (GLIPIZIDE XL) 10 MG 24 hr tablet; Take 2 tablets (20 mg total) by mouth daily with breakfast.  Dispense: 180 tablet; Refill: 1 - metFORMIN (GLUCOPHAGE) 1000 MG tablet; TAKE ONE TABLET BY MOUTH TWICE A DAY WITH A MEAL  Dispense: 180 tablet; Refill: 1  5. Musculoskeletal back pain Currently absent at this time He declines prescription of spinal Jessica muscle relaxant  6. Screening for colon cancer - Ambulatory referral to Gastroenterology    Meds ordered this encounter  Medications  . amLODipine (NORVASC) 10 MG tablet    Sig: Take 1 tablet (10 mg total) by mouth daily.    Dispense:  90 tablet    Refill:  1  . atorvastatin (LIPITOR) 40 MG tablet    Sig: Take 1 tablet (40 mg total) by mouth daily.    Dispense:  90 tablet    Refill:  1  . Dulaglutide 1.5 MG/0.5ML SOPN    Sig: Inject 1.5 mg into the skin once a week.    Dispense:  4 pen    Refill:  6  . empagliflozin (JARDIANCE) 25 MG TABS tablet    Sig: Take 25  mg by mouth daily.    Dispense:  90 tablet    Refill:  1  . gabapentin (NEURONTIN) 300 MG capsule    Sig: Take 1 capsule (300 mg total) by mouth 3 (three) times daily.    Dispense:  270 capsule    Refill:  1  . glipiZIDE (GLIPIZIDE XL) 10 MG  24 hr tablet    Sig: Take 2 tablets (20 mg total) by mouth daily with breakfast.    Dispense:  180 tablet    Refill:  1  . lisinopril-hydrochlorothiazide (ZESTORETIC) 20-12.5 MG tablet    Sig: Take 1 tablet by mouth daily.    Dispense:  90 tablet    Refill:  1  . metFORMIN (GLUCOPHAGE) 1000 MG tablet    Sig: TAKE ONE TABLET BY MOUTH TWICE A DAY WITH A MEAL    Dispense:  180 tablet    Refill:  1    Follow-up: Return in about 6 months (around 06/16/2019) for Chronic medical conditions.       [name], MD, FAAFP. [facility] [place], [place]   12/16/2018, 10:07 AM

## 2018-12-16 NOTE — Progress Notes (Signed)
Patient is having pain in feet and back.

## 2018-12-17 LAB — MICROALBUMIN / CREATININE URINE RATIO
Creatinine, Urine: 127.8 mg/dL
Microalb/Creat Ratio: 29 mg/g creat (ref 0–29)
Microalbumin, Urine: 37.7 ug/mL

## 2018-12-17 LAB — BASIC METABOLIC PANEL
BUN/Creatinine Ratio: 10 (ref 9–20)
BUN: 11 mg/dL (ref 6–24)
CO2: 19 mmol/L — ABNORMAL LOW (ref 20–29)
Calcium: 9.7 mg/dL (ref 8.7–10.2)
Chloride: 102 mmol/L (ref 96–106)
Creatinine, Ser: 1.06 mg/dL (ref 0.76–1.27)
GFR calc Af Amer: 94 mL/min/{1.73_m2} (ref >59)
GFR calc non Af Amer: 81 mL/min/{1.73_m2} (ref >59)
Glucose: 162 mg/dL — ABNORMAL HIGH (ref 65–99)
Potassium: 4.3 mmol/L (ref 3.5–5.2)
Sodium: 138 mmol/L (ref 134–144)

## 2018-12-17 LAB — PSA, TOTAL AND FREE
PSA, Free Pct: 22.5 %
PSA, Free: 0.09 ng/mL
Prostate Specific Ag, Serum: 0.4 ng/mL (ref 0.0–4.0)

## 2018-12-19 ENCOUNTER — Telehealth: Payer: Self-pay

## 2018-12-19 NOTE — Telephone Encounter (Signed)
Patient name and DOB has been verified Patient was informed of lab results. Patient had no questions.  

## 2018-12-19 NOTE — Telephone Encounter (Signed)
-----   Message from [name], MD sent at 12/18/2018  5:38 PM EDT ----- Labs are stable, prostate test is normal

## 2019-01-22 ENCOUNTER — Encounter: Payer: Self-pay | Admitting: Family Medicine

## 2019-04-29 ENCOUNTER — Ambulatory Visit: Payer: BC Managed Care – PPO | Attending: Family Medicine | Admitting: Family Medicine

## 2019-04-29 ENCOUNTER — Other Ambulatory Visit: Payer: Self-pay

## 2019-04-29 ENCOUNTER — Encounter: Payer: Self-pay | Admitting: Family Medicine

## 2019-04-29 VITALS — BP 141/85 | HR 101 | Temp 97.9°F | Resp 16 | Wt 286.4 lb

## 2019-04-29 DIAGNOSIS — E669 Obesity, unspecified: Secondary | ICD-10-CM

## 2019-04-29 DIAGNOSIS — Z794 Long term (current) use of insulin: Secondary | ICD-10-CM | POA: Diagnosis not present

## 2019-04-29 DIAGNOSIS — I10 Essential (primary) hypertension: Secondary | ICD-10-CM | POA: Diagnosis not present

## 2019-04-29 DIAGNOSIS — E7800 Pure hypercholesterolemia, unspecified: Secondary | ICD-10-CM | POA: Diagnosis not present

## 2019-04-29 DIAGNOSIS — Z1211 Encounter for screening for malignant neoplasm of colon: Secondary | ICD-10-CM

## 2019-04-29 DIAGNOSIS — E1142 Type 2 diabetes mellitus with diabetic polyneuropathy: Secondary | ICD-10-CM

## 2019-04-29 DIAGNOSIS — Z6838 Body mass index (BMI) 38.0-38.9, adult: Secondary | ICD-10-CM

## 2019-04-29 LAB — POCT GLYCOSYLATED HEMOGLOBIN (HGB A1C): HbA1c, POC (controlled diabetic range): 8.4 % — AB (ref 0.0–7.0)

## 2019-04-29 LAB — GLUCOSE, POCT (MANUAL RESULT ENTRY): POC Glucose: 116 mg/dl — AB (ref 70–99)

## 2019-04-29 MED ORDER — LANTUS SOLOSTAR 100 UNIT/ML ~~LOC~~ SOPN: 5.0000 [IU] | PEN_INJECTOR | Freq: Every day | SUBCUTANEOUS | 99 refills | Status: DC

## 2019-04-29 MED ORDER — JARDIANCE 25 MG PO TABS: 25.0000 mg | ORAL_TABLET | Freq: Every day | ORAL | 1 refills | Status: DC

## 2019-04-29 MED ORDER — DULAGLUTIDE 1.5 MG/0.5ML ~~LOC~~ SOAJ: 1.5000 mg | SUBCUTANEOUS | 6 refills | Status: DC

## 2019-04-29 MED ORDER — METFORMIN HCL 1000 MG PO TABS: ORAL_TABLET | ORAL | 1 refills | Status: DC

## 2019-04-29 MED ORDER — GABAPENTIN 300 MG PO CAPS: 600.0000 mg | ORAL_CAPSULE | Freq: Two times a day (BID) | ORAL | 6 refills | Status: DC

## 2019-04-29 MED ORDER — AMLODIPINE BESYLATE 10 MG PO TABS: 10.0000 mg | ORAL_TABLET | Freq: Every day | ORAL | 1 refills | Status: DC

## 2019-04-29 MED ORDER — LISINOPRIL-HYDROCHLOROTHIAZIDE 20-12.5 MG PO TABS: 1.0000 | ORAL_TABLET | Freq: Every day | ORAL | 1 refills | Status: DC

## 2019-04-29 MED ORDER — GLIPIZIDE ER 10 MG PO TB24: 20.0000 mg | ORAL_TABLET | Freq: Every day | ORAL | 1 refills | Status: DC

## 2019-04-29 MED ORDER — ATORVASTATIN CALCIUM 40 MG PO TABS: 40.0000 mg | ORAL_TABLET | Freq: Every day | ORAL | 1 refills | Status: DC

## 2019-04-29 NOTE — Progress Notes (Signed)
Subjective:  Patient ID: Anthony Vincent, male    DOB: [birth year]  Age: 49 y.o. MRN: [id number]  CC: Diabetes and Hypertension   HPI Anthony Vincent  is a 49 year old male with a history of type 2 diabetes mellitus (A1c 8.4), hypertension, hyperlipidemia who presents today for follow-up visit. A1c is 8.4 which is up from 7.7 and he endorses compliance with his medications.  Complains that Neuropahthy is uncontrolled on current dose of gabapentin as he does have dull pains in his right foot and sometimes feels off balance.  He endorses weight gain as he has been working at home due to the ongoing pandemic. Fasting blood sugars range 109-115; no hypoglycemia reported. Yet to see his Ophthalmologist Dr Katy Fitch but plans to schedule an appointment.  He is compliant with his antihypertensive and statin and denies adverse effects from his medications. He has no additional concerns today.  Past Medical History:  Diagnosis Date  . Diabetes mellitus without complication ([place])   . Hyperlipidemia   . Hypertension     No past surgical history on file.  Family History  Problem Relation Age of Onset  . Diabetes Mother   . Diabetes Father     No Known Allergies  Outpatient Medications Prior to Visit  Medication Sig Dispense Refill  . amLODipine (NORVASC) 10 MG tablet Take 1 tablet (10 mg total) by mouth daily. 90 tablet 1  . atorvastatin (LIPITOR) 40 MG tablet Take 1 tablet (40 mg total) by mouth daily. 90 tablet 1  . empagliflozin (JARDIANCE) 25 MG TABS tablet Take 25 mg by mouth daily. 90 tablet 1  . gabapentin (NEURONTIN) 300 MG capsule Take 1 capsule (300 mg total) by mouth 3 (three) times daily. 270 capsule 1  . glipiZIDE (GLIPIZIDE XL) 10 MG 24 hr tablet Take 2 tablets (20 mg total) by mouth daily with breakfast. 180 tablet 1  . lisinopril-hydrochlorothiazide (ZESTORETIC) 20-12.5 MG tablet Take 1 tablet by mouth daily. 90 tablet 1  . metFORMIN (GLUCOPHAGE) 1000 MG tablet TAKE ONE TABLET  BY MOUTH TWICE A DAY WITH A MEAL 180 tablet 1  . sildenafil (VIAGRA) 50 MG tablet Take 1 tablet (50 mg total) by mouth daily as needed for erectile dysfunction. 20 tablet 1  . Dulaglutide 1.5 MG/0.5ML SOPN Inject 1.5 mg into the skin once a week. 4 pen 6  . mupirocin nasal ointment (BACTROBAN) 2 % Place 1 application into the nose 2 (two) times daily. Use one-half of tube in each nostril twice daily for five (5) days. After application, press sides of nose together and gently massage. 10 g 0   No facility-administered medications prior to visit.     ROS Review of Systems  Constitutional: Negative for activity change and appetite change.  HENT: Negative for sinus pressure and sore throat.   Eyes: Negative for visual disturbance.  Respiratory: Negative for cough, chest tightness and shortness of breath.   Cardiovascular: Negative for chest pain and leg swelling.  Gastrointestinal: Negative for abdominal distention, abdominal pain, constipation and diarrhea.  Endocrine: Negative.   Genitourinary: Negative for dysuria.  Musculoskeletal: Negative for joint swelling and myalgias.  Skin: Negative for rash.  Allergic/Immunologic: Negative.   Neurological: Negative for weakness, light-headedness and numbness.  Psychiatric/Behavioral: Negative for dysphoric mood and suicidal ideas.    Objective:  BP (!) 141/85   Pulse (!) 101   Temp 97.9 F (36.6 C)   Resp 16   Wt 286 lb 6.4 oz (129.9 kg)   SpO2 97%  BMI 38.84 kg/m   BP/Weight 04/29/2019 12/16/2018 2/83/6629  Systolic BP 476 546 503  Diastolic BP 85 85 84  Wt. (Lbs) 286.4 291 288.4  BMI 38.84 39.47 39.11      Physical Exam Constitutional:      Appearance: He is well-developed.  Neck:     Vascular: No JVD.  Cardiovascular:     Rate and Rhythm: Normal rate.     Heart sounds: Normal heart sounds. No murmur.  Pulmonary:     Effort: Pulmonary effort is normal.     Breath sounds: Normal breath sounds. No wheezing or rales.    Chest:     Chest wall: No tenderness.  Abdominal:     General: Bowel sounds are normal. There is no distension.     Palpations: Abdomen is soft. There is no mass.     Tenderness: There is no abdominal tenderness.  Musculoskeletal:        General: Normal range of motion.     Right lower leg: No edema.     Left lower leg: No edema.  Neurological:     Mental Status: He is alert and oriented to person, place, and time.  Psychiatric:        Mood and Affect: Mood normal.     CMP Latest Ref Rng & Units 12/16/2018 04/03/2018 01/01/2018  Glucose 65 - 99 mg/dL 162(H) 166(H) 128(H)  BUN 6 - 24 mg/dL 11 11 12   Creatinine 0.76 - 1.27 mg/dL 1.06 1.17 1.16  Sodium 134 - 144 mmol/L 138 142 142  Potassium 3.5 - 5.2 mmol/L 4.3 4.3 4.5  Chloride 96 - 106 mmol/L 102 101 101  CO2 20 - 29 mmol/L 19(L) 21 23  Calcium 8.7 - 10.2 mg/dL 9.7 9.9 9.9  Total Protein 6.0 - 8.5 g/dL - 7.8 -  Total Bilirubin 0.0 - 1.2 mg/dL - 0.8 -  Alkaline Phos 39 - 117 IU/L - 92 -  AST 0 - 40 IU/L - 17 -  ALT 0 - 44 IU/L - 35 -    Lipid Panel     Component Value Date/Time   CHOL 106 04/03/2018 1029   TRIG 83 04/03/2018 1029   HDL 27 (L) 04/03/2018 1029   CHOLHDL 3.9 04/03/2018 1029   CHOLHDL 4.3 12/22/2015 1207   VLDL 11 12/22/2015 1207   LDLCALC 62 04/03/2018 1029    CBC    Component Value Date/Time   WBC 6.6 12/22/2015 1207   RBC 5.88 (H) 12/22/2015 1207   HGB 16.0 12/22/2015 1207   HCT 46.6 12/22/2015 1207   PLT 264 12/22/2015 1207   MCV 79.3 (L) 12/22/2015 1207   MCH 27.2 12/22/2015 1207   MCHC 34.3 12/22/2015 1207   RDW 15.3 (H) 12/22/2015 1207   LYMPHSABS 2,244 12/22/2015 1207   MONOABS 462 12/22/2015 1207   EOSABS 132 12/22/2015 1207   BASOSABS 66 12/22/2015 1207    Lab Results  Component Value Date   HGBA1C 8.4 (A) 04/29/2019    Assessment & Plan:   1. Type 2 diabetes mellitus with diabetic polyneuropathy, with long-term current use of insulin (HCC) Uncontrolled with A1c of 8.4  which has trended up from 7.7 previously Lantus added to regimen Neuropathy is uncontrolled and I have increase gabapentin dose. Counseled on Diabetic diet, my plate method, 546 minutes of moderate intensity exercise/week Blood sugar logs with fasting goals of 80-120 mg/dl, random of less than 180 and in the event of sugars less than 60 mg/dl or greater than  400 mg/dl encouraged to notify the clinic. Advised on the need for annual eye exams, annual foot exams, Pneumonia vaccine. - POCT glucose (manual entry) - POCT glycosylated hemoglobin (Hb A1C) - gabapentin (NEURONTIN) 300 MG capsule; Take 2 capsules (600 mg total) by mouth 2 (two) times daily.  Dispense: 120 capsule; Refill: 6 - insulin glargine (LANTUS SOLOSTAR) 100 UNIT/ML Solostar Pen; Inject 5 Units into the skin daily.  Dispense: 5 pen; Refill: PRN - Lipid panel; Future - CMP14+EGFR; Future - Microalbumin / creatinine urine ratio; Future - Dulaglutide 1.5 MG/0.5ML SOPN; Inject 1.5 mg into the skin once a week.  Dispense: 4 pen; Refill: 6 - empagliflozin (JARDIANCE) 25 MG TABS tablet; Take 25 mg by mouth daily.  Dispense: 90 tablet; Refill: 1 - glipiZIDE (GLIPIZIDE XL) 10 MG 24 hr tablet; Take 2 tablets (20 mg total) by mouth daily with breakfast.  Dispense: 180 tablet; Refill: 1 - metFORMIN (GLUCOPHAGE) 1000 MG tablet; TAKE ONE TABLET BY MOUTH TWICE A DAY WITH A MEAL  Dispense: 180 tablet; Refill: 1  2. Essential hypertension, benign Slightly elevated No regimen change today Counseled on blood pressure goal of less than 130/80, low-sodium, DASH diet, medication compliance, 150 minutes of moderate intensity exercise per week. Discussed medication compliance, adverse effects. - amLODipine (NORVASC) 10 MG tablet; Take 1 tablet (10 mg total) by mouth daily.  Dispense: 90 tablet; Refill: 1 - lisinopril-hydrochlorothiazide (ZESTORETIC) 20-12.5 MG tablet; Take 1 tablet by mouth daily.  Dispense: 90 tablet; Refill: 1  3. Pure  hypercholesterolemia Controlled - atorvastatin (LIPITOR) 40 MG tablet; Take 1 tablet (40 mg total) by mouth daily.  Dispense: 90 tablet; Refill: 1  4. Screening for colon cancer - Ambulatory referral to Gastroenterology  5. Obesity (BMI 35.0-39.9 without comorbidity) Counseled on reducing portion sizes, increasing physical activity     Return in about 6 months (around 10/30/2019) for Chronic disease management.   [name], MD, FAAFP. [facility] and [facility] [place], [place]   04/29/2019, 2:41 PM

## 2019-04-29 NOTE — Progress Notes (Signed)
Pt states he is having issues with his right foot   Pt states he thinks he is having some neuropathy. He gets a little pain time and time   Pt states at times his balance is off. Pt denies dizziness

## 2019-04-30 ENCOUNTER — Ambulatory Visit: Payer: BC Managed Care – PPO | Attending: Family Medicine

## 2019-04-30 DIAGNOSIS — E1142 Type 2 diabetes mellitus with diabetic polyneuropathy: Secondary | ICD-10-CM

## 2019-04-30 DIAGNOSIS — Z794 Long term (current) use of insulin: Secondary | ICD-10-CM

## 2019-05-01 ENCOUNTER — Telehealth: Payer: Self-pay

## 2019-05-01 LAB — CMP14+EGFR
ALT: 32 IU/L (ref 0–44)
AST: 16 IU/L (ref 0–40)
Albumin/Globulin Ratio: 1.8 (ref 1.2–2.2)
Albumin: 4.6 g/dL (ref 4.0–5.0)
Alkaline Phosphatase: 83 IU/L (ref 39–117)
BUN/Creatinine Ratio: 14 (ref 9–20)
BUN: 13 mg/dL (ref 6–24)
Bilirubin Total: 0.9 mg/dL (ref 0.0–1.2)
CO2: 23 mmol/L (ref 20–29)
Calcium: 9.3 mg/dL (ref 8.7–10.2)
Chloride: 101 mmol/L (ref 96–106)
Creatinine, Ser: 0.96 mg/dL (ref 0.76–1.27)
GFR calc Af Amer: 106 mL/min/{1.73_m2} (ref >59)
GFR calc non Af Amer: 92 mL/min/{1.73_m2} (ref >59)
Globulin, Total: 2.5 g/dL (ref 1.5–4.5)
Glucose: 147 mg/dL — ABNORMAL HIGH (ref 65–99)
Potassium: 4.2 mmol/L (ref 3.5–5.2)
Sodium: 141 mmol/L (ref 134–144)
Total Protein: 7.1 g/dL (ref 6.0–8.5)

## 2019-05-01 LAB — MICROALBUMIN / CREATININE URINE RATIO
Creatinine, Urine: 76.1 mg/dL
Microalb/Creat Ratio: 7 mg/g creat (ref 0–29)
Microalbumin, Urine: 5.6 ug/mL

## 2019-05-01 LAB — LIPID PANEL
Chol/HDL Ratio: 4 ratio (ref 0.0–5.0)
Cholesterol, Total: 99 mg/dL — ABNORMAL LOW (ref 100–199)
HDL: 25 mg/dL — ABNORMAL LOW (ref >39)
LDL Chol Calc (NIH): 54 mg/dL (ref 0–99)
Triglycerides: 104 mg/dL (ref 0–149)
VLDL Cholesterol Cal: 20 mg/dL (ref 5–40)

## 2019-05-01 MED ORDER — BASAGLAR KWIKPEN 100 UNIT/ML ~~LOC~~ SOPN: 5.0000 [IU] | PEN_INJECTOR | Freq: Every day | SUBCUTANEOUS | 99 refills | Status: DC

## 2019-05-01 NOTE — Telephone Encounter (Signed)
Patient name and DOB has been verified Patient was informed of lab results. Patient had no questions.  

## 2019-05-01 NOTE — Addendum Note (Signed)
Addended by: Daisy Blossom, Annie Main L on: 05/01/2019 12:14 PM   Modules accepted: Orders

## 2019-05-01 NOTE — Telephone Encounter (Signed)
-----   Message from [name], MD sent at 05/01/2019  2:22 PM EST ----- Please inform the patient that labs are normal. Thank you.

## 2019-05-01 NOTE — Telephone Encounter (Signed)
Switch made.

## 2019-05-01 NOTE — Telephone Encounter (Signed)
Basaglar is preferred under pt's ins-if appropriate can you resend a script to [facility] [employer] from [employer]

## 2019-07-01 ENCOUNTER — Encounter: Payer: Self-pay | Admitting: Family Medicine

## 2019-07-13 ENCOUNTER — Other Ambulatory Visit: Payer: Self-pay | Admitting: Family Medicine

## 2019-07-13 DIAGNOSIS — Z794 Long term (current) use of insulin: Secondary | ICD-10-CM

## 2019-10-08 ENCOUNTER — Other Ambulatory Visit: Payer: Self-pay

## 2019-10-08 ENCOUNTER — Ambulatory Visit: Payer: BC Managed Care – PPO | Attending: Family Medicine | Admitting: Family Medicine

## 2019-10-08 ENCOUNTER — Encounter: Payer: Self-pay | Admitting: Family Medicine

## 2019-10-08 VITALS — BP 137/79 | HR 90 | Ht 72.0 in | Wt 289.0 lb

## 2019-10-08 DIAGNOSIS — E1142 Type 2 diabetes mellitus with diabetic polyneuropathy: Secondary | ICD-10-CM

## 2019-10-08 DIAGNOSIS — I10 Essential (primary) hypertension: Secondary | ICD-10-CM

## 2019-10-08 DIAGNOSIS — Z1159 Encounter for screening for other viral diseases: Secondary | ICD-10-CM

## 2019-10-08 DIAGNOSIS — Z23 Encounter for immunization: Secondary | ICD-10-CM | POA: Diagnosis not present

## 2019-10-08 DIAGNOSIS — E7800 Pure hypercholesterolemia, unspecified: Secondary | ICD-10-CM

## 2019-10-08 DIAGNOSIS — Z794 Long term (current) use of insulin: Secondary | ICD-10-CM | POA: Diagnosis not present

## 2019-10-08 DIAGNOSIS — E119 Type 2 diabetes mellitus without complications: Secondary | ICD-10-CM

## 2019-10-08 DIAGNOSIS — E1165 Type 2 diabetes mellitus with hyperglycemia: Secondary | ICD-10-CM | POA: Diagnosis not present

## 2019-10-08 DIAGNOSIS — N529 Male erectile dysfunction, unspecified: Secondary | ICD-10-CM

## 2019-10-08 LAB — POCT GLYCOSYLATED HEMOGLOBIN (HGB A1C): HbA1c, POC (controlled diabetic range): 8.6 % — AB (ref 0.0–7.0)

## 2019-10-08 LAB — GLUCOSE, POCT (MANUAL RESULT ENTRY): POC Glucose: 178 mg/dl — AB (ref 70–99)

## 2019-10-08 MED ORDER — EMPAGLIFLOZIN 25 MG PO TABS: 25.0000 mg | ORAL_TABLET | Freq: Every day | ORAL | 1 refills | Status: DC

## 2019-10-08 MED ORDER — DULAGLUTIDE 1.5 MG/0.5ML ~~LOC~~ SOAJ: 1.5000 mg | SUBCUTANEOUS | 6 refills | Status: DC

## 2019-10-08 MED ORDER — AMLODIPINE BESYLATE 10 MG PO TABS: 10.0000 mg | ORAL_TABLET | Freq: Every day | ORAL | 1 refills | Status: DC

## 2019-10-08 MED ORDER — METFORMIN HCL 1000 MG PO TABS: ORAL_TABLET | ORAL | 1 refills | Status: DC

## 2019-10-08 MED ORDER — LISINOPRIL-HYDROCHLOROTHIAZIDE 20-12.5 MG PO TABS: 1.0000 | ORAL_TABLET | Freq: Every day | ORAL | 1 refills | Status: DC

## 2019-10-08 MED ORDER — ATORVASTATIN CALCIUM 40 MG PO TABS: 40.0000 mg | ORAL_TABLET | Freq: Every day | ORAL | 1 refills | Status: DC

## 2019-10-08 MED ORDER — GABAPENTIN 300 MG PO CAPS: 600.0000 mg | ORAL_CAPSULE | Freq: Two times a day (BID) | ORAL | 6 refills | Status: DC

## 2019-10-08 MED ORDER — SILDENAFIL CITRATE 50 MG PO TABS: 50.0000 mg | ORAL_TABLET | Freq: Every day | ORAL | 1 refills | Status: DC | PRN

## 2019-10-08 MED ORDER — GLIPIZIDE ER 10 MG PO TB24: 20.0000 mg | ORAL_TABLET | Freq: Every day | ORAL | 1 refills | Status: DC

## 2019-10-08 MED ORDER — BASAGLAR KWIKPEN 100 UNIT/ML ~~LOC~~ SOPN: 10.0000 [IU] | PEN_INJECTOR | Freq: Every day | SUBCUTANEOUS | 3 refills | Status: DC

## 2019-10-08 NOTE — Progress Notes (Signed)
Subjective:  Patient ID: Anthony Vincent, male    DOB: Jun 26, 1970  Age: 49 y.o. MRN: [id number]  CC: Diabetes   HPI Unique Searfoss a43 year old male with a history of type 2 diabetes mellitus (A1c8.6), hypertension, hyperlipidemia who presents today for follow-up visit.  Walking up and down steps causes his L knee some form of discomfort and it clicks when he walks.  He has no pain at rest. Complains of erectile dysfunction and is supposed to be on Viagra but has been out Noticed his penis has been bent and has a hardening still aspect for which he is requesting urology referral.  Due for colonoscopy for which I referred him but he has to call GI for an appointment for Colonoscopy.  Fasting sugars are 140-150 and he thinks ever since starting Basaglar sugars have been elevated.  A1c was 8.4 at last visit and is 8.6.  Denies neuropathy neuropathy in extremities.  Due for an eye exam. Past Medical History:  Diagnosis Date  . Diabetes mellitus without complication ([place])   . Hyperlipidemia   . Hypertension     History reviewed. No pertinent surgical history.  Family History  Problem Relation Age of Onset  . Diabetes Mother   . Diabetes Father     No Known Allergies  Outpatient Medications Prior to Visit  Medication Sig Dispense Refill  . amLODipine (NORVASC) 10 MG tablet Take 1 tablet (10 mg total) by mouth daily. 90 tablet 1  . atorvastatin (LIPITOR) 40 MG tablet Take 1 tablet (40 mg total) by mouth daily. 90 tablet 1  . Dulaglutide 1.5 MG/0.5ML SOPN Inject 1.5 mg into the skin once a week. 4 pen 6  . empagliflozin (JARDIANCE) 25 MG TABS tablet Take 25 mg by mouth daily. 90 tablet 1  . gabapentin (NEURONTIN) 300 MG capsule Take 2 capsules (600 mg total) by mouth 2 (two) times daily. 120 capsule 6  . glipiZIDE (GLIPIZIDE XL) 10 MG 24 hr tablet Take 2 tablets (20 mg total) by mouth daily with breakfast. 180 tablet 1  . Insulin Glargine (BASAGLAR KWIKPEN) 100 UNIT/ML Inject  0.05 mLs (5 Units total) into the skin daily. 15 mL prn  . lisinopril-hydrochlorothiazide (ZESTORETIC) 20-12.5 MG tablet Take 1 tablet by mouth daily. 90 tablet 1  . metFORMIN (GLUCOPHAGE) 1000 MG tablet TAKE ONE TABLET BY MOUTH TWICE A DAY WITH A MEAL 180 tablet 1  . mupirocin nasal ointment (BACTROBAN) 2 % Place 1 application into the nose 2 (two) times daily. Use one-half of tube in each nostril twice daily for five (5) days. After application, press sides of nose together and gently massage. (Patient not taking: Reported on 10/08/2019) 10 g 0  . sildenafil (VIAGRA) 50 MG tablet Take 1 tablet (50 mg total) by mouth daily as needed for erectile dysfunction. (Patient not taking: Reported on 10/08/2019) 20 tablet 1   No facility-administered medications prior to visit.     ROS Review of Systems  Constitutional: Negative for activity change and appetite change.  HENT: Negative for sinus pressure and sore throat.   Eyes: Negative for visual disturbance.  Respiratory: Negative for cough, chest tightness and shortness of breath.   Cardiovascular: Negative for chest pain and leg swelling.  Gastrointestinal: Negative for abdominal distention, abdominal pain, constipation and diarrhea.  Endocrine: Negative.   Genitourinary: Negative for dysuria.  Musculoskeletal:       See HPI  Skin: Negative for rash.  Allergic/Immunologic: Negative.   Neurological: Negative for weakness, light-headedness and numbness.  Psychiatric/Behavioral: Negative for dysphoric mood and suicidal ideas.    Objective:  BP 137/79   Pulse 90   Ht 6' (1.829 m)   Wt 289 lb (131.1 kg)   SpO2 98%   BMI 39.20 kg/m   BP/Weight 10/08/2019 04/29/2019 50/27/7412  Systolic BP 878 676 720  Diastolic BP 79 85 85  Wt. (Lbs) 289 286.4 291  BMI 39.2 38.84 39.47      Physical Exam Constitutional:      Appearance: He is well-developed.  Neck:     Vascular: No JVD.  Cardiovascular:     Rate and Rhythm: Normal rate.      Heart sounds: Normal heart sounds. No murmur heard.   Pulmonary:     Effort: Pulmonary effort is normal.     Breath sounds: Normal breath sounds. No wheezing or rales.  Chest:     Chest wall: No tenderness.  Abdominal:     General: Bowel sounds are normal. There is no distension.     Palpations: Abdomen is soft. There is no mass.     Tenderness: There is no abdominal tenderness.  Musculoskeletal:        General: Normal range of motion.     Right lower leg: No edema.     Left lower leg: No edema.     Comments: Crepitus on range of motion of both knees with no tenderness associated  Neurological:     Mental Status: He is alert and oriented to person, place, and time.  Psychiatric:        Mood and Affect: Mood normal.     CMP Latest Ref Rng & Units 04/30/2019 12/16/2018 04/03/2018  Glucose 65 - 99 mg/dL 147(H) 162(H) 166(H)  BUN 6 - 24 mg/dL 13 11 11   Creatinine 0.76 - 1.27 mg/dL 0.96 1.06 1.17  Sodium 134 - 144 mmol/L 141 138 142  Potassium 3.5 - 5.2 mmol/L 4.2 4.3 4.3  Chloride 96 - 106 mmol/L 101 102 101  CO2 20 - 29 mmol/L 23 19(L) 21  Calcium 8.7 - 10.2 mg/dL 9.3 9.7 9.9  Total Protein 6.0 - 8.5 g/dL 7.1 - 7.8  Total Bilirubin 0.0 - 1.2 mg/dL 0.9 - 0.8  Alkaline Phos 39 - 117 IU/L 83 - 92  AST 0 - 40 IU/L 16 - 17  ALT 0 - 44 IU/L 32 - 35    Lipid Panel     Component Value Date/Time   CHOL 99 (L) 04/30/2019 0843   TRIG 104 04/30/2019 0843   HDL 25 (L) 04/30/2019 0843   CHOLHDL 4.0 04/30/2019 0843   CHOLHDL 4.3 12/22/2015 1207   VLDL 11 12/22/2015 1207   LDLCALC 54 04/30/2019 0843    CBC    Component Value Date/Time   WBC 6.6 12/22/2015 1207   RBC 5.88 (H) 12/22/2015 1207   HGB 16.0 12/22/2015 1207   HCT 46.6 12/22/2015 1207   PLT 264 12/22/2015 1207   MCV 79.3 (L) 12/22/2015 1207   MCH 27.2 12/22/2015 1207   MCHC 34.3 12/22/2015 1207   RDW 15.3 (H) 12/22/2015 1207   LYMPHSABS 2,244 12/22/2015 1207   MONOABS 462 12/22/2015 1207   EOSABS 132 12/22/2015  1207   BASOSABS 66 12/22/2015 1207    Lab Results  Component Value Date   HGBA1C 8.6 (A) 10/08/2019    Assessment & Plan:  1. Type 2 diabetes mellitus with hyperglycemia, with long-term current use of insulin (HCC) Uncontrolled with A1c of 8.6 Increase Basaglar from 5 to  10 units Counseled on Diabetic diet, my plate method, 998 minutes of moderate intensity exercise/week Blood sugar logs with fasting goals of 80-120 mg/dl, random of less than 180 and in the event of sugars less than 60 mg/dl or greater than 400 mg/dl encouraged to notify the clinic. Advised on the need for annual eye exams, annual foot exams, Pneumonia vaccine. - POCT glucose (manual entry) - POCT glycosylated hemoglobin (Hb A1C) - Ambulatory referral to Ophthalmology - Insulin Glargine (BASAGLAR KWIKPEN) 100 UNIT/ML; Inject 0.1 mLs (10 Units total) into the skin daily.  Dispense: 15 mL; Refill: 3  2. Type 2 diabetes mellitus with diabetic polyneuropathy, with long-term current use of insulin (HCC) Stable - metFORMIN (GLUCOPHAGE) 1000 MG tablet; TAKE ONE TABLET BY MOUTH TWICE A DAY WITH A MEAL  Dispense: 180 tablet; Refill: 1 - glipiZIDE (GLIPIZIDE XL) 10 MG 24 hr tablet; Take 2 tablets (20 mg total) by mouth daily with breakfast.  Dispense: 180 tablet; Refill: 1 - gabapentin (NEURONTIN) 300 MG capsule; Take 2 capsules (600 mg total) by mouth 2 (two) times daily.  Dispense: 120 capsule; Refill: 6 - empagliflozin (JARDIANCE) 25 MG TABS tablet; Take 1 tablet (25 mg total) by mouth daily.  Dispense: 90 tablet; Refill: 1 - Dulaglutide 1.5 MG/0.5ML SOPN; Inject 0.5 mLs (1.5 mg total) into the skin once a week.  Dispense: 0.5 mL; Refill: 6  3. Essential hypertension, benign Controlled Counseled on blood pressure goal of less than 130/80, low-sodium, DASH diet, medication compliance, 150 minutes of moderate intensity exercise per week. Discussed medication compliance, adverse effects. - lisinopril-hydrochlorothiazide  (ZESTORETIC) 20-12.5 MG tablet; Take 1 tablet by mouth daily.  Dispense: 90 tablet; Refill: 1 - amLODipine (NORVASC) 10 MG tablet; Take 1 tablet (10 mg total) by mouth daily.  Dispense: 90 tablet; Refill: 1  4. Pure hypercholesterolemia Controlled Low-cholesterol diet - atorvastatin (LIPITOR) 40 MG tablet; Take 1 tablet (40 mg total) by mouth daily.  Dispense: 90 tablet; Refill: 1  5. Erectile dysfunction, unspecified erectile dysfunction type We will refer to urology given complains of penile abnormality - Ambulatory referral to Urology - sildenafil (VIAGRA) 50 MG tablet; Take 1 tablet (50 mg total) by mouth daily as needed for erectile dysfunction.  Dispense: 20 tablet; Refill: 1  6. Need for hepatitis C screening test - HCV RNA quant rflx ultra or genotyp([facility])  7. Need for immunization against influenza - Flu Vaccine QUAD 36+ mos IM   Meds ordered this encounter  Medications  . Insulin Glargine (BASAGLAR KWIKPEN) 100 UNIT/ML    Sig: Inject 0.1 mLs (10 Units total) into the skin daily.    Dispense:  15 mL    Refill:  3    Dose increase  . metFORMIN (GLUCOPHAGE) 1000 MG tablet    Sig: TAKE ONE TABLET BY MOUTH TWICE A DAY WITH A MEAL    Dispense:  180 tablet    Refill:  1  . lisinopril-hydrochlorothiazide (ZESTORETIC) 20-12.5 MG tablet    Sig: Take 1 tablet by mouth daily.    Dispense:  90 tablet    Refill:  1  . glipiZIDE (GLIPIZIDE XL) 10 MG 24 hr tablet    Sig: Take 2 tablets (20 mg total) by mouth daily with breakfast.    Dispense:  180 tablet    Refill:  1  . gabapentin (NEURONTIN) 300 MG capsule    Sig: Take 2 capsules (600 mg total) by mouth 2 (two) times daily.    Dispense:  120 capsule  Refill:  6  . empagliflozin (JARDIANCE) 25 MG TABS tablet    Sig: Take 1 tablet (25 mg total) by mouth daily.    Dispense:  90 tablet    Refill:  1  . Dulaglutide 1.5 MG/0.5ML SOPN    Sig: Inject 0.5 mLs (1.5 mg total) into the skin once a week.    Dispense:   0.5 mL    Refill:  6  . amLODipine (NORVASC) 10 MG tablet    Sig: Take 1 tablet (10 mg total) by mouth daily.    Dispense:  90 tablet    Refill:  1  . atorvastatin (LIPITOR) 40 MG tablet    Sig: Take 1 tablet (40 mg total) by mouth daily.    Dispense:  90 tablet    Refill:  1  . sildenafil (VIAGRA) 50 MG tablet    Sig: Take 1 tablet (50 mg total) by mouth daily as needed for erectile dysfunction.    Dispense:  20 tablet    Refill:  1    Follow-up: Return in about 3 months (around 01/08/2020) for Diabetes.       [name], MD, FAAFP. [facility] and [facility], [place]   10/09/2019, 8:28 AM
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
## 2019-10-08 NOTE — Patient Instructions (Signed)
Influenza Virus Vaccine injection (Fluarix) What is this medicine? INFLUENZA VIRUS VACCINE (in floo EN zuh VAHY ruhs vak SEEN) helps to reduce the risk of getting influenza also known as the flu. This medicine may be used for other purposes; ask your health care provider or pharmacist if you have questions. COMMON BRAND NAME(S): Fluarix, Fluzone What should I tell my health care provider before I take this medicine? They need to know if you have any of these conditions:  bleeding disorder like hemophilia  fever or infection  Guillain-Barre syndrome or other neurological problems  immune system problems  infection with the human immunodeficiency virus (HIV) or AIDS  low blood platelet counts  multiple sclerosis  an unusual or allergic reaction to influenza virus vaccine, eggs, chicken proteins, latex, gentamicin, other medicines, foods, dyes or preservatives  pregnant or trying to get pregnant  breast-feeding How should I use this medicine? This vaccine is for injection into a muscle. It is given by a health care professional. A copy of Vaccine Information Statements will be given before each vaccination. Read this sheet carefully each time. The sheet may change frequently. Talk to your pediatrician regarding the use of this medicine in children. Special care may be needed. Overdosage: If you think you have taken too much of this medicine contact a poison control center or emergency room at once. NOTE: This medicine is only for you. Do not share this medicine with others. What if I miss a dose? This does not apply. What may interact with this medicine?  chemotherapy or radiation therapy  medicines that lower your immune system like etanercept, anakinra, infliximab, and adalimumab  medicines that treat or prevent blood clots like warfarin  phenytoin  steroid medicines like prednisone or cortisone  theophylline  vaccines This list may not describe all possible  interactions. Give your health care provider a list of all the medicines, herbs, non-prescription drugs, or dietary supplements you use. Also tell them if you smoke, drink alcohol, or use illegal drugs. Some items may interact with your medicine. What should I watch for while using this medicine? Report any side effects that do not go away within 3 days to your doctor or health care professional. Call your health care provider if any unusual symptoms occur within 6 weeks of receiving this vaccine. You may still catch the flu, but the illness is not usually as bad. You cannot get the flu from the vaccine. The vaccine will not protect against colds or other illnesses that may cause fever. The vaccine is needed every year. What side effects may I notice from receiving this medicine? Side effects that you should report to your doctor or health care professional as soon as possible:  allergic reactions like skin rash, itching or hives, swelling of the face, lips, or tongue Side effects that usually do not require medical attention (report to your doctor or health care professional if they continue or are bothersome):  fever  headache  muscle aches and pains  pain, tenderness, redness, or swelling at site where injected  weak or tired This list may not describe all possible side effects. Call your doctor for medical advice about side effects. You may report side effects to FDA at 1-800-FDA-1088. Where should I keep my medicine? This vaccine is only given in a clinic, pharmacy, doctor's office, or other health care setting and will not be stored at home. NOTE: This sheet is a summary. It may not cover all possible information. If you have questions   about this medicine, talk to your doctor, pharmacist, or health care provider.  2020 Elsevier/Gold Standard (2007-09-03 09:30:40)  

## 2019-10-09 LAB — HCV RNA QUANT RFLX ULTRA OR GENOTYP: HCV Quant Baseline: NOT DETECTED IU/mL

## 2019-10-16 ENCOUNTER — Telehealth: Payer: Self-pay

## 2019-10-16 NOTE — Telephone Encounter (Signed)
Patient name and DOB has been verified Patient was informed of lab results. Patient had no questions.  

## 2019-10-16 NOTE — Telephone Encounter (Signed)
-----   Message from [name], MD sent at 10/09/2019  6:30 PM EDT ----- Please inform the patient that labs are normal. Thank you.

## 2019-11-10 ENCOUNTER — Telehealth: Payer: Self-pay

## 2019-11-10 ENCOUNTER — Encounter (HOSPITAL_COMMUNITY): Payer: Self-pay

## 2019-11-10 ENCOUNTER — Other Ambulatory Visit: Payer: Self-pay

## 2019-11-10 ENCOUNTER — Ambulatory Visit (HOSPITAL_COMMUNITY)
Admission: EM | Admit: 2019-11-10 | Discharge: 2019-11-10 | Disposition: A | Discharge status: Home / Self Care | Payer: BC Managed Care – PPO | Attending: Internal Medicine | Admitting: Internal Medicine

## 2019-11-10 DIAGNOSIS — L03211 Cellulitis of face: Secondary | ICD-10-CM | POA: Diagnosis not present

## 2019-11-10 MED ORDER — SULFAMETHOXAZOLE-TRIMETHOPRIM 800-160 MG PO TABS: 1.0000 | ORAL_TABLET | Freq: Two times a day (BID) | ORAL | 0 refills | Status: AC

## 2019-11-10 NOTE — Discharge Instructions (Signed)
Please take medication as prescribed If you notice worsening swelling,pain,or purulent discharge -please return to the urgent care for re-evaluation.

## 2019-11-10 NOTE — ED Provider Notes (Signed)
[place]    CSN: [id number] Arrival date & time: 11/10/19  1151      History   Chief Complaint Chief Complaint  Patient presents with  . Facial Swelling    HPI Anthony Vincent is a 49 y.o. male comes to the urgent care with left-sided nasal swelling and erythema which started yesterday.  Patient denies any pain.  No febrile episodes.  No discharge from the swelling.  No trauma to the face.  No headaches or dizziness.  No periorbital tenderness or swelling.  Patient has had cellulitis in the same area in the past. HPI  Past Medical History:  Diagnosis Date  . Diabetes mellitus without complication ([place])   . Hyperlipidemia   . Hypertension     Patient Active Problem List   Diagnosis Date Noted  . Hyperlipidemia 10/01/2017  . Type 2 diabetes mellitus without complication, with long-term current use of insulin ([place]) 03/27/2017  . Colon cancer screening 12/10/2016  . Erectile dysfunction 08/15/2016  . 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 Tinea corporis 09/10/2013  . Essential hypertension, benign 10/01/2012  . Diabetes ([place]) 10/01/2012  . Dyslipidemia 10/01/2012    History reviewed. No pertinent surgical history.     Home Medications    Prior to Admission medications   Medication Sig Start Date End Date Taking? Authorizing Provider  amLODipine (NORVASC) 10 MG tablet Take 1 tablet (10 mg total) by mouth daily. 10/08/19   [name], MD  atorvastatin (LIPITOR) 40 MG tablet Take 1 tablet (40 mg total) by mouth daily. 10/08/19   [name], MD  Dulaglutide 1.5 MG/0.5ML SOPN Inject 0.5 mLs (1.5 mg total) into the skin once a week. 10/08/19   [name], MD  empagliflozin (JARDIANCE) 25 MG TABS tablet Take 1 tablet (25 mg total) by mouth daily. 10/08/19   [name], MD  gabapentin (NEURONTIN) 300 MG capsule Take 2 capsules (600 mg total) by mouth 2 (two) times daily. 10/08/19   [name], MD  glipiZIDE (GLIPIZIDE XL) 10 MG 24 hr tablet Take  2 tablets (20 mg total) by mouth daily with breakfast. 10/08/19   [name], MD  Insulin Glargine (BASAGLAR KWIKPEN) 100 UNIT/ML Inject 0.1 mLs (10 Units total) into the skin daily. 10/08/19   [name], MD  lisinopril-hydrochlorothiazide (ZESTORETIC) 20-12.5 MG tablet Take 1 tablet by mouth daily. 10/08/19   [name], MD  metFORMIN (GLUCOPHAGE) 1000 MG tablet TAKE ONE TABLET BY MOUTH TWICE A DAY WITH A MEAL 10/08/19   [name], MD  sildenafil (VIAGRA) 50 MG tablet Take 1 tablet (50 mg total) by mouth daily as needed for erectile dysfunction. 10/08/19   [name], MD  sulfamethoxazole-trimethoprim (BACTRIM DS) 800-160 MG tablet Take 1 tablet by mouth 2 (two) times daily for 7 days. 11/10/19 11/17/19  LampteyMyrene Galas, MD  mupirocin nasal ointment (BACTROBAN) 2 % Place 1 application into the nose 2 (two) times daily. Use one-half of tube in each nostril twice daily for five (5) days. After application, press sides of nose together and gently massage. Patient not taking: Reported on 10/08/2019 09/11/18 11/10/19  Argentina Donovan, PA-C    Family History Family History  Problem Relation Age of Onset  . Diabetes Mother   . Diabetes Father     Social History Social History   Tobacco Use  . Smoking status: Never Smoker  . Smokeless tobacco: Never Used  Substance Use Topics  . Alcohol use: No  . Drug use: No  Allergies   Patient has no known allergies.   Review of Systems Review of Systems  Respiratory: Negative.   Cardiovascular: Negative.   Skin: Positive for color change. Negative for rash and wound.  Neurological: Negative.      Physical Exam Triage Vital Signs ED Triage Vitals [11/10/19 1245]  Enc Vitals Group     BP 134/85     Pulse Rate 97     Resp 18     Temp 98 F (36.7 C)     Temp Source Oral     SpO2 99 %     Weight 290 lb (131.5 kg)     Height 6' (1.829 m)     Head Circumference      Peak Flow      Pain Score 0     Pain Loc       Pain Edu?      Excl. in [place]?    No data found.  Updated Vital Signs BP 134/85   Pulse 97   Temp 98 F (36.7 C) (Oral)   Resp 18   Ht 6' (1.829 m)   Wt 131.5 kg   SpO2 99%   BMI 39.33 kg/m   Visual Acuity Right Eye Distance:   Left Eye Distance:   Bilateral Distance:    Right Eye Near:   Left Eye Near:    Bilateral Near:     Physical Exam Vitals and nursing note reviewed.  Constitutional:      General: He is not in acute distress.    Appearance: He is not ill-appearing.  HENT:     Head:     Comments: Swelling in the left paranasal area with some erythema.  No fluctuance.    Right Ear: Tympanic membrane normal.     Left Ear: Tympanic membrane normal.     Nose: Nose normal. No congestion or rhinorrhea.     Mouth/Throat:     Mouth: Mucous membranes are moist.  Eyes:     General:        Right eye: No discharge.        Left eye: No discharge.     Extraocular Movements: Extraocular movements intact.     Pupils: Pupils are equal, round, and reactive to light.  Cardiovascular:     Rate and Rhythm: Normal rate and regular rhythm.  Musculoskeletal:     Cervical back: Normal range of motion and neck supple.  Skin:    General: Skin is warm and dry.  Neurological:     Mental Status: He is alert.      UC Treatments / Results  Labs (all labs ordered are listed, but only abnormal results are displayed) Labs Reviewed - No data to display  EKG   Radiology No results found.  Procedures Procedures (including critical care time)  Medications Ordered in UC Medications - No data to display  Initial Impression / Assessment and Plan / UC Course  I have reviewed the triage vital signs and the nursing notes.  Pertinent labs & imaging results that were available during my care of the patient were reviewed by me and considered in my medical decision making (see chart for details).    Facial cellulitis: Bactrim double strength 1 tablet twice daily for 7  days Ibuprofen as needed for pain If you notice any worsening swelling, redness, pain, discharge please return to the urgent care to be reevaluated. Final Clinical Impressions(s) / UC Diagnoses   Final diagnoses:  Facial cellulitis  Discharge Instructions     Please take medication as prescribed If you notice worsening swelling,pain,or purulent discharge -please return to the urgent care for re-evaluation.   ED Prescriptions    Medication Sig Dispense Auth. Provider   sulfamethoxazole-trimethoprim (BACTRIM DS) 800-160 MG tablet Take 1 tablet by mouth 2 (two) times daily for 7 days. 14 tablet Dierre Crevier, Myrene Galas, MD     PDMP not reviewed this encounter.   Chase Picket, MD 11/10/19 [phone]

## 2019-11-10 NOTE — Telephone Encounter (Signed)
Patient came into the office wanting to know if he could get a antibiotic sent into the pharmacy for his face. Says he was seen in the office for this a few times. Sometimes he can just ignore it and fight through it this time he can not and is asking for something to take.   Please call and advise

## 2019-11-10 NOTE — Telephone Encounter (Signed)
Patient went to an Urgent Care today for evaluation.

## 2019-11-10 NOTE — ED Triage Notes (Signed)
Pt c/o left sided facial swelling and pain near nose started yesterday. Pt states he gets a bacterial infection on his face some times. Pt has 1+ swelling on face left side of nose and it's erythematous.

## 2019-12-02 ENCOUNTER — Telehealth: Payer: Self-pay | Admitting: Family Medicine

## 2019-12-02 DIAGNOSIS — I10 Essential (primary) hypertension: Secondary | ICD-10-CM

## 2019-12-02 NOTE — Telephone Encounter (Signed)
Patient called to request pen needles for [employer]. Patient reports he was required to purchase the 7mm pen needles on last prescription by his pharmacy but pharmacy is now requiring an order to purchase the 25mm pen needles. Please write order for suggested pen needles and size, to administer Basaglar insulin kwikpen. Patient only has 2 needles left.

## 2019-12-02 NOTE — Telephone Encounter (Signed)
Pt is calling and needs refills on pin needles for basagler kwikpen . [facility] on [address]

## 2019-12-02 NOTE — Telephone Encounter (Signed)
Pt. Requested Pen needles for giving Basaglar Insulin.  Pen needles are not on active medication record.  Phone call to pt.  Left vm to return call to give information re: size of pen needles he needs.

## 2019-12-03 MED ORDER — TRUEPLUS PEN NEEDLES 32G X 4 MM MISC: 2 refills | Status: DC

## 2019-12-03 NOTE — Addendum Note (Signed)
Addended by: Daisy Blossom, Annie Main L on: 12/03/2019 02:33 PM   Modules accepted: Orders

## 2019-12-03 NOTE — Telephone Encounter (Signed)
Rx sent 

## 2019-12-21 ENCOUNTER — Ambulatory Visit: Payer: BC Managed Care – PPO | Attending: Family Medicine | Admitting: Family Medicine

## 2019-12-21 ENCOUNTER — Encounter: Payer: Self-pay | Admitting: Family Medicine

## 2019-12-21 ENCOUNTER — Other Ambulatory Visit: Payer: Self-pay

## 2019-12-21 VITALS — BP 122/79 | HR 97 | Ht 72.0 in | Wt 290.4 lb

## 2019-12-21 DIAGNOSIS — E1169 Type 2 diabetes mellitus with other specified complication: Secondary | ICD-10-CM

## 2019-12-21 DIAGNOSIS — M62838 Other muscle spasm: Secondary | ICD-10-CM

## 2019-12-21 DIAGNOSIS — Z794 Long term (current) use of insulin: Secondary | ICD-10-CM | POA: Diagnosis not present

## 2019-12-21 DIAGNOSIS — B351 Tinea unguium: Secondary | ICD-10-CM

## 2019-12-21 DIAGNOSIS — E785 Hyperlipidemia, unspecified: Secondary | ICD-10-CM

## 2019-12-21 DIAGNOSIS — E1159 Type 2 diabetes mellitus with other circulatory complications: Secondary | ICD-10-CM

## 2019-12-21 DIAGNOSIS — I152 Hypertension secondary to endocrine disorders: Secondary | ICD-10-CM

## 2019-12-21 DIAGNOSIS — E1165 Type 2 diabetes mellitus with hyperglycemia: Secondary | ICD-10-CM

## 2019-12-21 DIAGNOSIS — Z1159 Encounter for screening for other viral diseases: Secondary | ICD-10-CM

## 2019-12-21 LAB — POCT GLYCOSYLATED HEMOGLOBIN (HGB A1C): HbA1c, POC (controlled diabetic range): 7.9 % — AB (ref 0.0–7.0)

## 2019-12-21 LAB — GLUCOSE, POCT (MANUAL RESULT ENTRY): POC Glucose: 108 mg/dl — AB (ref 70–99)

## 2019-12-21 MED ORDER — BASAGLAR KWIKPEN 100 UNIT/ML ~~LOC~~ SOPN: 12.0000 [IU] | PEN_INJECTOR | Freq: Every day | SUBCUTANEOUS | 6 refills | Status: DC

## 2019-12-21 MED ORDER — METHOCARBAMOL 500 MG PO TABS: 500.0000 mg | ORAL_TABLET | Freq: Four times a day (QID) | ORAL | 1 refills | Status: DC

## 2019-12-21 NOTE — Patient Instructions (Signed)
Fungal Nail Infection A fungal nail infection is a common infection of the toenails or fingernails. This condition affects toenails more often than fingernails. It often affects the great, or big, toes. More than one nail may be infected. The condition can be passed from person to person (is contagious). What are the causes? This condition is caused by a fungus. Several types of fungi can cause the infection. These fungi are common in moist and warm areas. If your hands or feet come into contact with the fungus, it may get into a crack in your fingernail or toenail and cause the infection. What increases the risk? The following factors may make you more likely to develop this condition:  Being male.  Being of older age.  Living with someone who has the fungus.  Walking barefoot in areas where the fungus thrives, such as showers or locker rooms.  Wearing shoes and socks that cause your feet to sweat.  Having a nail injury or a recent nail surgery.  Having certain medical conditions, such as: ? Athlete's foot. ? Diabetes. ? Psoriasis. ? Poor circulation. ? A weak body defense system (immune system). What are the signs or symptoms? Symptoms of this condition include:  A pale spot on the nail.  Thickening of the nail.  A nail that becomes yellow or brown.  A brittle or ragged nail edge.  A crumbling nail.  A nail that has lifted away from the nail bed. How is this diagnosed? This condition is diagnosed with a physical exam. Your health care provider may take a scraping or clipping from your nail to test for the fungus. How is this treated? Treatment is not needed for mild infections. If you have significant nail changes, treatment may include:  Antifungal medicines taken by mouth (orally). You may need to take the medicine for several weeks or several months, and you may not see the results for a long time. These medicines can cause side effects. Ask your health care provider  what problems to watch for.  Antifungal nail polish or nail cream. These may be used along with oral antifungal medicines.  Laser treatment of the nail.  Surgery to remove the nail. This may be needed for the most severe infections. It can take a long time, usually up to a year, for the infection to go away. The infection may also come back. Follow these instructions at home: Medicines  Take or apply over-the-counter and prescription medicines only as told by your health care provider.  Ask your health care provider about using over-the-counter mentholated ointment on your nails. Nail care  Trim your nails often.  Wash and dry your hands and feet every day.  Keep your feet dry: ? Wear absorbent socks, and change your socks frequently. ? Wear shoes that allow air to circulate, such as sandals or canvas tennis shoes. Throw out old shoes.  Do not use artificial nails.  If you go to a nail salon, make sure you choose one that uses clean instruments.  Use antifungal foot powder on your feet and in your shoes. General instructions  Do not share personal items, such as towels or nail clippers.  Do not walk barefoot in shower rooms or locker rooms.  Wear rubber gloves if you are working with your hands in wet areas.  Keep all follow-up visits as told by your health care provider. This is important. Contact a health care provider if: Your infection is not getting better or it is getting worse   after several months. Summary  A fungal nail infection is a common infection of the toenails or fingernails.  Treatment is not needed for mild infections. If you have significant nail changes, treatment may include taking medicine orally and applying medicine to your nails.  It can take a long time, usually up to a year, for the infection to go away. The infection may also come back.  Take or apply over-the-counter and prescription medicines only as told by your health care  provider.  Follow instructions for taking care of your nails to help prevent infection from coming back or spreading. This information is not intended to replace advice given to you by your health care provider. Make sure you discuss any questions you have with your health care provider. Document Revised: 05/29/2018 Document Reviewed: 07/12/2017 Elsevier Patient Education  2020 Elsevier Inc.  

## 2019-12-21 NOTE — Progress Notes (Signed)
Subjective:  Patient ID: Anthony Vincent, male    DOB: [birth year]  Age: 49 y.o. MRN: [id number]  CC: Diabetes   HPI Anthony Vincent is a49 year old male with a history of type 2 diabetes mellitus (A1c7.9), hypertension, hyperlipidemia who presents today for follow-up visit.  He complains of L leg pain x 4 days starting from L thigh down his leg worse with sitting, described as a strain rated as a 3-4/10 and is uncomfortable. Denies recent strenous activity.  Fasting sugars are 100-130 and his A1c is 7.9 down from 8.6 previously.  Lantus has been increased from 5 units to 12 units at his last visit and he denies hypoglycemia, numbness in extremities or visual concerns. Compliant with his statin and antihypertensive.  Past Medical History:  Diagnosis Date   Diabetes mellitus without complication ([place])    Hyperlipidemia    Hypertension     History reviewed. No pertinent surgical history.  Family History  Problem Relation Age of Onset   Diabetes Mother    Diabetes Father     No Known Allergies  Outpatient Medications Prior to Visit  Medication Sig Dispense Refill   amLODipine (NORVASC) 10 MG tablet Take 1 tablet (10 mg total) by mouth daily. 90 tablet 1   atorvastatin (LIPITOR) 40 MG tablet Take 1 tablet (40 mg total) by mouth daily. 90 tablet 1   Dulaglutide 1.5 MG/0.5ML SOPN Inject 0.5 mLs (1.5 mg total) into the skin once a week. 0.5 mL 6   empagliflozin (JARDIANCE) 25 MG TABS tablet Take 1 tablet (25 mg total) by mouth daily. 90 tablet 1   gabapentin (NEURONTIN) 300 MG capsule Take 2 capsules (600 mg total) by mouth 2 (two) times daily. 120 capsule 6   glipiZIDE (GLIPIZIDE XL) 10 MG 24 hr tablet Take 2 tablets (20 mg total) by mouth daily with breakfast. 180 tablet 1   Insulin Pen Needle (TRUEPLUS PEN NEEDLES) 32G X 4 MM MISC Use to inject Basaglar once daily. 100 each 2   lisinopril-hydrochlorothiazide (ZESTORETIC) 20-12.5 MG tablet Take 1 tablet by mouth  daily. 90 tablet 1   metFORMIN (GLUCOPHAGE) 1000 MG tablet TAKE ONE TABLET BY MOUTH TWICE A DAY WITH A MEAL 180 tablet 1   sildenafil (VIAGRA) 50 MG tablet Take 1 tablet (50 mg total) by mouth daily as needed for erectile dysfunction. 20 tablet 1   Insulin Glargine (BASAGLAR KWIKPEN) 100 UNIT/ML Inject 0.1 mLs (10 Units total) into the skin daily. 15 mL 3   No facility-administered medications prior to visit.     ROS Review of Systems  Constitutional: Negative for activity change and appetite change.  HENT: Negative for sinus pressure and sore throat.   Eyes: Negative for visual disturbance.  Respiratory: Negative for cough, chest tightness and shortness of breath.   Cardiovascular: Negative for chest pain and leg swelling.  Gastrointestinal: Negative for abdominal distention, abdominal pain, constipation and diarrhea.  Endocrine: Negative.   Genitourinary: Negative for dysuria.  Musculoskeletal:       See HPI  Skin: Negative for rash.  Allergic/Immunologic: Negative.   Neurological: Negative for weakness, light-headedness and numbness.  Psychiatric/Behavioral: Negative for dysphoric mood and suicidal ideas.    Objective:  BP 122/79    Pulse 97    Ht 6' (1.829 m)    Wt 290 lb 6.4 oz (131.7 kg)    SpO2 97%    BMI 39.39 kg/m   BP/Weight 12/21/2019 11/10/2019 8/32/5498  Systolic BP 264 158 309  Diastolic BP 79 85  79  Wt. (Lbs) 290.4 290 289  BMI 39.39 39.33 39.2      Physical Exam Constitutional:      Appearance: He is well-developed.  Neck:     Vascular: No JVD.  Cardiovascular:     Rate and Rhythm: Normal rate.     Heart sounds: Normal heart sounds. No murmur heard.   Pulmonary:     Effort: Pulmonary effort is normal.     Breath sounds: Normal breath sounds. No wheezing or rales.  Chest:     Chest wall: No tenderness.  Abdominal:     General: Bowel sounds are normal. There is no distension.     Palpations: Abdomen is soft. There is no mass.     Tenderness:  There is no abdominal tenderness.  Musculoskeletal:        General: No tenderness. Normal range of motion.     Right lower leg: No edema.     Left lower leg: No edema.     Comments: No tenderness on palpation of entire left extremity and no tenderness on range of motion  Neurological:     Mental Status: He is alert and oriented to person, place, and time.     Coordination: Coordination normal.     Gait: Gait normal.  Psychiatric:        Mood and Affect: Mood normal.     CMP Latest Ref Rng & Units 04/30/2019 12/16/2018 04/03/2018  Glucose 65 - 99 mg/dL 147(H) 162(H) 166(H)  BUN 6 - 24 mg/dL 13 11 11   Creatinine 0.76 - 1.27 mg/dL 0.96 1.06 1.17  Sodium 134 - 144 mmol/L 141 138 142  Potassium 3.5 - 5.2 mmol/L 4.2 4.3 4.3  Chloride 96 - 106 mmol/L 101 102 101  CO2 20 - 29 mmol/L 23 19(L) 21  Calcium 8.7 - 10.2 mg/dL 9.3 9.7 9.9  Total Protein 6.0 - 8.5 g/dL 7.1 - 7.8  Total Bilirubin 0.0 - 1.2 mg/dL 0.9 - 0.8  Alkaline Phos 39 - 117 IU/L 83 - 92  AST 0 - 40 IU/L 16 - 17  ALT 0 - 44 IU/L 32 - 35    Lipid Panel     Component Value Date/Time   CHOL 99 (L) 04/30/2019 0843   TRIG 104 04/30/2019 0843   HDL 25 (L) 04/30/2019 0843   CHOLHDL 4.0 04/30/2019 0843   CHOLHDL 4.3 12/22/2015 1207   VLDL 11 12/22/2015 1207   LDLCALC 54 04/30/2019 0843    CBC    Component Value Date/Time   WBC 6.6 12/22/2015 1207   RBC 5.88 (H) 12/22/2015 1207   HGB 16.0 12/22/2015 1207   HCT 46.6 12/22/2015 1207   PLT 264 12/22/2015 1207   MCV 79.3 (L) 12/22/2015 1207   MCH 27.2 12/22/2015 1207   MCHC 34.3 12/22/2015 1207   RDW 15.3 (H) 12/22/2015 1207   LYMPHSABS 2,244 12/22/2015 1207   MONOABS 462 12/22/2015 1207   EOSABS 132 12/22/2015 1207   BASOSABS 66 12/22/2015 1207    Lab Results  Component Value Date   HGBA1C 7.9 (A) 12/21/2019    Assessment & Plan:  1. Type 2 diabetes mellitus with hyperglycemia, with long-term current use of insulin (HCC) Suboptimal control with A1c of 7.9;  goal is less than 7.0 Increase Lantus by 2 units Continue Glipizide, Jardiance, Trulicity, Metformin Counseled on Diabetic diet, my plate method, 749 minutes of moderate intensity exercise/week Blood sugar logs with fasting goals of 80-120 mg/dl, random of less than 180 and  in the event of sugars less than 60 mg/dl or greater than 400 mg/dl encouraged to notify the clinic. Advised on the need for annual eye exams, annual foot exams, Pneumonia vaccine. - POCT glucose (manual entry) - POCT glycosylated hemoglobin (Hb D6U) - Basic Metabolic Panel - Insulin Glargine (BASAGLAR KWIKPEN) 100 UNIT/ML; Inject 12 Units into the skin daily.  Dispense: 30 mL; Refill: 6  2. Muscle spasm Unknown etiology We will check electrolytes to exclude electrolyte abnormality Placed on muscle relaxant - methocarbamol (ROBAXIN) 500 MG tablet; Take 1 tablet (500 mg total) by mouth 4 (four) times daily.  Dispense: 60 tablet; Refill: 1  3. Hypertension associated with diabetes ([place]) Controlled Continue antihypertensive Counseled on blood pressure goal of less than 130/80, low-sodium, DASH diet, medication compliance, 150 minutes of moderate intensity exercise per week. Discussed medication compliance, adverse effects.  4. Hyperlipidemia associated with type 2 diabetes mellitus ([place]) Controlled Continue statin Low-cholesterol diet  5. Onychomycosis Last seen by podiatry last year He will need fungal nail culture and treatment Advised to schedule appointment with his podiatrist  6. Need for hepatitis C screening test - HCV RNA quant rflx ultra or genotyp([facility])    Meds ordered this encounter  Medications   methocarbamol (ROBAXIN) 500 MG tablet    Sig: Take 1 tablet (500 mg total) by mouth 4 (four) times daily.    Dispense:  60 tablet    Refill:  1   Insulin Glargine (BASAGLAR KWIKPEN) 100 UNIT/ML    Sig: Inject 12 Units into the skin daily.    Dispense:  30 mL    Refill:  6    Dose  increase    Follow-up: Return in about 3 months (around 03/22/2020) for chronic disease management.       [name], MD, FAAFP. [facility] [place], [place]   12/21/2019, 9:20 AM

## 2019-12-21 NOTE — Progress Notes (Signed)
Having pain in left leg. Pain started on Thursday.

## 2019-12-23 LAB — BASIC METABOLIC PANEL
BUN/Creatinine Ratio: 9 (ref 9–20)
BUN: 9 mg/dL (ref 6–24)
CO2: 21 mmol/L (ref 20–29)
Calcium: 9.3 mg/dL (ref 8.7–10.2)
Chloride: 102 mmol/L (ref 96–106)
Creatinine, Ser: 1.01 mg/dL (ref 0.76–1.27)
GFR calc Af Amer: 99 mL/min/{1.73_m2} (ref >59)
GFR calc non Af Amer: 86 mL/min/{1.73_m2} (ref >59)
Glucose: 95 mg/dL (ref 65–99)
Potassium: 4 mmol/L (ref 3.5–5.2)
Sodium: 141 mmol/L (ref 134–144)

## 2019-12-23 LAB — HCV RNA QUANT RFLX ULTRA OR GENOTYP: HCV Quant Baseline: NOT DETECTED IU/mL

## 2019-12-25 ENCOUNTER — Telehealth: Payer: Self-pay

## 2019-12-25 DIAGNOSIS — M62838 Other muscle spasm: Secondary | ICD-10-CM

## 2019-12-25 NOTE — Telephone Encounter (Signed)
Pt called back requesting to speak with a nurse about left leg pain [phone]

## 2019-12-25 NOTE — Telephone Encounter (Signed)
Patient was called and he states that the muscle relaxer is not working and he would like to try something else.

## 2019-12-25 NOTE — Telephone Encounter (Signed)
Patient was called and left a voicemail informing him of normal lab results.

## 2019-12-25 NOTE — Telephone Encounter (Signed)
-----   Message from [name], MD sent at 12/23/2019  2:04 PM EDT ----- Please inform the patient that labs are normal. Thank you.

## 2019-12-28 MED ORDER — METHOCARBAMOL 500 MG PO TABS: 1000.0000 mg | ORAL_TABLET | Freq: Three times a day (TID) | ORAL | 1 refills | Status: DC | PRN

## 2019-12-28 NOTE — Telephone Encounter (Signed)
I have sent an increased dose of Robaxin to his pharmacy

## 2019-12-28 NOTE — Telephone Encounter (Signed)
Patient was called and a voicemail was left informing patient that medication has been changed and sent to pharmacy.

## 2020-02-03 LAB — HM DIABETES EYE EXAM

## 2020-03-22 ENCOUNTER — Ambulatory Visit: Payer: BC Managed Care – PPO | Attending: Family Medicine | Admitting: Family Medicine

## 2020-03-22 ENCOUNTER — Other Ambulatory Visit: Payer: Self-pay

## 2020-03-22 ENCOUNTER — Encounter: Payer: Self-pay | Admitting: Family Medicine

## 2020-03-22 VITALS — BP 112/75 | HR 99 | Ht 72.0 in | Wt 290.0 lb

## 2020-03-22 DIAGNOSIS — E1142 Type 2 diabetes mellitus with diabetic polyneuropathy: Secondary | ICD-10-CM

## 2020-03-22 DIAGNOSIS — Z794 Long term (current) use of insulin: Secondary | ICD-10-CM

## 2020-03-22 DIAGNOSIS — E785 Hyperlipidemia, unspecified: Secondary | ICD-10-CM

## 2020-03-22 DIAGNOSIS — I152 Hypertension secondary to endocrine disorders: Secondary | ICD-10-CM

## 2020-03-22 DIAGNOSIS — M19019 Primary osteoarthritis, unspecified shoulder: Secondary | ICD-10-CM

## 2020-03-22 DIAGNOSIS — Z1211 Encounter for screening for malignant neoplasm of colon: Secondary | ICD-10-CM

## 2020-03-22 DIAGNOSIS — E1159 Type 2 diabetes mellitus with other circulatory complications: Secondary | ICD-10-CM

## 2020-03-22 DIAGNOSIS — E1165 Type 2 diabetes mellitus with hyperglycemia: Secondary | ICD-10-CM | POA: Diagnosis not present

## 2020-03-22 DIAGNOSIS — E1169 Type 2 diabetes mellitus with other specified complication: Secondary | ICD-10-CM

## 2020-03-22 DIAGNOSIS — B353 Tinea pedis: Secondary | ICD-10-CM

## 2020-03-22 LAB — POCT GLYCOSYLATED HEMOGLOBIN (HGB A1C): HbA1c, POC (controlled diabetic range): 8.6 % — AB (ref 0.0–7.0)

## 2020-03-22 LAB — GLUCOSE, POCT (MANUAL RESULT ENTRY): POC Glucose: 177 mg/dl — AB (ref 70–99)

## 2020-03-22 MED ORDER — TERBINAFINE HCL 1 % EX CREA: 1.0000 "application " | TOPICAL_CREAM | Freq: Two times a day (BID) | CUTANEOUS | 0 refills | Status: DC

## 2020-03-22 MED ORDER — LISINOPRIL-HYDROCHLOROTHIAZIDE 20-12.5 MG PO TABS: 1.0000 | ORAL_TABLET | Freq: Every day | ORAL | 1 refills | Status: DC

## 2020-03-22 MED ORDER — DULAGLUTIDE 1.5 MG/0.5ML ~~LOC~~ SOAJ: 1.5000 mg | SUBCUTANEOUS | 6 refills | Status: DC

## 2020-03-22 MED ORDER — TRUEPLUS PEN NEEDLES 32G X 4 MM MISC: 11 refills | Status: DC

## 2020-03-22 MED ORDER — GABAPENTIN 300 MG PO CAPS
ORAL_CAPSULE | ORAL | 6 refills | Status: DC
Start: 2020-03-22 — End: 2020-09-01

## 2020-03-22 MED ORDER — ATORVASTATIN CALCIUM 40 MG PO TABS: 40.0000 mg | ORAL_TABLET | Freq: Every day | ORAL | 1 refills | Status: DC

## 2020-03-22 MED ORDER — EMPAGLIFLOZIN 25 MG PO TABS: 25.0000 mg | ORAL_TABLET | Freq: Every day | ORAL | 1 refills | Status: DC

## 2020-03-22 MED ORDER — AMLODIPINE BESYLATE 10 MG PO TABS: 10.0000 mg | ORAL_TABLET | Freq: Every day | ORAL | 1 refills | Status: DC

## 2020-03-22 MED ORDER — METFORMIN HCL 1000 MG PO TABS: ORAL_TABLET | ORAL | 1 refills | Status: DC

## 2020-03-22 MED ORDER — BASAGLAR KWIKPEN 100 UNIT/ML ~~LOC~~ SOPN: 17.0000 [IU] | PEN_INJECTOR | Freq: Every day | SUBCUTANEOUS | 6 refills | Status: DC

## 2020-03-22 NOTE — Progress Notes (Signed)
Having minor right shoulder pain. Numbness in fingertips.

## 2020-03-22 NOTE — Patient Instructions (Signed)
Diabetes Mellitus and Exercise Exercising regularly is important for overall health, especially for people who have diabetes mellitus. Exercising is not only about losing weight. It has many other health benefits, such as increasing muscle strength and bone density and reducing body fat and stress. This leads to improved fitness, flexibility, and endurance, all of which result in better overall health. What are the benefits of exercise if I have diabetes? Exercise has many benefits for people with diabetes. They include:  Helping to lower and control blood sugar (glucose).  Helping the body to respond better to the hormone insulin by improving insulin sensitivity.  Reducing how much insulin the body needs.  Lowering the risk for heart disease by: ? Lowering "bad" cholesterol and triglyceride levels. ? Increasing "good" cholesterol levels. ? Lowering blood pressure. ? Lowering blood glucose levels. What is my activity plan? Your health care provider or certified diabetes educator can help you make a plan for the type and frequency of exercise that works for you. This is called your activity plan. Be sure to:  Get at least 150 minutes of medium-intensity or high-intensity exercise each week. Exercises may include brisk walking, biking, or water aerobics.  Do stretching and strengthening exercises, such as yoga or weight lifting, at least 2 times a week.  Spread out your activity over at least 3 days of the week.  Get some form of physical activity each day. ? Do not go more than 2 days in a row without some kind of physical activity. ? Avoid being inactive for more than 90 minutes at a time. Take frequent breaks to walk or stretch.  Choose exercises or activities that you enjoy. Set realistic goals.  Start slowly and gradually increase your exercise intensity over time.   How do I manage my diabetes during exercise? Monitor your blood glucose  Check your blood glucose before and  after exercising. If your blood glucose is: ? 240 mg/dL (13.3 mmol/L) or higher before you exercise, check your urine for ketones. These are chemicals created by the liver. If you have ketones in your urine, do not exercise until your blood glucose returns to normal. ? 100 mg/dL (5.6 mmol/L) or lower, eat a snack containing 15-20 grams of carbohydrate. Check your blood glucose 15 minutes after the snack to make sure that your glucose level is above 100 mg/dL (5.6 mmol/L) before you start your exercise.  Know the symptoms of low blood glucose (hypoglycemia) and how to treat it. Your risk for hypoglycemia increases during and after exercise. Follow these tips and your health care provider's instructions  Keep a carbohydrate snack that is fast-acting for use before, during, and after exercise to help prevent or treat hypoglycemia.  Avoid injecting insulin into areas of the body that are going to be exercised. For example, avoid injecting insulin into: ? Your arms, when you are about to play tennis. ? Your legs, when you are about to go jogging.  Keep records of your exercise habits. Doing this can help you and your health care provider adjust your diabetes management plan as needed. Write down: ? Food that you eat before and after you exercise. ? Blood glucose levels before and after you exercise. ? The type and amount of exercise you have done.  Work with your health care provider when you start a new exercise or activity. He or she may need to: ? Make sure that the activity is safe for you. ? Adjust your insulin, other medicines, and food that   you eat.  Drink plenty of water while you exercise. This prevents loss of water (dehydration) and problems caused by a lot of heat in the body (heat stroke).   Where to find more information  American Diabetes Association: www.diabetes.org Summary  Exercising regularly is important for overall health, especially for people who have diabetes  mellitus.  Exercising has many health benefits. It increases muscle strength and bone density and reduces body fat and stress. It also lowers and controls blood glucose.  Your health care provider or certified diabetes educator can help you make an activity plan for the type and frequency of exercise that works for you.  Work with your health care provider to make sure any new activity is safe for you. Also work with your health care provider to adjust your insulin, other medicines, and the food you eat. This information is not intended to replace advice given to you by your health care provider. Make sure you discuss any questions you have with your health care provider. Document Revised: 11/03/2018 Document Reviewed: 11/03/2018 Elsevier Patient Education  2021 Elsevier Inc.  

## 2020-03-22 NOTE — Progress Notes (Signed)
Subjective:  Patient ID: Anthony Vincent, male    DOB: 08/05/1970  Age: 50 y.o. MRN: [id number]  CC: Diabetes   HPI Anthony Vincent  is a50 year old male with a history of type 2 diabetes mellitus (A1c8.6), hypertension, hyperlipidemia who presents today for follow-up visit. Today he complains of tingling in his fingertips and is currently on Gabapentin. He felt these symptoms prior to Gabapentin initiation and symptoms resolved after gabapentin but now it has returned but is not as severe.  Also complains of right shoulder pain anteriorly when he reaches out or tries to go across his body. At rest pain is absent.  Fasting sugars have been 130-200. He is now less active which has caused his A1c to go up from 7.9-8.6 he thinks.  He has been compliant with his current diabetic regimen and has also been compliant with his statin.  Denies presence of visual symptoms.  Past Medical History:  Diagnosis Date  . Diabetes mellitus without complication ([place])   . Hyperlipidemia   . Hypertension     No past surgical history on file.  Family History  Problem Relation Age of Onset  . Diabetes Mother   . Diabetes Father     No Known Allergies  Outpatient Medications Prior to Visit  Medication Sig Dispense Refill  . glipiZIDE (GLIPIZIDE XL) 10 MG 24 hr tablet Take 2 tablets (20 mg total) by mouth daily with breakfast. 180 tablet 1  . methocarbamol (ROBAXIN) 500 MG tablet Take 2 tablets (1,000 mg total) by mouth every 8 (eight) hours as needed for muscle spasms. 60 tablet 1  . sildenafil (VIAGRA) 50 MG tablet Take 1 tablet (50 mg total) by mouth daily as needed for erectile dysfunction. 20 tablet 1  . amLODipine (NORVASC) 10 MG tablet Take 1 tablet (10 mg total) by mouth daily. 90 tablet 1  . atorvastatin (LIPITOR) 40 MG tablet Take 1 tablet (40 mg total) by mouth daily. 90 tablet 1  . Dulaglutide 1.5 MG/0.5ML SOPN Inject 0.5 mLs (1.5 mg total) into the skin once a week. 0.5 mL 6  .  empagliflozin (JARDIANCE) 25 MG TABS tablet Take 1 tablet (25 mg total) by mouth daily. 90 tablet 1  . gabapentin (NEURONTIN) 300 MG capsule Take 2 capsules (600 mg total) by mouth 2 (two) times daily. 120 capsule 6  . Insulin Glargine (BASAGLAR KWIKPEN) 100 UNIT/ML Inject 12 Units into the skin daily. 30 mL 6  . Insulin Pen Needle (TRUEPLUS PEN NEEDLES) 32G X 4 MM MISC Use to inject Basaglar once daily. 100 each 2  . lisinopril-hydrochlorothiazide (ZESTORETIC) 20-12.5 MG tablet Take 1 tablet by mouth daily. 90 tablet 1  . metFORMIN (GLUCOPHAGE) 1000 MG tablet TAKE ONE TABLET BY MOUTH TWICE A DAY WITH A MEAL 180 tablet 1   No facility-administered medications prior to visit.     ROS Review of Systems  Constitutional: Negative for activity change and appetite change.  HENT: Negative for sinus pressure and sore throat.   Eyes: Negative for visual disturbance.  Respiratory: Negative for cough, chest tightness and shortness of breath.   Cardiovascular: Negative for chest pain and leg swelling.  Gastrointestinal: Negative for abdominal distention, abdominal pain, constipation and diarrhea.  Endocrine: Negative.   Genitourinary: Negative for dysuria.  Musculoskeletal:       Right shoulder pain  Skin: Negative for rash.  Allergic/Immunologic: Negative.   Neurological: Positive for numbness. Negative for weakness and light-headedness.  Psychiatric/Behavioral: Negative for dysphoric mood and suicidal ideas.  Objective:  BP 112/75   Pulse 99   Ht 6' (1.829 m)   Wt 290 lb (131.5 kg)   SpO2 98%   BMI 39.33 kg/m   BP/Weight 03/22/2020 12/21/2019 9/47/6546  Systolic BP 503 546 568  Diastolic BP 75 79 85  Wt. (Lbs) 290 290.4 290  BMI 39.33 39.39 39.33      Physical Exam Constitutional:      Appearance: He is well-developed.  Neck:     Vascular: No JVD.  Cardiovascular:     Rate and Rhythm: Normal rate.     Heart sounds: Normal heart sounds. No murmur heard.   Pulmonary:      Effort: Pulmonary effort is normal.     Breath sounds: Normal breath sounds. No wheezing or rales.  Chest:     Chest wall: No tenderness.  Abdominal:     General: Bowel sounds are normal. There is no distension.     Palpations: Abdomen is soft. There is no mass.     Tenderness: There is no abdominal tenderness.  Musculoskeletal:        General: Normal range of motion.     Right lower leg: No edema.     Left lower leg: No edema.     Comments: Right shoulder with anterior shoulder tenderness on forward elevation which is limited to 150 degrees. Positive cross body adduction test Left shoulder is normal  Neurological:     Mental Status: He is alert and oriented to person, place, and time.  Psychiatric:        Mood and Affect: Mood normal.    Diabetic Foot Exam - Simple   Simple Foot Form Diabetic Foot exam was performed with the following findings: Yes 03/22/2020  9:06 AM  Visual Inspection See comments: Yes Sensation Testing Intact to touch and monofilament testing bilaterally: Yes Pulse Check Posterior Tibialis and Dorsalis pulse intact bilaterally: Yes Comments Thick dystrophic right big toenail.  Tinea pedis in webspaces of both feet.  No ulcerations or skin breakdown      CMP Latest Ref Rng & Units 12/21/2019 04/30/2019 12/16/2018  Glucose 65 - 99 mg/dL 95 147(H) 162(H)  BUN 6 - 24 mg/dL _0 Creatinine 0.76 - 1.27 mg/dL 1.01 0.96 1.06  Sodium 134 - 144 mmol/L 141 141 138  Potassium 3.5 - 5.2 mmol/L 4.0 4.2 4.3  Chloride 96 - 106 mmol/L 102 101 102  CO2 20 - 29 mmol/L 21 23 19(L)  Calcium 8.7 - 10.2 mg/dL 9.3 9.3 9.7  Total Protein 6.0 - 8.5 g/dL - 7.1 -  Total Bilirubin 0.0 - 1.2 mg/dL - 0.9 -  Alkaline Phos 39 - 117 IU/L - 83 -  AST 0 - 40 IU/L - 16 -  ALT 0 - 44 IU/L - 32 -    Lipid Panel     Component Value Date/Time   CHOL 99 (L) 04/30/2019 0843   TRIG 104 04/30/2019 0843   HDL 25 (L) 04/30/2019 0843   CHOLHDL 4.0 04/30/2019 0843   CHOLHDL 4.3  12/22/2015 1207   VLDL 11 12/22/2015 1207   LDLCALC 54 04/30/2019 0843    CBC    Component Value Date/Time   WBC 6.6 12/22/2015 1207   RBC 5.88 (H) 12/22/2015 1207   HGB 16.0 12/22/2015 1207   HCT 46.6 12/22/2015 1207   PLT 264 12/22/2015 1207   MCV 79.3 (L) 12/22/2015 1207   MCH 27.2 12/22/2015 1207   MCHC 34.3 12/22/2015 1207  RDW 15.3 (H) 12/22/2015 1207   LYMPHSABS 2,244 12/22/2015 1207   MONOABS 462 12/22/2015 1207   EOSABS 132 12/22/2015 1207   BASOSABS 66 12/22/2015 1207    Lab Results  Component Value Date   HGBA1C 8.6 (A) 03/22/2020    Assessment & Plan:  1. Type 2 diabetes mellitus with hyperglycemia, with long-term current use of insulin (HCC) Uncontrolled with A1c of 8.6 which has trended up from 7.9 Increase Lantus dose Counseled on Diabetic diet, my plate method, 573 minutes of moderate intensity exercise/week Blood sugar logs with fasting goals of 80-120 mg/dl, random of less than 180 and in the event of sugars less than 60 mg/dl or greater than 400 mg/dl encouraged to notify the clinic. Advised on the need for annual eye exams, annual foot exams, Pneumonia vaccine. - POCT glucose (manual entry) - POCT glycosylated hemoglobin (Hb A1C) - Insulin Pen Needle (TRUEPLUS PEN NEEDLES) 32G X 4 MM MISC; Use to inject Basaglar once daily.  Dispense: 100 each; Refill: 11 - CMP14+EGFR - Lipid panel - Microalbumin / creatinine urine ratio - Insulin Glargine (BASAGLAR KWIKPEN) 100 UNIT/ML; Inject 17 Units into the skin at bedtime.  Dispense: 30 mL; Refill: 6  2. Hypertension associated with diabetes ([place]) Controlled Counseled on blood pressure goal of less than 130/80, low-sodium, DASH diet, medication compliance, 150 minutes of moderate intensity exercise per week. Discussed medication compliance, adverse effects. - amLODipine (NORVASC) 10 MG tablet; Take 1 tablet (10 mg total) by mouth daily.  Dispense: 90 tablet; Refill: 1 - lisinopril-hydrochlorothiazide  (ZESTORETIC) 20-12.5 MG tablet; Take 1 tablet by mouth daily.  Dispense: 90 tablet; Refill: 1  3. Hyperlipidemia associated with type 2 diabetes mellitus (HCC) Controlled Low-cholesterol diet - atorvastatin (LIPITOR) 40 MG tablet; Take 1 tablet (40 mg total) by mouth daily.  Dispense: 90 tablet; Refill: 1  4. Type 2 diabetes mellitus with diabetic polyneuropathy, with long-term current use of insulin (HCC) Uncontrolled We will increase his evening dose of gabapentin but morning dose of gabapentin will remain the same to prevent sedation during the day - Dulaglutide 1.5 MG/0.5ML SOPN; Inject 1.5 mg into the skin once a week.  Dispense: 0.5 mL; Refill: 6 - empagliflozin (JARDIANCE) 25 MG TABS tablet; Take 1 tablet (25 mg total) by mouth daily.  Dispense: 90 tablet; Refill: 1 - gabapentin (NEURONTIN) 300 MG capsule; Take orally 2 tablets (600 mg) in the morning and 3 tablets (900 mg) in the evening  Dispense: 150 capsule; Refill: 6 - metFORMIN (GLUCOPHAGE) 1000 MG tablet; TAKE ONE TABLET BY MOUTH TWICE A DAY WITH A MEAL  Dispense: 180 tablet; Refill: 1  5. Osteoarthritis of AC (acromioclavicular) joint Advised to use OTC Voltaren gel as pain needs infrequent  6. Screening for colon cancer - Ambulatory referral to Gastroenterology  7. Tinea pedis of both feet - terbinafine (LAMISIL AT) 1 % cream; Apply 1 application topically 2 (two) times daily.  Dispense: 30 g; Refill: 0   Meds ordered this encounter  Medications  . Insulin Pen Needle (TRUEPLUS PEN NEEDLES) 32G X 4 MM MISC    Sig: Use to inject Basaglar once daily.    Dispense:  100 each    Refill:  11  . Insulin Glargine (BASAGLAR KWIKPEN) 100 UNIT/ML    Sig: Inject 17 Units into the skin at bedtime.    Dispense:  30 mL    Refill:  6    Dose increase  . terbinafine (LAMISIL AT) 1 % cream    Sig: Apply  1 application topically 2 (two) times daily.    Dispense:  30 g    Refill:  0  . amLODipine (NORVASC) 10 MG tablet    Sig: Take  1 tablet (10 mg total) by mouth daily.    Dispense:  90 tablet    Refill:  1  . atorvastatin (LIPITOR) 40 MG tablet    Sig: Take 1 tablet (40 mg total) by mouth daily.    Dispense:  90 tablet    Refill:  1  . Dulaglutide 1.5 MG/0.5ML SOPN    Sig: Inject 1.5 mg into the skin once a week.    Dispense:  0.5 mL    Refill:  6  . empagliflozin (JARDIANCE) 25 MG TABS tablet    Sig: Take 1 tablet (25 mg total) by mouth daily.    Dispense:  90 tablet    Refill:  1  . gabapentin (NEURONTIN) 300 MG capsule    Sig: Take orally 2 tablets (600 mg) in the morning and 3 tablets (900 mg) in the evening    Dispense:  150 capsule    Refill:  6    Dose increase  . lisinopril-hydrochlorothiazide (ZESTORETIC) 20-12.5 MG tablet    Sig: Take 1 tablet by mouth daily.    Dispense:  90 tablet    Refill:  1  . metFORMIN (GLUCOPHAGE) 1000 MG tablet    Sig: TAKE ONE TABLET BY MOUTH TWICE A DAY WITH A MEAL    Dispense:  180 tablet    Refill:  1    Follow-up: Return in about 3 months (around 06/19/2020) for Diabetes.       [name], MD, FAAFP. [facility] [place], [place]   03/22/2020, 1:02 PM

## 2020-03-23 LAB — CMP14+EGFR
ALT: 30 IU/L (ref 0–44)
AST: 17 IU/L (ref 0–40)
Albumin/Globulin Ratio: 1.5 (ref 1.2–2.2)
Albumin: 4.8 g/dL (ref 3.8–4.9)
Alkaline Phosphatase: 93 IU/L (ref 44–121)
BUN/Creatinine Ratio: 8 — ABNORMAL LOW (ref 9–20)
BUN: 10 mg/dL (ref 6–24)
Bilirubin Total: 0.9 mg/dL (ref 0.0–1.2)
CO2: 23 mmol/L (ref 20–29)
Calcium: 9.8 mg/dL (ref 8.7–10.2)
Chloride: 100 mmol/L (ref 96–106)
Creatinine, Ser: 1.19 mg/dL (ref 0.76–1.27)
GFR calc Af Amer: 81 mL/min/{1.73_m2} (ref >59)
GFR calc non Af Amer: 70 mL/min/{1.73_m2} (ref >59)
Globulin, Total: 3.1 g/dL (ref 1.5–4.5)
Glucose: 169 mg/dL — ABNORMAL HIGH (ref 65–99)
Potassium: 4 mmol/L (ref 3.5–5.2)
Sodium: 140 mmol/L (ref 134–144)
Total Protein: 7.9 g/dL (ref 6.0–8.5)

## 2020-03-23 LAB — LIPID PANEL
Chol/HDL Ratio: 4.3 ratio (ref 0.0–5.0)
Cholesterol, Total: 112 mg/dL (ref 100–199)
HDL: 26 mg/dL — ABNORMAL LOW (ref >39)
LDL Chol Calc (NIH): 64 mg/dL (ref 0–99)
Triglycerides: 123 mg/dL (ref 0–149)
VLDL Cholesterol Cal: 22 mg/dL (ref 5–40)

## 2020-03-23 LAB — MICROALBUMIN / CREATININE URINE RATIO
Creatinine, Urine: 87.2 mg/dL
Microalb/Creat Ratio: 8 mg/g creat (ref 0–29)
Microalbumin, Urine: 7 ug/mL

## 2020-03-25 ENCOUNTER — Telehealth: Payer: Self-pay

## 2020-03-25 NOTE — Telephone Encounter (Signed)
Patient name and DOB has been verified Patient was informed of lab results. Patient had no questions.  

## 2020-03-25 NOTE — Telephone Encounter (Signed)
-----   Message from [name], MD sent at 03/23/2020  5:44 PM EST ----- Total cholesterol is normal however HDL which is his good cholesterol is a bit low.  Exercise and low cholesterol diet to be beneficial.  Other labs are stable

## 2020-04-25 ENCOUNTER — Encounter: Payer: Self-pay | Admitting: Gastroenterology

## 2020-05-17 ENCOUNTER — Other Ambulatory Visit: Payer: Self-pay | Admitting: Family Medicine

## 2020-05-17 DIAGNOSIS — E1142 Type 2 diabetes mellitus with diabetic polyneuropathy: Secondary | ICD-10-CM

## 2020-05-17 NOTE — Telephone Encounter (Signed)
Requested Prescriptions  Pending Prescriptions Disp Refills  . glipiZIDE (GLUCOTROL XL) 10 MG 24 hr tablet [Pharmacy Med Name: glipiZIDE XL 10 MG TABLET] 180 tablet 0    Sig: TAKE TWO TABLETS BY MOUTH DAILY WITH BREAKFAST     Endocrinology:  Diabetes - Sulfonylureas Failed - 05/17/2020  9:08 AM      Failed - HBA1C is between 0 and 7.9 and within 180 days    HbA1c, POC (controlled diabetic range)  Date Value Ref Range Status  03/22/2020 8.6 (A) 0.0 - 7.0 % Final         Passed - Valid encounter within last 6 months    Recent Outpatient Visits          1 month ago Type 2 diabetes mellitus with hyperglycemia, with long-term current use of insulin (HCC)   [place], [place], MD   4 months ago Type 2 diabetes mellitus with hyperglycemia, with long-term current use of insulin ([place])   [place], Tuttle, MD   7 months ago Type 2 diabetes mellitus with hyperglycemia, with long-term current use of insulin ([place])   [place], [place], MD   1 year ago Type 2 diabetes mellitus with diabetic polyneuropathy, with long-term current use of insulin ([place])   [place], Charlane Ferretti, MD   1 year ago Screening for prostate cancer   [place], MD      Future Appointments            In 1 month [name], MD [place]

## 2020-06-21 ENCOUNTER — Ambulatory Visit: Payer: BC Managed Care – PPO | Admitting: Family Medicine

## 2020-06-24 ENCOUNTER — Ambulatory Visit (AMBULATORY_SURGERY_CENTER): Payer: Self-pay

## 2020-06-24 ENCOUNTER — Other Ambulatory Visit: Payer: Self-pay

## 2020-06-24 VITALS — Ht 72.0 in | Wt 292.0 lb

## 2020-06-24 DIAGNOSIS — Z1211 Encounter for screening for malignant neoplasm of colon: Secondary | ICD-10-CM

## 2020-06-24 MED ORDER — GOLYTELY 236 G PO SOLR: 4000.0000 mL | ORAL | 0 refills | Status: DC

## 2020-06-24 NOTE — Progress Notes (Signed)
No egg or soy allergy known to patient  No issues with past sedation with any surgeries or procedures---no past surgical procedures Patient denies ever being told they had issues or difficulty with intubation --no past surgical procedures No FH of Malignant Hyperthermia No diet pills per patient No home 02 use per patient  No blood thinners per patient  Pt denies issues with constipation  No A fib or A flutter  EMMI video via MyChart  COVID 19 guidelines implemented in [place] today with Pt and RN  Pt is fully vaccinated for Covid x 2 + booster;  NO PA's for preps discussed with pt in PV today  Discussed with pt there will be an out-of-pocket cost for prep and that varies from $0 to 70 dollars  Due to the COVID-19 pandemic we are asking patients to follow certain guidelines.  Pt aware of COVID protocols and [facility] guidelines

## 2020-07-04 ENCOUNTER — Encounter: Payer: Self-pay | Admitting: Gastroenterology

## 2020-07-08 ENCOUNTER — Encounter: Payer: Self-pay | Admitting: Gastroenterology

## 2020-07-08 ENCOUNTER — Other Ambulatory Visit: Payer: Self-pay

## 2020-07-08 ENCOUNTER — Encounter: Payer: BC Managed Care – PPO | Admitting: Gastroenterology

## 2020-07-08 ENCOUNTER — Ambulatory Visit (AMBULATORY_SURGERY_CENTER): Payer: BC Managed Care – PPO | Admitting: Gastroenterology

## 2020-07-08 VITALS — BP 139/88 | HR 85 | Temp 97.8°F | Resp 10 | Ht 72.0 in | Wt 292.0 lb

## 2020-07-08 DIAGNOSIS — K635 Polyp of colon: Secondary | ICD-10-CM

## 2020-07-08 DIAGNOSIS — Z1211 Encounter for screening for malignant neoplasm of colon: Secondary | ICD-10-CM | POA: Diagnosis present

## 2020-07-08 DIAGNOSIS — D123 Benign neoplasm of transverse colon: Secondary | ICD-10-CM

## 2020-07-08 DIAGNOSIS — D175 Benign lipomatous neoplasm of intra-abdominal organs: Secondary | ICD-10-CM | POA: Diagnosis not present

## 2020-07-08 DIAGNOSIS — K6389 Other specified diseases of intestine: Secondary | ICD-10-CM

## 2020-07-08 MED ORDER — SODIUM CHLORIDE 0.9 % IV SOLN: 500.0000 mL | Freq: Once | INTRAVENOUS | Status: DC

## 2020-07-08 NOTE — Progress Notes (Signed)
Vital signs checked by:KW  The patient states no changes in medical or surgical history since pre-visit screening on 06/24/2020.

## 2020-07-08 NOTE — Patient Instructions (Signed)
Information on polyps given to you today.  Await pathology results.  Resume previous diet and medications.  YOU HAD AN ENDOSCOPIC PROCEDURE TODAY AT THE [facility]:   Refer to the procedure report that was given to you for any specific questions about what was found during the examination.  If the procedure report does not answer your questions, please call your gastroenterologist to clarify.  If you requested that your care partner not be given the details of your procedure findings, then the procedure report has been included in a sealed envelope for you to review at your convenience later.  YOU SHOULD EXPECT: Some feelings of bloating in the abdomen. Passage of more gas than usual.  Walking can help get rid of the air that was put into your GI tract during the procedure and reduce the bloating. If you had a lower endoscopy (such as a colonoscopy or flexible sigmoidoscopy) you may notice spotting of blood in your stool or on the toilet paper. If you underwent a bowel prep for your procedure, you may not have a normal bowel movement for a few days.  Please Note:  You might notice some irritation and congestion in your nose or some drainage.  This is from the oxygen used during your procedure.  There is no need for concern and it should clear up in a day or so.  SYMPTOMS TO REPORT IMMEDIATELY:   Following lower endoscopy (colonoscopy or flexible sigmoidoscopy):  Excessive amounts of blood in the stool  Significant tenderness or worsening of abdominal pains  Swelling of the abdomen that is new, acute  Fever of 100F or higher   For urgent or emergent issues, a gastroenterologist can be reached at any hour by calling [phone]. Do not use MyChart messaging for urgent concerns.    DIET:  We do recommend a small meal at first, but then you may proceed to your regular diet.  Drink plenty of fluids but you should avoid alcoholic beverages for 24 hours.  ACTIVITY:  You should  plan to take it easy for the rest of today and you should NOT DRIVE or use heavy machinery until tomorrow (because of the sedation medicines used during the test).    FOLLOW UP: Our staff will call the number listed on your records 48-72 hours following your procedure to check on you and address any questions or concerns that you may have regarding the information given to you following your procedure. If we do not reach you, we will leave a message.  We will attempt to reach you two times.  During this call, we will ask if you have developed any symptoms of COVID 19. If you develop any symptoms (ie: fever, flu-like symptoms, shortness of breath, cough etc.) before then, please call [phone].  If you test positive for Covid 19 in the 2 weeks post procedure, please call and report this information to us.    If any biopsies were taken you will be contacted by phone or by letter within the next 1-3 weeks.  Please call us at [phone] if you have not heard about the biopsies in 3 weeks.    SIGNATURES/CONFIDENTIALITY: You and/or your care partner have signed paperwork which will be entered into your electronic medical record.  These signatures attest to the fact that that the information above on your After Visit Summary has been reviewed and is understood.  Full responsibility of the confidentiality of this discharge information lies with you and/or your care-partner. 

## 2020-07-08 NOTE — Progress Notes (Signed)
A/ox3, pleased with MAC, report to RN 

## 2020-07-08 NOTE — Op Note (Signed)
[facility] [place] Patient Name: Anthony Vincent Procedure Date: 07/08/2020 8:10 AM MRN: [id number] Endoscopist: Mallie Mussel L. Loletha Carrow , MD Age: 50 Referring MD:  Date of Birth: [birth year] Gender: Male Account #: [id number] Procedure:                Colonoscopy Indications:              Screening for colorectal malignant neoplasm, This                            is the patient's first colonoscopy Medicines:                Monitored Anesthesia Care Procedure:                Pre-Anesthesia Assessment:                           - Prior to the procedure, a History and Physical                            was performed, and patient medications and                            allergies were reviewed. The patient's tolerance of                            previous anesthesia was also reviewed. The risks                            and benefits of the procedure and the sedation                            options and risks were discussed with the patient.                            All questions were answered, and informed consent                            was obtained. Prior Anticoagulants: The patient has                            taken no previous anticoagulant or antiplatelet                            agents. ASA Grade Assessment: II - A patient with                            mild systemic disease. After reviewing the risks                            and benefits, the patient was deemed in                            satisfactory condition to undergo the procedure.  After obtaining informed consent, the colonoscope                            was passed under direct vision. Throughout the                            procedure, the patient's blood pressure, pulse, and                            oxygen saturations were monitored continuously. The                            Olympus [id number]) [id number] was introduced                            through the anus and  advanced to the the cecum,                            identified by appendiceal orifice and ileocecal                            valve. The colonoscopy was performed without                            difficulty. The patient tolerated the procedure                            well. The quality of the bowel preparation was                            good. The ileocecal valve, appendiceal orifice, and                            rectum were photographed. Scope In: 8:40:21 AM Scope Out: 6:21:30 AM Scope Withdrawal Time: 0 hours 14 minutes 58 seconds  Total Procedure Duration: 0 hours 18 minutes 25 seconds  Findings:                 The perianal and digital rectal examinations were                            normal.                           A diminutive polyp was found in the transverse                            colon. The polyp was sessile. The polyp was removed                            with a cold snare. Resection and retrieval were                            complete.  The exam was otherwise without abnormality on                            direct and retroflexion views. Complications:            No immediate complications. Estimated Blood Loss:     Estimated blood loss was minimal. Impression:               - One diminutive polyp in the transverse colon,                            removed with a cold snare. Resected and retrieved.                           - The examination was otherwise normal on direct                            and retroflexion views. Recommendation:           - Patient has a contact number available for                            emergencies. The signs and symptoms of potential                            delayed complications were discussed with the                            patient. Return to normal activities tomorrow.                            Written discharge instructions were provided to the                            patient.                            - Resume previous diet.                           - Continue present medications.                           - Await pathology results.                           - Repeat colonoscopy is recommended for                            surveillance. The colonoscopy date will be                            determined after pathology results from today's                            exam become available for review. [name] L. Loletha Carrow, MD 07/08/2020 9:04:41 AM This report has been  signed electronically.

## 2020-07-08 NOTE — Progress Notes (Signed)
Called to room to assist during endoscopic procedure.  Patient ID and intended procedure confirmed with present staff. Received instructions for my participation in the procedure from the performing physician.  

## 2020-07-12 ENCOUNTER — Telehealth: Payer: Self-pay

## 2020-07-12 ENCOUNTER — Telehealth: Payer: Self-pay | Admitting: *Deleted

## 2020-07-12 NOTE — Telephone Encounter (Signed)
  Follow up Call-  Call back number 07/08/2020  Post procedure Call Back phone  # [phone]  Permission to leave phone message Yes  Some recent data might be hidden    1st follow up call made.  NALM

## 2020-07-12 NOTE — Telephone Encounter (Signed)
  Follow up Call-  Call back number 07/08/2020  Post procedure Call Back phone  # [phone]  Permission to leave phone message Yes  Some recent data might be hidden     Patient questions:  Do you have a fever, pain , or abdominal swelling? No. Pain Score  0 *  Have you tolerated food without any problems? Yes.    Have you been able to return to your normal activities? Yes.    Do you have any questions about your discharge instructions: Diet   No. Medications  No. Follow up visit  No.  Do you have questions or concerns about your Care? No.  Actions: * If pain score is 4 or above: No action needed, pain <4.  1. Have you developed a fever since your procedure? no  2.   Have you had an respiratory symptoms (SOB or cough) since your procedure? no  3.   Have you tested positive for COVID 19 since your procedure no  4.   Have you had any family members/close contacts diagnosed with the COVID 19 since your procedure?  no   If yes to any of these questions please route to Joylene John, RN and Joella Prince, RN

## 2020-07-16 ENCOUNTER — Encounter: Payer: Self-pay | Admitting: Gastroenterology

## 2020-08-17 ENCOUNTER — Other Ambulatory Visit: Payer: Self-pay | Admitting: Family Medicine

## 2020-08-17 DIAGNOSIS — Z794 Long term (current) use of insulin: Secondary | ICD-10-CM

## 2020-08-17 DIAGNOSIS — E1142 Type 2 diabetes mellitus with diabetic polyneuropathy: Secondary | ICD-10-CM

## 2020-09-01 ENCOUNTER — Other Ambulatory Visit: Payer: Self-pay

## 2020-09-01 ENCOUNTER — Ambulatory Visit: Payer: BC Managed Care – PPO | Admitting: Physician Assistant

## 2020-09-01 VITALS — BP 125/88 | HR 76 | Temp 98.1°F | Resp 16 | Ht 72.01 in | Wt 284.0 lb

## 2020-09-01 DIAGNOSIS — I152 Hypertension secondary to endocrine disorders: Secondary | ICD-10-CM

## 2020-09-01 DIAGNOSIS — E785 Hyperlipidemia, unspecified: Secondary | ICD-10-CM

## 2020-09-01 DIAGNOSIS — M25511 Pain in right shoulder: Secondary | ICD-10-CM

## 2020-09-01 DIAGNOSIS — E1169 Type 2 diabetes mellitus with other specified complication: Secondary | ICD-10-CM

## 2020-09-01 DIAGNOSIS — R2689 Other abnormalities of gait and mobility: Secondary | ICD-10-CM

## 2020-09-01 DIAGNOSIS — E119 Type 2 diabetes mellitus without complications: Secondary | ICD-10-CM | POA: Diagnosis not present

## 2020-09-01 DIAGNOSIS — E1165 Type 2 diabetes mellitus with hyperglycemia: Secondary | ICD-10-CM

## 2020-09-01 DIAGNOSIS — Z794 Long term (current) use of insulin: Secondary | ICD-10-CM | POA: Diagnosis not present

## 2020-09-01 DIAGNOSIS — M62838 Other muscle spasm: Secondary | ICD-10-CM

## 2020-09-01 DIAGNOSIS — I10 Essential (primary) hypertension: Secondary | ICD-10-CM

## 2020-09-01 DIAGNOSIS — E7800 Pure hypercholesterolemia, unspecified: Secondary | ICD-10-CM

## 2020-09-01 DIAGNOSIS — E1159 Type 2 diabetes mellitus with other circulatory complications: Secondary | ICD-10-CM

## 2020-09-01 DIAGNOSIS — E1142 Type 2 diabetes mellitus with diabetic polyneuropathy: Secondary | ICD-10-CM

## 2020-09-01 LAB — POCT GLYCOSYLATED HEMOGLOBIN (HGB A1C): Hemoglobin A1C: 8.8 % — AB (ref 4.0–5.6)

## 2020-09-01 LAB — GLUCOSE, POCT (MANUAL RESULT ENTRY): POC Glucose: 202 mg/dl — AB (ref 70–99)

## 2020-09-01 MED ORDER — EMPAGLIFLOZIN 25 MG PO TABS: 25.0000 mg | ORAL_TABLET | Freq: Every day | ORAL | 1 refills | Status: DC

## 2020-09-01 MED ORDER — LISINOPRIL-HYDROCHLOROTHIAZIDE 20-12.5 MG PO TABS: 1.0000 | ORAL_TABLET | Freq: Every day | ORAL | 1 refills | Status: DC

## 2020-09-01 MED ORDER — METHOCARBAMOL 500 MG PO TABS: 1000.0000 mg | ORAL_TABLET | Freq: Three times a day (TID) | ORAL | 1 refills | Status: DC | PRN

## 2020-09-01 MED ORDER — AMLODIPINE BESYLATE 10 MG PO TABS: 10.0000 mg | ORAL_TABLET | Freq: Every day | ORAL | 1 refills | Status: DC

## 2020-09-01 MED ORDER — METFORMIN HCL 1000 MG PO TABS: ORAL_TABLET | ORAL | 1 refills | Status: DC

## 2020-09-01 MED ORDER — BASAGLAR KWIKPEN 100 UNIT/ML ~~LOC~~ SOPN: 20.0000 [IU] | PEN_INJECTOR | Freq: Every day | SUBCUTANEOUS | 6 refills | Status: DC

## 2020-09-01 MED ORDER — TRUEPLUS PEN NEEDLES 32G X 4 MM MISC: 11 refills | Status: DC

## 2020-09-01 MED ORDER — MELOXICAM 15 MG PO TABS: 15.0000 mg | ORAL_TABLET | Freq: Every day | ORAL | 0 refills | Status: DC

## 2020-09-01 MED ORDER — GLIPIZIDE ER 10 MG PO TB24: ORAL_TABLET | ORAL | 0 refills | Status: DC

## 2020-09-01 MED ORDER — ATORVASTATIN CALCIUM 40 MG PO TABS: 40.0000 mg | ORAL_TABLET | Freq: Every day | ORAL | 1 refills | Status: DC

## 2020-09-01 MED ORDER — GABAPENTIN 300 MG PO CAPS: ORAL_CAPSULE | ORAL | 6 refills | Status: DC

## 2020-09-01 MED ORDER — DULAGLUTIDE 1.5 MG/0.5ML ~~LOC~~ SOAJ: 1.5000 mg | SUBCUTANEOUS | 6 refills | Status: DC

## 2020-09-01 NOTE — Assessment & Plan Note (Addendum)
increase water intake, monitor BG as indicated, take medications as prescribed. Labs today including Hgb A1c. Basaglar dose increased to 20Units SQ daily.

## 2020-09-01 NOTE — Assessment & Plan Note (Signed)
Exercise, eat plant base diet and take medications as prescribed. Labs today

## 2020-09-01 NOTE — Assessment & Plan Note (Signed)
Continue taking BP medications as prescribed.

## 2020-09-01 NOTE — Progress Notes (Signed)
Pt presents for diabetes check Pt states he has been experiencing right shoulder and arm pain, and pain in left leg

## 2020-09-01 NOTE — Patient Instructions (Addendum)
#  Drink 80-100 ounces of water per day #Check blood sure more frequently and keep a log, report BG less than 80mg /dl or greater than 249mg /dl to the clinic or local ED.

## 2020-09-01 NOTE — Progress Notes (Signed)
Patient has been counseled on age-appropriate routine health concerns for screening and prevention. These are reviewed and up-to-date. Referrals have been placed accordingly. Immunizations are up-to-date or declined.    Subjective:   Chief Complaint  Patient presents with   Diabetes    CC: "Diabetes follow up, imbalance and pain to right shoulder"  HPI: Patient is a 50 year old African American Anthony Vincent who presents to the clinic this morning for diabetes follow up, pain to the right should (x 3-4 months) and complaints of imbalance (x 1 month). Patient was last seen by his PCP back in February of 2022. Patient monitors his BG intermittently and takes all his medications only 80% of the time. His pain to the right shoulder is worse when he moves the joint, no hx of injury to the extremity or back however. Describes the pain as doll. About a month ago patient noticed that his balance was changing, no fall or injury occurred. Last eye exam within 12 months. Endorses not drinking enough water.    Diabetes He has type 2 diabetes mellitus.  Review of Systems  Constitutional: Negative.   HENT: Negative.    Eyes: Negative.   Respiratory: Negative.    Cardiovascular: Negative.   Musculoskeletal:  Positive for joint pain.  Neurological: Negative.   Endo/Heme/Allergies: Negative.    Past Medical History:  Diagnosis Date   Allergy    seasonal allergies   Diabetes mellitus without complication ([place])    on meds   Diabetic peripheral neuropathy ([place])    on meds   Hyperlipidemia    on meds   Hypertension    on meds   Neuromuscular disorder [facility])     Past Surgical History:  Procedure Laterality Date   WISDOM TOOTH EXTRACTION      Family History  Problem Relation Age of Onset   Diabetes Mother    Diabetes Father    Colon cancer Neg Hx    Colon polyps Neg Hx    Esophageal cancer Neg Hx    Stomach cancer Neg Hx    Rectal cancer Neg Hx     Social History Reviewed with no changes to  be made today.   Outpatient Medications Prior to Visit  Medication Sig Dispense Refill   Pediatric Multivit-Minerals-C (CHEWABLES MULTIVITAMIN PO) Take by mouth daily.     sildenafil (VIAGRA) 50 MG tablet Take 1 tablet (50 mg total) by mouth daily as needed for erectile dysfunction. 20 tablet 1   amLODipine (NORVASC) 10 MG tablet Take 1 tablet (10 mg total) by mouth daily. 90 tablet 1   atorvastatin (LIPITOR) 40 MG tablet Take 1 tablet (40 mg total) by mouth daily. 90 tablet 1   Dulaglutide 1.5 MG/0.5ML SOPN Inject 1.5 mg into the skin once a week. 0.5 mL 6   empagliflozin (JARDIANCE) 25 MG TABS tablet Take 1 tablet (25 mg total) by mouth daily. 90 tablet 1   gabapentin (NEURONTIN) 300 MG capsule Take orally 2 tablets (600 mg) in the morning and 3 tablets (900 mg) in the evening 150 capsule 6   glipiZIDE (GLUCOTROL XL) 10 MG 24 hr tablet TAKE TWO TABLETS BY MOUTH DAILY WITH BREAKFAST 180 tablet 0   Insulin Pen Needle (TRUEPLUS PEN NEEDLES) 32G X 4 MM MISC Use to inject Basaglar once daily. 100 each 11   lisinopril-hydrochlorothiazide (ZESTORETIC) 20-12.5 MG tablet Take 1 tablet by mouth daily. 90 tablet 1   metFORMIN (GLUCOPHAGE) 1000 MG tablet TAKE ONE TABLET BY MOUTH TWICE A DAY  WITH A MEAL 180 tablet 1   methocarbamol (ROBAXIN) 500 MG tablet Take 2 tablets (1,000 mg total) by mouth every 8 (eight) hours as needed for muscle spasms. 60 tablet 1   terbinafine (LAMISIL AT) 1 % cream Apply 1 application topically 2 (two) times daily. 30 g 0   Insulin Glargine (BASAGLAR KWIKPEN) 100 UNIT/ML Inject 17 Units into the skin at bedtime. 30 mL 6   Facility-Administered Medications Prior to Visit  Medication Dose Route Frequency Provider Last Rate Last Admin   0.9 %  sodium chloride infusion  500 mL Intravenous Once Nelida Meuse III, MD        No Known Allergies     Objective:    BP 125/88 (BP Location: Left Arm, Patient Position: Sitting, Cuff Size: Large)   Pulse 76   Temp 98.1 F (36.7 C)    Resp 16   Ht 6' 0.01" (1.829 m)   Wt 284 lb (128.8 kg)   SpO2 98%   BMI 38.51 kg/m  Wt Readings from Last 3 Encounters:  09/01/20 284 lb (128.8 kg)  07/08/20 292 lb (132.5 kg)  06/24/20 292 lb (132.5 kg)    Physical Exam Constitutional:      Appearance: Normal appearance. He is obese.  Cardiovascular:     Rate and Rhythm: Normal rate and regular rhythm.  Pulmonary:     Effort: Pulmonary effort is normal.     Breath sounds: Normal breath sounds.  Musculoskeletal:     Right shoulder: Decreased range of motion.     Left shoulder: Normal.  Neurological:     General: No focal deficit present.     Mental Status: He is alert and oriented to person, place, and time.     Assessment & Plan:  Anthony Vincent was seen today for diabetes.  Diagnoses and all orders for this visit:  Type 2 diabetes mellitus without complication, with long-term current use of insulin (HCC) -     Glucose (CBG) -     HgB A1c -     CBC with Differential -     CMP14+EGFR -     Lipid Panel -     TSH -     Dulaglutide 1.5 MG/0.5ML SOPN; Inject 1.5 mg into the skin once a week. -     empagliflozin (JARDIANCE) 25 MG TABS tablet; Take 1 tablet (25 mg total) by mouth daily. -     gabapentin (NEURONTIN) 300 MG capsule; Take orally 2 tablets (600 mg) in the morning and 3 tablets (900 mg) in the evening -     glipiZIDE (GLUCOTROL XL) 10 MG 24 hr tablet; TAKE TWO TABLETS BY MOUTH DAILY WITH BREAKFAST -     Insulin Glargine (BASAGLAR KWIKPEN) 100 UNIT/ML; Inject 20 Units into the skin at bedtime. -     Insulin Pen Needle (TRUEPLUS PEN NEEDLES) 32G X 4 MM MISC; Use to inject Basaglar once daily. -     metFORMIN (GLUCOPHAGE) 1000 MG tablet; TAKE ONE TABLET BY MOUTH TWICE A DAY WITH A MEAL  Essential hypertension, benign -     amLODipine (NORVASC) 10 MG tablet; Take 1 tablet (10 mg total) by mouth daily. -     lisinopril-hydrochlorothiazide (ZESTORETIC) 20-12.5 MG tablet; Take 1 tablet by mouth daily.  Dyslipidemia -      atorvastatin (LIPITOR) 40 MG tablet; Take 1 tablet (40 mg total) by mouth daily.  Right shoulder pain, unspecified chronicity -     Ambulatory referral to Orthopedic Surgery -  methocarbamol (ROBAXIN) 500 MG tablet; Take 2 tablets (1,000 mg total) by mouth every 8 (eight) hours as needed for muscle spasms.  Imbalance -     CBC with Differential -     CMP14+EGFR -     TSH  Hypertension associated with diabetes (HCC) -     amLODipine (NORVASC) 10 MG tablet; Take 1 tablet (10 mg total) by mouth daily. -     lisinopril-hydrochlorothiazide (ZESTORETIC) 20-12.5 MG tablet; Take 1 tablet by mouth daily.  Hyperlipidemia associated with type 2 diabetes mellitus (HCC) -     atorvastatin (LIPITOR) 40 MG tablet; Take 1 tablet (40 mg total) by mouth daily.  Type 2 diabetes mellitus with diabetic polyneuropathy, with long-term current use of insulin (HCC) -     Dulaglutide 1.5 MG/0.5ML SOPN; Inject 1.5 mg into the skin once a week. -     empagliflozin (JARDIANCE) 25 MG TABS tablet; Take 1 tablet (25 mg total) by mouth daily. -     gabapentin (NEURONTIN) 300 MG capsule; Take orally 2 tablets (600 mg) in the morning and 3 tablets (900 mg) in the evening -     glipiZIDE (GLUCOTROL XL) 10 MG 24 hr tablet; TAKE TWO TABLETS BY MOUTH DAILY WITH BREAKFAST -     metFORMIN (GLUCOPHAGE) 1000 MG tablet; TAKE ONE TABLET BY MOUTH TWICE A DAY WITH A MEAL  Type 2 diabetes mellitus with hyperglycemia, with long-term current use of insulin (HCC) -     Insulin Glargine (BASAGLAR KWIKPEN) 100 UNIT/ML; Inject 20 Units into the skin at bedtime. -     Insulin Pen Needle (TRUEPLUS PEN NEEDLES) 32G X 4 MM MISC; Use to inject Basaglar once daily.  Muscle spasm -     methocarbamol (ROBAXIN) 500 MG tablet; Take 2 tablets (1,000 mg total) by mouth every 8 (eight) hours as needed for muscle spasms. -     meloxicam (MOBIC) 15 MG tablet; Take 1 tablet (15 mg total) by mouth daily. PRN Pain  Pure  hypercholesterolemia   Additional Plan: # DMII: increase water intake, monitor BG as indicated, take medications as prescribed. Labs today including Hgb A1c. Basaglar dose increased from 17-20Units. #Imbalance: stay hydrated and report new symptoms to the clinic or local ED. Labs ordered today #Dislipidemia: Exercise, eat plant base diet and take medications as prescribed. Labs today #Right shoulder pain: Referred to Ortho. Meloxicam PRN ordered. #Muscle Spasms: Robaxin PRN #Patient was seen and examined with Freeman Caldron, PA-C #Patient reminded to call for new or worsening symptoms or go to the local ED/call 911. Verbalized understanding.     Patient has been counseled extensively about nutrition and exercise as well as the importance of adherence with medications and regular follow-up. The patient was given clear instructions to go to ER or return to medical center if symptoms don't improve, worsen or new problems develop. The patient verbalized understanding.    Follow-up: Return in about 6 weeks (around 10/13/2020) for With PCP or first available to recheck issues with balance and BG.     Feliberto Gottron, [place] [facility] [place], [place]   09/01/2020, 11:56 AM Patient ID: Anthony Vincent, Anthony Vincent   DOB: Apr 02, 1970, 50 y.o.   MRN: [id number]

## 2020-09-01 NOTE — Assessment & Plan Note (Signed)
Referred to Ortho. Robaxin PRN ordered.

## 2020-09-02 LAB — CBC WITH DIFFERENTIAL/PLATELET
Basophils Absolute: 0.1 10*3/uL (ref 0.0–0.2)
Basos: 1 %
EOS (ABSOLUTE): 0.1 10*3/uL (ref 0.0–0.4)
Eos: 2 %
Hematocrit: 47.1 % (ref 37.5–51.0)
Hemoglobin: 16.2 g/dL (ref 13.0–17.7)
Immature Grans (Abs): 0 10*3/uL (ref 0.0–0.1)
Immature Granulocytes: 0 %
Lymphocytes Absolute: 2.3 10*3/uL (ref 0.7–3.1)
Lymphs: 30 %
MCH: 28.4 pg (ref 26.6–33.0)
MCHC: 34.4 g/dL (ref 31.5–35.7)
MCV: 83 fL (ref 79–97)
Monocytes Absolute: 0.5 10*3/uL (ref 0.1–0.9)
Monocytes: 7 %
Neutrophils Absolute: 4.5 10*3/uL (ref 1.4–7.0)
Neutrophils: 60 %
Platelets: 255 10*3/uL (ref 150–450)
RBC: 5.7 x10E6/uL (ref 4.14–5.80)
RDW: 16.1 % — ABNORMAL HIGH (ref 11.6–15.4)
WBC: 7.5 10*3/uL (ref 3.4–10.8)

## 2020-09-02 LAB — CMP14+EGFR
ALT: 45 IU/L — ABNORMAL HIGH (ref 0–44)
AST: 20 IU/L (ref 0–40)
Albumin/Globulin Ratio: 1.4 (ref 1.2–2.2)
Albumin: 4.7 g/dL (ref 3.8–4.9)
Alkaline Phosphatase: 94 IU/L (ref 44–121)
BUN/Creatinine Ratio: 10 (ref 9–20)
BUN: 11 mg/dL (ref 6–24)
Bilirubin Total: 0.9 mg/dL (ref 0.0–1.2)
CO2: 19 mmol/L — ABNORMAL LOW (ref 20–29)
Calcium: 9.7 mg/dL (ref 8.7–10.2)
Chloride: 101 mmol/L (ref 96–106)
Creatinine, Ser: 1.08 mg/dL (ref 0.76–1.27)
Globulin, Total: 3.3 g/dL (ref 1.5–4.5)
Glucose: 167 mg/dL — ABNORMAL HIGH (ref 65–99)
Potassium: 4.4 mmol/L (ref 3.5–5.2)
Sodium: 140 mmol/L (ref 134–144)
Total Protein: 8 g/dL (ref 6.0–8.5)
eGFR: 83 mL/min/{1.73_m2} (ref >59)

## 2020-09-02 LAB — LIPID PANEL
Chol/HDL Ratio: 4.2 ratio (ref 0.0–5.0)
Cholesterol, Total: 108 mg/dL (ref 100–199)
HDL: 26 mg/dL — ABNORMAL LOW (ref >39)
LDL Chol Calc (NIH): 64 mg/dL (ref 0–99)
Triglycerides: 93 mg/dL (ref 0–149)
VLDL Cholesterol Cal: 18 mg/dL (ref 5–40)

## 2020-09-02 LAB — TSH: TSH: 1.01 u[IU]/mL (ref 0.450–4.500)

## 2020-09-02 NOTE — Progress Notes (Signed)
See lab results  Thanks

## 2020-09-12 ENCOUNTER — Telehealth: Payer: Self-pay | Admitting: *Deleted

## 2020-09-12 NOTE — Telephone Encounter (Signed)
Pt returned call for lab results. Result note read, verbalizes understanding.

## 2020-09-20 ENCOUNTER — Ambulatory Visit: Payer: Self-pay

## 2020-09-20 ENCOUNTER — Other Ambulatory Visit: Payer: Self-pay

## 2020-09-20 ENCOUNTER — Encounter: Payer: Self-pay | Admitting: Orthopaedic Surgery

## 2020-09-20 ENCOUNTER — Ambulatory Visit (INDEPENDENT_AMBULATORY_CARE_PROVIDER_SITE_OTHER): Payer: BC Managed Care – PPO | Admitting: Orthopaedic Surgery

## 2020-09-20 VITALS — Ht 72.0 in | Wt 287.0 lb

## 2020-09-20 DIAGNOSIS — M25522 Pain in left elbow: Secondary | ICD-10-CM | POA: Diagnosis not present

## 2020-09-20 DIAGNOSIS — G8929 Other chronic pain: Secondary | ICD-10-CM | POA: Diagnosis not present

## 2020-09-20 DIAGNOSIS — M25511 Pain in right shoulder: Secondary | ICD-10-CM

## 2020-09-20 MED ORDER — LIDOCAINE HCL 2 % IJ SOLN
2.0000 mL | INTRAMUSCULAR | Status: AC | PRN
  Administered 2020-09-20: 2 mL

## 2020-09-20 MED ORDER — BUPIVACAINE HCL 0.25 % IJ SOLN
2.0000 mL | INTRAMUSCULAR | Status: AC | PRN
  Administered 2020-09-20: 2 mL via INTRA_ARTICULAR

## 2020-09-20 MED ORDER — DICLOFENAC SODIUM 75 MG PO TBEC: 75.0000 mg | DELAYED_RELEASE_TABLET | Freq: Two times a day (BID) | ORAL | 2 refills | Status: DC | PRN

## 2020-09-20 MED ORDER — METHYLPREDNISOLONE ACETATE 40 MG/ML IJ SUSP
40.0000 mg | INTRAMUSCULAR | Status: AC | PRN
  Administered 2020-09-20: 40 mg via INTRA_ARTICULAR

## 2020-09-20 NOTE — Progress Notes (Signed)
\   Office Visit Note   Patient: Anthony Vincent           Date of Birth: Feb 29, 1970           MRN: [id number] Visit Date: 09/20/2020              Requested by: Anthony Vincent, Anthony Vincent,  Anthony Vincent [id number] PCP: Anthony Rakes, MD   Assessment & Plan: Visit Diagnoses:  1. Chronic right shoulder pain   2. Pain in left elbow     Plan: Impression is right shoulder subacromial bursitis and rotator cuff tendinitis and left elbow lateral epicondylitis.  In regards to the shoulder, we have discussed subacromial cortisone injection in addition to a Jobe exercise program for which she is agreeable to.  He tolerated the injection well.  The symptoms have not improved over the next several weeks he will let us know we we will entertain ordering an MRI of the right shoulder to assess for structural abnormalities.  In regards to the left elbow, we have discussed starting him on a course of oral anti-inflammatories in addition to a tennis elbow strap and tennis elbow exercises.  He is agreeable to this plan.  We have also discussed cortisone injection should his symptoms not improve.  He will follow-up with [place] as needed.  Call with concerns or questions.  Follow-Up Instructions: Return if symptoms worsen or fail to improve.   Orders:  Orders Placed This Encounter  Procedures   Large Joint Inj: R subacromial bursa   XR Shoulder Right   XR Elbow Complete Left (3+View)   Meds ordered this encounter  Medications   diclofenac (VOLTAREN) 75 MG EC tablet    Sig: Take 1 tablet (75 mg total) by mouth 2 (two) times daily as needed.    Dispense:  60 tablet    Refill:  2      Procedures: Large Joint Inj: R subacromial bursa on 09/20/2020 11:07 AM Indications: pain Details: 22 G needle Medications: 2 mL lidocaine 2 %; 2 mL bupivacaine 0.25 %; 40 mg methylPREDNISolone acetate 40 MG/ML Outcome: tolerated well, no immediate complications Patient was prepped and draped in the usual  sterile fashion.      Clinical Data: No additional findings.   Subjective: Chief Complaint  Patient presents with   Right Shoulder - Pain   Left Elbow - Pain    HPI patient is a pleasant 50 year old gentleman who comes in today with right shoulder and left elbow pain.  In regards to his right shoulder, he has had pain for the past 5 months.  No known injury or change in activity.  The pain has slightly improved over the past few months.  The pain is to the deltoid but occasionally to the top of the shoulder.  He denies any weakness.  He has pain with any motion of the shoulder, specifically internal rotation.  He denies any paresthesias to the right upper extremity.  In regards to the left elbow, he has pain to the lateral aspect.  This began about 3 weeks ago after doing a lot of yard work where he was pushing and picking up things.  The pain is worse with supination of the forearm.  Review of Systems as detailed in HPI.  All others reviewed and are negative.   Objective: Vital Signs: Ht 6' (1.829 m)   Wt 287 lb (130.2 kg)   BMI 38.92 kg/m   Physical Exam well-developed well-nourished gentleman no acute  distress.  Alert and oriented x3.  Ortho Exam right shoulder exam shows near full forward flexion and abduction.  Internal rotation to his back pocket.  Markedly positive empty can.  Full strength throughout.  He is neurovascular intact distally.  Left elbow exam reveals moderate tenderness to the lateral epicondyle.  Increased pain with supination and gripping.  Increased pain with resisted long finger extension.  He is neurovascular intact distally.  Specialty Comments:  No specialty comments available.  Imaging: XR Elbow Complete Left (3+View)  Result Date: 09/20/2020 No acute or structural abnormalities  XR Shoulder Right  Result Date: 09/20/2020 Moderate degenerative changes to the [facility] joint.  Mild degenerative changes glenohumeral joint.    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   Tinea corporis 09/10/2013   Essential hypertension, benign 10/01/2012   Diabetes ([place]) 10/01/2012   Dyslipidemia 10/01/2012   Past Medical History:  Diagnosis Date   Allergy    seasonal allergies   Diabetes mellitus without complication ([place])    on meds   Diabetic peripheral neuropathy (HCC)    on meds   Hyperlipidemia    on meds   Hypertension    on meds   Neuromuscular disorder ([place])     Family History  Problem Relation Age of Onset   Diabetes Mother    Diabetes Father    Colon cancer Neg Hx    Colon polyps Neg Hx    Esophageal cancer Neg Hx    Stomach cancer Neg Hx    Rectal cancer Neg Hx     Past Surgical History:  Procedure Laterality Date   WISDOM TOOTH EXTRACTION     Social History   Occupational History   Not on file  Tobacco Use   Smoking status: Never   Smokeless tobacco: Never  Vaping Use   Vaping Use: Never used  Substance and Sexual Activity   Alcohol use: No   Drug use: No   Sexual activity: Not on file

## 2020-11-08 ENCOUNTER — Encounter: Payer: Self-pay | Admitting: Family Medicine

## 2020-11-08 ENCOUNTER — Ambulatory Visit: Payer: BC Managed Care – PPO | Attending: Family Medicine | Admitting: Family Medicine

## 2020-11-08 ENCOUNTER — Other Ambulatory Visit: Payer: Self-pay

## 2020-11-08 DIAGNOSIS — E1142 Type 2 diabetes mellitus with diabetic polyneuropathy: Secondary | ICD-10-CM | POA: Diagnosis not present

## 2020-11-08 DIAGNOSIS — Z794 Long term (current) use of insulin: Secondary | ICD-10-CM | POA: Diagnosis not present

## 2020-11-08 DIAGNOSIS — R269 Unspecified abnormalities of gait and mobility: Secondary | ICD-10-CM

## 2020-11-08 DIAGNOSIS — E1165 Type 2 diabetes mellitus with hyperglycemia: Secondary | ICD-10-CM

## 2020-11-08 MED ORDER — TRULICITY 3 MG/0.5ML ~~LOC~~ SOAJ
3.0000 mg | SUBCUTANEOUS | 6 refills | Status: DC
Start: 2020-11-08 — End: 2021-03-01

## 2020-11-08 NOTE — Progress Notes (Signed)
Virtual Visit via Video Note  I connected with Anthony Vincent, on 11/08/2020 at 3:38 PM by video enabled telemedicine device due to the COVID-19 pandemic and verified that I am speaking with the correct person using two identifiers.   Consent: I discussed the limitations, risks, security and privacy concerns of performing an evaluation and management service by telemedicine and the availability of in person appointments. I also discussed with the patient that there may be a patient responsible charge related to this service. The patient expressed understanding and agreed to proceed.   Location of Patient: Home  Location of Provider: Clinic   Persons participating in Telemedicine visit: Anthony Vincent Dr. Margarita Rana     History of Present Illness: Anthony Vincent is a 50 y.o. year old male  with a history of type 2 diabetes mellitus (A1c 8.6), hypertension, hyperlipidemia who presents today for follow-up visit.    He complains of problems with his balance over the last 5 months. When he is walking and he attempts to stop, he has to twirl a bit to maintain his balance.  Has no visual concerns or headaches. Denies presence of sinus issues He has not started any new medications He has neuropathy in the balls of his feet and is on Gabapentin and just describes mild numbness in the ball of his feet but otherwise previous shooting pains have resolved.  He denies presence of calluses on his feet.  Has not been to see podiatry in 2 years.  Fasting sugars are 105 to 150 range and he endorses compliance with his medications.  Basaglar dose was increased from 17 units to 20 units at his last visit with the nurse practitioner. Past Medical History:  Diagnosis Date   Allergy    seasonal allergies   Diabetes mellitus without complication ([place])    on meds   Diabetic peripheral neuropathy ([place])    on meds   Hyperlipidemia    on meds   Hypertension    on meds   Neuromuscular disorder ([place])     No Known Allergies  Current Outpatient Medications on File Prior to Visit  Medication Sig Dispense Refill   amLODipine (NORVASC) 10 MG tablet Take 1 tablet (10 mg total) by mouth daily. 90 tablet 1   atorvastatin (LIPITOR) 40 MG tablet Take 1 tablet (40 mg total) by mouth daily. 90 tablet 1   diclofenac (VOLTAREN) 75 MG EC tablet Take 1 tablet (75 mg total) by mouth 2 (two) times daily as needed. 60 tablet 2   Dulaglutide 1.5 MG/0.5ML SOPN Inject 1.5 mg into the skin once a week. 0.5 mL 6   empagliflozin (JARDIANCE) 25 MG TABS tablet Take 1 tablet (25 mg total) by mouth daily. 90 tablet 1   gabapentin (NEURONTIN) 300 MG capsule Take orally 2 tablets (600 mg) in the morning and 3 tablets (900 mg) in the evening 150 capsule 6   glipiZIDE (GLUCOTROL XL) 10 MG 24 hr tablet TAKE TWO TABLETS BY MOUTH DAILY WITH BREAKFAST 180 tablet 0   Insulin Glargine (BASAGLAR KWIKPEN) 100 UNIT/ML Inject 20 Units into the skin at bedtime. 30 mL 6   Insulin Pen Needle (TRUEPLUS PEN NEEDLES) 32G X 4 MM MISC Use to inject Basaglar once daily. 100 each 11   lisinopril-hydrochlorothiazide (ZESTORETIC) 20-12.5 MG tablet Take 1 tablet by mouth daily. 90 tablet 1   meloxicam (MOBIC) 15 MG tablet Take 1 tablet (15 mg total) by mouth daily. PRN Pain 30 tablet 0   metFORMIN (GLUCOPHAGE) 1000  MG tablet TAKE ONE TABLET BY MOUTH TWICE A DAY WITH A MEAL 180 tablet 1   methocarbamol (ROBAXIN) 500 MG tablet Take 2 tablets (1,000 mg total) by mouth every 8 (eight) hours as needed for muscle spasms. 60 tablet 1   Pediatric Multivit-Minerals-C (CHEWABLES MULTIVITAMIN PO) Take by mouth daily.     sildenafil (VIAGRA) 50 MG tablet Take 1 tablet (50 mg total) by mouth daily as needed for erectile dysfunction. 20 tablet 1   terbinafine (LAMISIL AT) 1 % cream Apply 1 application topically 2 (two) times daily. 30 g 0   [DISCONTINUED] mupirocin nasal ointment (BACTROBAN) 2 % Place 1 application into the nose 2 (two) times daily. Use  one-half of tube in each nostril twice daily for five (5) days. After application, press sides of nose together and gently massage. (Patient not taking: Reported on 10/08/2019) 10 g 0   Current Facility-Administered Medications on File Prior to Visit  Medication Dose Route Frequency Provider Last Rate Last Admin   0.9 %  sodium chloride infusion  500 mL Intravenous Once Nelida Meuse III, MD        ROS: See HPI  Observations/Objective: Awake, alert, oriented x3 Cranial nerves-normal Cerebellar test-normal rapid alternating hand movements, normal finger-to-nose test Respiration-normal  CMP Latest Ref Rng & Units 09/01/2020 03/22/2020 12/21/2019  Glucose 65 - 99 mg/dL 167(H) 169(H) 95  BUN 6 - 24 mg/dL 11 10 9   Creatinine 0.76 - 1.27 mg/dL 1.08 1.19 1.01  Sodium 134 - 144 mmol/L 140 140 141  Potassium 3.5 - 5.2 mmol/L 4.4 4.0 4.0  Chloride 96 - 106 mmol/L 101 100 102  CO2 20 - 29 mmol/L 19(L) 23 21  Calcium 8.7 - 10.2 mg/dL 9.7 9.8 9.3  Total Protein 6.0 - 8.5 g/dL 8.0 7.9 -  Total Bilirubin 0.0 - 1.2 mg/dL 0.9 0.9 -  Alkaline Phos 44 - 121 IU/L 94 93 -  AST 0 - 40 IU/L 20 17 -  ALT 0 - 44 IU/L 45(H) 30 -    Lipid Panel     Component Value Date/Time   CHOL 108 09/01/2020 1053   TRIG 93 09/01/2020 1053   HDL 26 (L) 09/01/2020 1053   CHOLHDL 4.2 09/01/2020 1053   CHOLHDL 4.3 12/22/2015 1207   VLDL 11 12/22/2015 1207   LDLCALC 64 09/01/2020 1053   LABVLDL 18 09/01/2020 1053    Lab Results  Component Value Date   HGBA1C 8.8 (A) 09/01/2020     Assessment and Plan: 1. Type 2 diabetes mellitus with hyperglycemia, with long-term current use of insulin (HCC) Uncontrolled Increased dose of Trulicity Continue other medications at current dose Counseled on Diabetic diet, my plate method, 856 minutes of moderate intensity exercise/week Blood sugar logs with fasting goals of 80-120 mg/dl, random of less than 180 and in the event of sugars less than 60 mg/dl or greater than 400  mg/dl encouraged to notify the clinic. Advised on the need for annual eye exams, annual foot exams, Pneumonia vaccine. - Dulaglutide (TRULICITY) 3 DJ/4.9FW SOPN; Inject 3 mg as directed once a week.  Dispense: 2 mL; Refill: 6  2. Type 2 diabetes mellitus with diabetic polyneuropathy, with long-term current use of insulin (HCC) Stable He does have minimal symptoms which do not warrant the need to increase gabapentin dose at this time  3. Gait abnormality Unclear etiology He does not seem to have ataxia and is too young for Parkinson's I have advised him to call his podiatrist for an  appointment and once he has his feet checked we can look into neurology referral if symptoms persist.   Follow Up Instructions: 3 months   I discussed the assessment and treatment plan with the patient. The patient was provided an opportunity to ask questions and all were answered. The patient agreed with the plan and demonstrated an understanding of the instructions.   The patient was advised to call back or seek an in-person evaluation if the symptoms worsen or if the condition fails to improve as anticipated.     I provided 15 minutes total of Telehealth time during this encounter including median intraservice time, reviewing previous notes, investigations, ordering medications, medical decision making, coordinating care and patient verbalized understanding at the end of the visit.     [name], MD, FAAFP. [facility] [place], [place]   11/08/2020, 3:38 PM

## 2020-11-10 ENCOUNTER — Telehealth: Payer: Self-pay | Admitting: Family Medicine

## 2020-11-10 NOTE — Telephone Encounter (Signed)
Called patient to get scheduled for 3 month f/u with Dr. Margarita Rana in Dec. No answer left vm to call [phone] to get scheduled.

## 2020-11-10 NOTE — Telephone Encounter (Signed)
-----   Message from [name], MD sent at 11/08/2020  3:51 PM EDT ----- Regarding: 3 month appt Hi, Please schedule patient for a follow-up appointment in 3 months-follow-up on diabetes melitis. Thank you, Dr. Margarita Rana

## 2020-11-15 ENCOUNTER — Other Ambulatory Visit: Payer: Self-pay | Admitting: Family Medicine

## 2020-11-15 DIAGNOSIS — I152 Hypertension secondary to endocrine disorders: Secondary | ICD-10-CM

## 2020-11-15 DIAGNOSIS — E1159 Type 2 diabetes mellitus with other circulatory complications: Secondary | ICD-10-CM

## 2020-11-15 DIAGNOSIS — I10 Essential (primary) hypertension: Secondary | ICD-10-CM

## 2020-11-15 NOTE — Telephone Encounter (Signed)
Requested medication (s) are due for refill today: Yes  Requested medication (s) are on the active medication list: Yes  Last refill:  09/01/20  Future visit scheduled: No  Notes to clinic:  Unable to refill per protocol, last refill by another provider.      Requested Prescriptions  Pending Prescriptions Disp Refills   lisinopril-hydrochlorothiazide (ZESTORETIC) 20-12.5 MG tablet [Pharmacy Med Name: LISINOPRIL-HCTZ 20-12.5 MG TAB] 37 tablet     Sig: TAKE ONE TABLET BY MOUTH DAILY     Cardiovascular:  ACEI + Diuretic Combos Passed - 11/15/2020  8:56 AM      Passed - Na in normal range and within 180 days    Sodium  Date Value Ref Range Status  09/01/2020 140 134 - 144 mmol/L Final          Passed - K in normal range and within 180 days    Potassium  Date Value Ref Range Status  09/01/2020 4.4 3.5 - 5.2 mmol/L Final          Passed - Cr in normal range and within 180 days    Creat  Date Value Ref Range Status  12/22/2015 1.12 0.60 - 1.35 mg/dL Final   Creatinine, Ser  Date Value Ref Range Status  09/01/2020 1.08 0.76 - 1.27 mg/dL Final   Creatinine, Urine  Date Value Ref Range Status  12/22/2015 215 20 - 370 mg/dL Final          Passed - Ca in normal range and within 180 days    Calcium  Date Value Ref Range Status  09/01/2020 9.7 8.7 - 10.2 mg/dL Final          Passed - Patient is not pregnant      Passed - Last BP in normal range    BP Readings from Last 1 Encounters:  09/01/20 125/88          Passed - Valid encounter within last 6 months    Recent Outpatient Visits           1 week ago Type 2 diabetes mellitus with hyperglycemia, with long-term current use of insulin ([place])   [place], [place], MD   7 months ago Type 2 diabetes mellitus with hyperglycemia, with long-term current use of insulin ([place])   [place], [place], MD   11 months ago Type 2 diabetes mellitus with  hyperglycemia, with long-term current use of insulin ([place])   [place], [place], MD   1 year ago Type 2 diabetes mellitus with hyperglycemia, with long-term current use of insulin ([place])   [place], [place], MD   1 year ago Type 2 diabetes mellitus with diabetic polyneuropathy, with long-term current use of insulin ([place])   [facility] [name], MD

## 2021-02-07 ENCOUNTER — Encounter: Payer: Self-pay | Admitting: Family Medicine

## 2021-02-07 LAB — HM DIABETES EYE EXAM

## 2021-02-28 ENCOUNTER — Telehealth: Payer: Self-pay | Admitting: *Deleted

## 2021-02-28 NOTE — Telephone Encounter (Signed)
Copied from [place] [id number]. Topic: General - Other >> Feb 28, 2021  1:11 PM Valere Dross wrote: Reason for CRM: Pt called in stating his pharmacy is not able to get his Dulaglutide (TRULICITY) 3 TI/1.4ER SOPN  medication from the pharmacy because the manufacture doesn't have any, and pt has tried other pharmacy, and wants to know what he needs, please advise.

## 2021-03-01 ENCOUNTER — Other Ambulatory Visit: Payer: Self-pay

## 2021-03-01 ENCOUNTER — Telehealth: Payer: Self-pay

## 2021-03-01 ENCOUNTER — Encounter: Payer: Self-pay | Admitting: Podiatry

## 2021-03-01 ENCOUNTER — Ambulatory Visit: Payer: BC Managed Care – PPO | Admitting: Podiatry

## 2021-03-01 DIAGNOSIS — B351 Tinea unguium: Secondary | ICD-10-CM

## 2021-03-01 DIAGNOSIS — Z794 Long term (current) use of insulin: Secondary | ICD-10-CM | POA: Diagnosis not present

## 2021-03-01 DIAGNOSIS — G63 Polyneuropathy in diseases classified elsewhere: Secondary | ICD-10-CM

## 2021-03-01 DIAGNOSIS — E119 Type 2 diabetes mellitus without complications: Secondary | ICD-10-CM | POA: Diagnosis not present

## 2021-03-01 DIAGNOSIS — M79674 Pain in right toe(s): Secondary | ICD-10-CM

## 2021-03-01 DIAGNOSIS — M79675 Pain in left toe(s): Secondary | ICD-10-CM

## 2021-03-01 MED ORDER — SEMAGLUTIDE (2 MG/DOSE) 8 MG/3ML ~~LOC~~ SOPN: 2.0000 mg | PEN_INJECTOR | SUBCUTANEOUS | 3 refills | Status: DC

## 2021-03-01 NOTE — Telephone Encounter (Signed)
Trulicity 3 mg and 4.5 mg doses continue to be on backorder. One option would be to try two consecutive injections of the 1.5 mg doses, however, she has insurance and we've had trouble with insurance filling these.   Other options: transitioning to Ozempic if pt prefers to stay with a weekly GLP-1. IF this is not an option we may have titrate insulin until Trulicity becomes available.

## 2021-03-01 NOTE — Telephone Encounter (Signed)
Patient came by the office today and said he spoke with the pharmacy and they explained to him they aren't able to get the Trulicity from the manufacture. Patient is looking for advice on what he needs to do.

## 2021-03-01 NOTE — Addendum Note (Signed)
Addended by: [name] on: 03/01/2021 12:50 PM   Modules accepted: Orders

## 2021-03-01 NOTE — Telephone Encounter (Signed)
Pt has been called and a VM was left informing patient that medication has been changed due to Trulicity being on back order.

## 2021-03-01 NOTE — Telephone Encounter (Signed)
I have switched to Ozempic.  Thank you

## 2021-03-01 NOTE — Progress Notes (Signed)
°  Subjective:  Patient ID: Anthony Vincent, male    DOB: [birth year],   MRN: [id number]  Chief Complaint  Patient presents with   debride    [facility] _FBS: 140 a1C; 8 PCP: [place] x 4 mo    Foot Problem    bL plantar forefoot -w/ a weird sensation, per pt feels like a blockage or no blood flow x 1 yr - w/ numbness - no pain/redness/swelling tx: none    51 y.o. male presents for diabetic foot care as well as concern for weird sensation in the ball of his feet and concern for his balance. Relates burining and tingling in the ball of the foot. States he also has painful elongated and thickened toenails. Last A1c was 8 . Denies any other pedal complaints. Denies n/v/f/c.   PCP: [name] MD   Past Medical History:  Diagnosis Date   Allergy    seasonal allergies   Diabetes mellitus without complication ([place])    on meds   Diabetic peripheral neuropathy ([place])    on meds   Hyperlipidemia    on meds   Hypertension    on meds   Neuromuscular disorder ([place])     Objective:  Physical Exam: Vascular: DP/PT pulses 2/4 bilateral. CFT <3 seconds. Normal hair growth on digits. No edema.  Skin. No lacerations or abrasions bilateral feet. Nails 1-5 are thickened discolored and elongated with subungual debris.  Musculoskeletal: MMT 5/5 bilateral lower extremities in DF, PF, Inversion and Eversion. Deceased ROM in DF of ankle joint.  Neurological: Sensation intact to light touch.   Assessment:  No diagnosis found.   Plan:  Patient was evaluated and treated and all questions answered. -Discussed and educated patient on diabetic foot care, especially with regards to the vascular, neurological and musculoskeletal systems.  -Stressed the importance of good glycemic control and the detriment of not  controlling glucose levels in relation to the foot. -Discussed supportive shoes at all times and checking feet regularly.  -Discussed neuropathy symptoms. Patient currently on gabapentin.  -Mechanically  debrided all nails 1-5 bilateral using sterile nail nipper and filed with dremel without incident  -Answered all patient questions -Patient to return  in 3 months for at risk foot care -Patient advised to call the office if any problems or questions arise in the meantime.   Lorenda Peck, DPM

## 2021-03-07 ENCOUNTER — Ambulatory Visit: Payer: Self-pay

## 2021-03-07 ENCOUNTER — Encounter: Payer: Self-pay | Admitting: Orthopaedic Surgery

## 2021-03-07 ENCOUNTER — Ambulatory Visit: Payer: BC Managed Care – PPO | Admitting: Orthopaedic Surgery

## 2021-03-07 ENCOUNTER — Other Ambulatory Visit: Payer: Self-pay

## 2021-03-07 DIAGNOSIS — G8929 Other chronic pain: Secondary | ICD-10-CM | POA: Diagnosis not present

## 2021-03-07 DIAGNOSIS — M25562 Pain in left knee: Secondary | ICD-10-CM | POA: Diagnosis not present

## 2021-03-07 NOTE — Progress Notes (Signed)
Office Visit Note   Patient: Anthony Vincent           Date of Birth: 02/27/1970           MRN: [id number] Visit Date: 03/07/2021              Requested by: [name], MD [place],  [place] [address] PCP: [name], MD   Assessment & Plan: Visit Diagnoses:  1. Chronic pain of left knee     Plan: Impression is left knee pain.  I suspect that he may have suffered a small meniscus tear but he has had rapid improvement in his symptoms and pain.  I recommend taking scheduled over-the-counter NSAIDs for another week to 2 weeks to see how much improvement he gets from that.  If he does not feel any improvement after couple weeks he should follow-up with [place] and we may need to consider cortisone injection.  Follow-Up Instructions: No follow-ups on file.   Orders:  Orders Placed This Encounter  Procedures   XR KNEE 3 VIEW LEFT   No orders of the defined types were placed in this encounter.     Procedures: No procedures performed   Clinical Data: No additional findings.   Subjective: Chief Complaint  Patient presents with   Left Knee - Pain    Mr. Anthony Vincent is a 51 year old gentleman here for evaluation of left knee pain that started while he was on a cruise.  He was doing some dancing and the pain started 5 days later.  He does feel some clicking and pain with going up and down stairs.  He has pain all over the knee at 1 point but now he does not feel any localized pain.  He states that he is about 90% better than before.   Review of Systems  Constitutional: Negative.   All other systems reviewed and are negative.   Objective: Vital Signs: There were no vitals taken for this visit.  Physical Exam Vitals and nursing note reviewed.  Constitutional:      Appearance: He is well-developed.  Pulmonary:     Effort: Pulmonary effort is normal.  Abdominal:     Palpations: Abdomen is soft.  Skin:    General: Skin is warm.  Neurological:      Mental Status: He is alert and oriented to person, place, and time.  Psychiatric:        Behavior: Behavior normal.        Thought Content: Thought content normal.        Judgment: Judgment normal.    Ortho Exam  Left knee shows a trace effusion.  He has excellent range of motion.  1+ patellofemoral crepitus.  Collaterals and cruciates are stable.  No joint line tenderness.  Specialty Comments:  No specialty comments available.  Imaging: XR KNEE 3 VIEW LEFT  Result Date: 03/07/2021 Mild arthritis.  Well-preserved joint spaces.    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   Tinea corporis 09/10/2013   Essential hypertension, benign 10/01/2012   Diabetes ([place]) 10/01/2012   Dyslipidemia 10/01/2012   Past Medical History:  Diagnosis Date   Allergy    seasonal allergies   Diabetes mellitus without complication ([place])  on meds   Diabetic peripheral neuropathy (HCC)    on meds   Hyperlipidemia    on meds   Hypertension    on meds   Neuromuscular disorder ([place])     Family History  Problem Relation Age of Onset   Diabetes Mother    Diabetes Father    Colon cancer Neg Hx    Colon polyps Neg Hx    Esophageal cancer Neg Hx    Stomach cancer Neg Hx    Rectal cancer Neg Hx     Past Surgical History:  Procedure Laterality Date   WISDOM TOOTH EXTRACTION     Social History   Occupational History   Not on file  Tobacco Use   Smoking status: Never   Smokeless tobacco: Never  Vaping Use   Vaping Use: Never used  Substance and Sexual Activity   Alcohol use: No   Drug use: No   Sexual activity: Not on file

## 2021-04-07 ENCOUNTER — Other Ambulatory Visit: Payer: Self-pay | Admitting: Family Medicine

## 2021-04-07 DIAGNOSIS — E119 Type 2 diabetes mellitus without complications: Secondary | ICD-10-CM

## 2021-04-07 DIAGNOSIS — Z794 Long term (current) use of insulin: Secondary | ICD-10-CM

## 2021-04-07 DIAGNOSIS — E1142 Type 2 diabetes mellitus with diabetic polyneuropathy: Secondary | ICD-10-CM

## 2021-05-23 ENCOUNTER — Other Ambulatory Visit: Payer: Self-pay | Admitting: Family

## 2021-05-23 DIAGNOSIS — Z794 Long term (current) use of insulin: Secondary | ICD-10-CM

## 2021-05-23 DIAGNOSIS — E119 Type 2 diabetes mellitus without complications: Secondary | ICD-10-CM

## 2021-05-23 DIAGNOSIS — E1169 Type 2 diabetes mellitus with other specified complication: Secondary | ICD-10-CM

## 2021-05-23 DIAGNOSIS — E1142 Type 2 diabetes mellitus with diabetic polyneuropathy: Secondary | ICD-10-CM

## 2021-05-23 DIAGNOSIS — E785 Hyperlipidemia, unspecified: Secondary | ICD-10-CM

## 2021-05-31 ENCOUNTER — Ambulatory Visit: Payer: BC Managed Care – PPO | Admitting: Podiatry

## 2021-06-19 ENCOUNTER — Other Ambulatory Visit: Payer: Self-pay | Admitting: Family Medicine

## 2021-07-17 ENCOUNTER — Other Ambulatory Visit: Payer: Self-pay | Admitting: Family Medicine

## 2021-07-21 ENCOUNTER — Encounter: Payer: Self-pay | Admitting: Family Medicine

## 2021-07-21 ENCOUNTER — Ambulatory Visit: Payer: BC Managed Care – PPO | Attending: Family Medicine | Admitting: Family Medicine

## 2021-07-21 VITALS — BP 132/78 | HR 96 | Ht 72.0 in | Wt 282.4 lb

## 2021-07-21 DIAGNOSIS — E1165 Type 2 diabetes mellitus with hyperglycemia: Secondary | ICD-10-CM

## 2021-07-21 DIAGNOSIS — M25561 Pain in right knee: Secondary | ICD-10-CM | POA: Diagnosis not present

## 2021-07-21 DIAGNOSIS — E1169 Type 2 diabetes mellitus with other specified complication: Secondary | ICD-10-CM

## 2021-07-21 DIAGNOSIS — M545 Low back pain, unspecified: Secondary | ICD-10-CM

## 2021-07-21 DIAGNOSIS — I152 Hypertension secondary to endocrine disorders: Secondary | ICD-10-CM

## 2021-07-21 DIAGNOSIS — E1142 Type 2 diabetes mellitus with diabetic polyneuropathy: Secondary | ICD-10-CM

## 2021-07-21 DIAGNOSIS — R29818 Other symptoms and signs involving the nervous system: Secondary | ICD-10-CM

## 2021-07-21 DIAGNOSIS — Z794 Long term (current) use of insulin: Secondary | ICD-10-CM

## 2021-07-21 DIAGNOSIS — E1159 Type 2 diabetes mellitus with other circulatory complications: Secondary | ICD-10-CM | POA: Diagnosis not present

## 2021-07-21 DIAGNOSIS — Z1159 Encounter for screening for other viral diseases: Secondary | ICD-10-CM

## 2021-07-21 DIAGNOSIS — G8929 Other chronic pain: Secondary | ICD-10-CM

## 2021-07-21 DIAGNOSIS — E785 Hyperlipidemia, unspecified: Secondary | ICD-10-CM

## 2021-07-21 LAB — POCT GLYCOSYLATED HEMOGLOBIN (HGB A1C): HbA1c, POC (controlled diabetic range): 6.9 % (ref 0.0–7.0)

## 2021-07-21 LAB — GLUCOSE, POCT (MANUAL RESULT ENTRY): POC Glucose: 104 mg/dl — AB (ref 70–99)

## 2021-07-21 MED ORDER — METFORMIN HCL 1000 MG PO TABS: ORAL_TABLET | ORAL | 1 refills | Status: DC

## 2021-07-21 MED ORDER — BASAGLAR KWIKPEN 100 UNIT/ML ~~LOC~~ SOPN: 20.0000 [IU] | PEN_INJECTOR | Freq: Every day | SUBCUTANEOUS | 6 refills | Status: DC

## 2021-07-21 MED ORDER — LISINOPRIL-HYDROCHLOROTHIAZIDE 20-12.5 MG PO TABS: 1.0000 | ORAL_TABLET | Freq: Every day | ORAL | 1 refills | Status: DC

## 2021-07-21 MED ORDER — DULOXETINE HCL 60 MG PO CPEP: 60.0000 mg | ORAL_CAPSULE | Freq: Every day | ORAL | 3 refills | Status: DC

## 2021-07-21 MED ORDER — AMLODIPINE BESYLATE 10 MG PO TABS: 10.0000 mg | ORAL_TABLET | Freq: Every day | ORAL | 1 refills | Status: DC

## 2021-07-21 MED ORDER — GLIPIZIDE ER 10 MG PO TB24: ORAL_TABLET | ORAL | 1 refills | Status: DC

## 2021-07-21 MED ORDER — DICLOFENAC SODIUM 75 MG PO TBEC: 75.0000 mg | DELAYED_RELEASE_TABLET | Freq: Two times a day (BID) | ORAL | 2 refills | Status: DC | PRN

## 2021-07-21 MED ORDER — OZEMPIC (2 MG/DOSE) 8 MG/3ML ~~LOC~~ SOPN: PEN_INJECTOR | SUBCUTANEOUS | 6 refills | Status: DC

## 2021-07-21 MED ORDER — ATORVASTATIN CALCIUM 40 MG PO TABS: 40.0000 mg | ORAL_TABLET | Freq: Every day | ORAL | 1 refills | Status: DC

## 2021-07-21 MED ORDER — EMPAGLIFLOZIN 25 MG PO TABS: 25.0000 mg | ORAL_TABLET | Freq: Every day | ORAL | 1 refills | Status: DC

## 2021-07-21 NOTE — Patient Instructions (Signed)
Diabetic Neuropathy Diabetic neuropathy refers to nerve damage that is caused by diabetes. Over time, people with diabetes can develop nerve damage throughout the body. There are several types of diabetic neuropathy: Peripheral neuropathy. This is the most common type of diabetic neuropathy. It damages the nerves that carry signals between the spinal cord and other parts of the body (peripheral nerves). This usually affects nerves in the feet, legs, hands, and arms. Autonomic neuropathy. This type causes damage to nerves that control involuntary functions (autonomic nerves). Involuntary functions are functions of the body that you do not control. They include heartbeat, body temperature, blood pressure, urination, digestion, sweating, sexual function, or response to changes in blood glucose. Focal neuropathy. This type of nerve damage affects one area of the body, such as an arm, a leg, or the face. The injury may involve one nerve or a small group of nerves. Focal neuropathy can be painful and unpredictable. It occurs most often in older adults with diabetes. This often develops suddenly, but usually improves over time and does not cause long-term problems. Proximal neuropathy. This type of nerve damage affects the nerves of the thighs, hips, buttocks, or legs. It causes severe pain, weakness, and muscle death (atrophy), usually in the thigh muscles. It is more common among older men and people who have type 2 diabetes. The length of recovery time may vary. What are the causes? Peripheral, autonomic, and focal neuropathies are caused by diabetes that is not well controlled with treatment. The cause of proximal neuropathy is not known, but it may be caused by inflammation related to uncontrolled blood glucose levels. What are the signs or symptoms? Peripheral neuropathy Peripheral neuropathy develops slowly over time. When the nerves of the feet and legs no longer work, you may experience: Burning,  stabbing, or aching pain in the legs or feet. Pain or cramping in the legs or feet. Loss of feeling (numbness) and inability to feel pressure or pain in the feet. This can lead to: Thick calluses or sores on areas of constant pressure. Ulcers. Reduced ability to feel temperature changes. Foot deformities. Muscle weakness. Loss of balance or coordination. Autonomic neuropathy The symptoms of autonomic neuropathy vary depending on which nerves are affected. Symptoms may include: Problems with digestion, such as: Nausea or vomiting. Poor appetite. Bloating. Diarrhea or constipation. Trouble swallowing. Losing weight without trying to. Problems with the heart, blood, and lungs, such as: Dizziness, especially when standing up. Fainting. Shortness of breath. Irregular heartbeat. Bladder problems, such as: Trouble starting or stopping urination. Leaking urine. Trouble emptying the bladder. Urinary tract infections (UTIs). Problems with other body functions, such as: Sweat. You may sweat too much or too little. Temperature. You might get hot easily. Or, you might feel cold more than usual. Sexual function. Men may not be able to get or maintain an erection. Women may have vaginal dryness and difficulty with arousal. Focal neuropathy Symptoms affect only one area of the body. Common symptoms include: Numbness. Tingling. Burning pain. Prickling feeling. Very sensitive skin. Weakness. Inability to move (paralysis). Muscle twitching. Muscles getting smaller (wasting). Poor coordination. Double or blurred vision. Proximal neuropathy Sudden, severe pain in the hip, thigh, or buttocks. Pain may spread from the back into the legs (sciatica). Pain and numbness in the arms and legs. Tingling. Loss of bladder control or bowel control. Weakness and wasting of thigh muscles. Difficulty getting up from a seated position. Abdominal swelling. Unexplained weight loss. How is this  diagnosed? Diagnosis varies depending on the type   of neuropathy your health care provider suspects. Peripheral neuropathy Your health care provider will do a neurologic exam. This exam checks your reflexes, how you move, and what you can feel. You may have other tests, such as: Blood tests. Tests of the fluid that surrounds the spinal cord (lumbar puncture). CT scan. MRI. Checking the nerves that control muscles (electromyogram, or EMG). Checking how quickly signals pass through your nerves (nerve conduction study). Checking a small piece of a nerve using a microscope (biopsy). Autonomic neuropathy You may have tests, such as: Tests to measure your blood pressure and heart rate. You may be secured to an exam table that moves you from a lying position to an upright position (table tilt test). Breathing tests to check your lungs. Tests to check how food moves through the digestive system (gastric emptying tests). Blood, sweat, or urine tests. Ultrasound of your bladder. Spinal fluid tests. Focal neuropathy This condition may be diagnosed with: A neurologic exam. CT scan. MRI. EMG. Nerve conduction study. Proximal neuropathy There is no test to diagnose this type of neuropathy. You may have tests to rule out other possible causes of this type of neuropathy. Tests may include: X-rays of your spine and lumbar region. Lumbar puncture. MRI. How is this treated? The goal of treatment is to keep nerve damage from getting worse. Treatment may include: Following your diabetes management plan. This will help keep your blood glucose level and your A1C level within your target range. This is the most important treatment. Using prescription pain medicine. Follow these instructions at home: Diabetes management Follow your diabetes management plan as told by your health care provider. Check your blood glucose levels. Keep your blood glucose in your target range. Have your A1C level checked at  least two times a year, or as often as told. Take over the counter and prescription medicines only as told by your health care provider. This includes insulin and diabetes medicine.  Lifestyle  Do not use any products that contain nicotine or tobacco, such as cigarettes, e-cigarettes, and chewing tobacco. If you need help quitting, ask your health care provider. Be physically active every day. Include strength training and balance exercises. Follow a healthy meal plan. Work with your health care provider to manage your blood pressure. General instructions Ask your health care provider if the medicine prescribed to you requires you to avoid driving or using machinery. Check your skin and feet every day for cuts, bruises, redness, blisters, or sores. Keep all follow-up visits. This is important. Contact a health care provider if: You have burning, stabbing, or aching pain in your legs or feet. You are unable to feel pressure or pain in your feet. You develop problems with digestion, such as: Nausea. Vomiting. Bloating. Constipation. Diarrhea. Abdominal pain. You have difficulty with urination, such as: Inability to control when you urinate (incontinence). Inability to completely empty the bladder (retention). You feel as if your heart is racing (palpitations). You feel dizzy, weak, or faint when you stand up. Get help right away if: You cannot urinate. You have sudden weakness or loss of coordination. You have trouble speaking. You have pain or pressure in your chest. You have an irregular heartbeat. You have sudden inability to move a part of your body. These symptoms may represent a serious problem that is an emergency. Do not wait to see if the symptoms will go away. Get medical help right away. Call your local emergency services (911 in the [place] Do not drive yourself to   the hospital. Summary Diabetic neuropathy is nerve damage that is caused by diabetes. It can cause numbness  and pain in the arms, legs, digestive tract, heart, and other body systems. This condition is treated by keeping your blood glucose level and your A1C level within your target range. This can help prevent neuropathy from getting worse. Check your skin and feet every day for cuts, bruises, redness, blisters, or sores. Do not use any products that contain nicotine or tobacco, such as cigarettes, e-cigarettes, and chewing tobacco. This information is not intended to replace advice given to you by your health care provider. Make sure you discuss any questions you have with your health care provider. Document Revised: 06/18/2019 Document Reviewed: 06/18/2019 Elsevier Patient Education  [place].

## 2021-07-21 NOTE — Progress Notes (Signed)
Subjective:  Patient ID: Anthony Vincent, male    DOB: [birth year]  Age: 51 y.o. MRN: [id number]  CC: Diabetes   HPI Anthony Vincent is a 51 y.o. year old male with a history of type 2 diabetes mellitus (A1c 6.9), hypertension, hyperlipidemia who presents today for follow-up visit.  Interval History: A1c is 6.9 down from 8.6 previously.  He does have diabetic neuropathy and is on gabapentin but continues to experience some tingling in the mid part of his sole of both feet which sometimes causes him to be off balance.  He has had no hypoglycemic episodes or visual concerns.  Today he complains of right knee pain which is rated as a 3/10 and it does not stop him from walking or going up stairs. He has Arthritis medications and creams which he is yet to use.   Chart reveals he was evaluated by orthopedic, Dr.Xu for left knee pain in 02/2021 and per notes there was suspicion for small meniscus tear which has improved and OTC NSAID recommended with consideration for cortisone injection if symptoms persisted.  He complains of chronic back pain worse when he wakes up and after he starts walking he feels better and this has been present daily.He has no numbness He also complains of somnolence which occurs when he is still at home and he has "an over rush of sleep" which lasts for 10 minutes then subsides. When he uses energy supplements he is able to control it. He endorses presence of sleep apnea. He has no headaches or fatigue. Past Medical History:  Diagnosis Date   Allergy    seasonal allergies   Diabetes mellitus without complication ([place])    on meds   Diabetic peripheral neuropathy ([place])    on meds   Hyperlipidemia    on meds   Hypertension    on meds   Neuromuscular disorder [facility])     Past Surgical History:  Procedure Laterality Date   WISDOM TOOTH EXTRACTION      Family History  Problem Relation Age of Onset   Diabetes Mother    Diabetes Father    Colon cancer Neg Hx    Colon  polyps Neg Hx    Esophageal cancer Neg Hx    Stomach cancer Neg Hx    Rectal cancer Neg Hx     Social History   Socioeconomic History   Marital status: Single    Spouse name: Not on file   Number of children: Not on file   Years of education: Not on file   Highest education level: Not on file  Occupational History   Not on file  Tobacco Use   Smoking status: Never   Smokeless tobacco: Never  Vaping Use   Vaping Use: Never used  Substance and Sexual Activity   Alcohol use: No   Drug use: No   Sexual activity: Not on file  Other Topics Concern   Not on file  Social History Narrative   Not on file   Social Determinants of Health   Financial Resource Strain: Not on file  Food Insecurity: Not on file  Transportation Needs: Not on file  Physical Activity: Not on file  Stress: Not on file  Social Connections: Not on file    No Known Allergies  Outpatient Medications Prior to Visit  Medication Sig Dispense Refill   gabapentin (NEURONTIN) 300 MG capsule Take orally 2 tablets (600 mg) in the morning and 3 tablets (900 mg) in the evening 150 capsule  6   Insulin Pen Needle (TRUEPLUS PEN NEEDLES) 32G X 4 MM MISC Use to inject Basaglar once daily. 100 each 11   methocarbamol (ROBAXIN) 500 MG tablet Take 2 tablets (1,000 mg total) by mouth every 8 (eight) hours as needed for muscle spasms. 60 tablet 1   sildenafil (VIAGRA) 50 MG tablet Take 1 tablet (50 mg total) by mouth daily as needed for erectile dysfunction. 20 tablet 1   amLODipine (NORVASC) 10 MG tablet Take 1 tablet (10 mg total) by mouth daily. 90 tablet 1   atorvastatin (LIPITOR) 40 MG tablet TAKE ONE TABLET BY MOUTH DAILY 30 tablet 0   diclofenac (VOLTAREN) 75 MG EC tablet Take 1 tablet (75 mg total) by mouth 2 (two) times daily as needed. 60 tablet 2   empagliflozin (JARDIANCE) 25 MG TABS tablet Take 1 tablet (25 mg total) by mouth daily. 90 tablet 1   glipiZIDE (GLUCOTROL XL) 10 MG 24 hr tablet TAKE 2 TABLETS BY  MOUTH DAILY WITH BREAKFAST 180 tablet 0   Insulin Glargine (BASAGLAR KWIKPEN) 100 UNIT/ML Inject 20 Units into the skin at bedtime. 30 mL 6   lisinopril-hydrochlorothiazide (ZESTORETIC) 20-12.5 MG tablet TAKE ONE TABLET BY MOUTH DAILY 90 tablet 1   metFORMIN (GLUCOPHAGE) 1000 MG tablet TAKE ONE TABLET BY MOUTH TWICE A DAY WITH MEALS 60 tablet 0   Semaglutide, 2 MG/DOSE, (OZEMPIC, 2 MG/DOSE,) 8 MG/3ML SOPN INJECT 2 MG AS DIRECTED ONCE A WEEK 3 mL 0   Facility-Administered Medications Prior to Visit  Medication Dose Route Frequency Provider Last Rate Last Admin   0.9 %  sodium chloride infusion  500 mL Intravenous Once Nelida Meuse III, MD         ROS Review of Systems  Constitutional:  Negative for activity change and appetite change.  HENT:  Negative for sinus pressure and sore throat.   Eyes:  Negative for visual disturbance.  Respiratory:  Negative for cough, chest tightness and shortness of breath.   Cardiovascular:  Negative for chest pain and leg swelling.  Gastrointestinal:  Negative for abdominal distention, abdominal pain, constipation and diarrhea.  Endocrine: Negative.   Genitourinary:  Negative for dysuria.  Musculoskeletal:        See HPI  Skin:  Negative for rash.  Allergic/Immunologic: Negative.   Neurological:  Positive for numbness. Negative for weakness and light-headedness.  Psychiatric/Behavioral:  Negative for dysphoric mood and suicidal ideas.    Objective:  BP 132/78   Pulse 96   Ht 6' (1.829 m)   Wt 282 lb 6.4 oz (128.1 kg)   SpO2 98%   BMI 38.30 kg/m      07/21/2021    4:25 PM 09/20/2020   10:30 AM 09/01/2020   10:07 AM  BP/Weight  Systolic BP 417  408  Diastolic BP 78  88  Wt. (Lbs) 282.4 287 284  BMI 38.3 kg/m2 38.92 kg/m2 38.51 kg/m2      Physical Exam Constitutional:      Appearance: He is well-developed.  Cardiovascular:     Rate and Rhythm: Normal rate.     Heart sounds: Normal heart sounds. No murmur heard. Pulmonary:     Effort:  Pulmonary effort is normal.     Breath sounds: Normal breath sounds. No wheezing or rales.  Chest:     Chest wall: No tenderness.  Abdominal:     General: Bowel sounds are normal. There is no distension.     Palpations: Abdomen is soft. There is no mass.  Tenderness: There is no abdominal tenderness.  Musculoskeletal:        General: Normal range of motion.     Right lower leg: No edema.     Left lower leg: No edema.     Comments: No tenderness on palpation of lumbar spine, negative straight leg raise bilaterally Normal appearance of both knees with no tenderness elicited on range of motion, no crepitus  Neurological:     Mental Status: He is alert and oriented to person, place, and time.  Psychiatric:        Mood and Affect: Mood normal.       Latest Ref Rng & Units 09/01/2020   10:53 AM 03/22/2020    9:13 AM 12/21/2019    9:14 AM  CMP  Glucose 65 - 99 mg/dL 167   169   95    BUN 6 - 24 mg/dL 11   10   9     Creatinine 0.76 - 1.27 mg/dL 1.08   1.19   1.01    Sodium 134 - 144 mmol/L 140   140   141    Potassium 3.5 - 5.2 mmol/L 4.4   4.0   4.0    Chloride 96 - 106 mmol/L 101   100   102    CO2 20 - 29 mmol/L 19   23   21     Calcium 8.7 - 10.2 mg/dL 9.7   9.8   9.3    Total Protein 6.0 - 8.5 g/dL 8.0   7.9     Total Bilirubin 0.0 - 1.2 mg/dL 0.9   0.9     Alkaline Phos 44 - 121 IU/L 94   93     AST 0 - 40 IU/L 20   17     ALT 0 - 44 IU/L 45   30       Lipid Panel     Component Value Date/Time   CHOL 108 09/01/2020 1053   TRIG 93 09/01/2020 1053   HDL 26 (L) 09/01/2020 1053   CHOLHDL 4.2 09/01/2020 1053   CHOLHDL 4.3 12/22/2015 1207   VLDL 11 12/22/2015 1207   LDLCALC 64 09/01/2020 1053    CBC    Component Value Date/Time   WBC 7.5 09/01/2020 1053   WBC 6.6 12/22/2015 1207   RBC 5.70 09/01/2020 1053   RBC 5.88 (H) 12/22/2015 1207   HGB 16.2 09/01/2020 1053   HCT 47.1 09/01/2020 1053   PLT 255 09/01/2020 1053   MCV 83 09/01/2020 1053   MCH 28.4 09/01/2020  1053   MCH 27.2 12/22/2015 1207   MCHC 34.4 09/01/2020 1053   MCHC 34.3 12/22/2015 1207   RDW 16.1 (H) 09/01/2020 1053   LYMPHSABS 2.3 09/01/2020 1053   MONOABS 462 12/22/2015 1207   EOSABS 0.1 09/01/2020 1053   BASOSABS 0.1 09/01/2020 1053    Lab Results  Component Value Date   HGBA1C 6.9 07/21/2021    Assessment & Plan:  1. Type 2 diabetes mellitus with other specified complication, with long-term current use of insulin (HCC) Controlled with A1c of 6.9 Goal A1c is less than 7.0 Continue current regimen Counseled on Diabetic diet, my plate method, 619 minutes of moderate intensity exercise/week Blood sugar logs with fasting goals of 80-120 mg/dl, random of less than 180 and in the event of sugars less than 60 mg/dl or greater than 400 mg/dl encouraged to notify the clinic. Advised on the need for annual eye exams, annual foot exams, Pneumonia  vaccine. - POCT glucose (manual entry) - POCT glycosylated hemoglobin (Hb A1C) - Microalbumin / creatinine urine ratio; Future - LP+Non-HDL Cholesterol; Future - CMP14+EGFR; Future - Insulin Glargine (BASAGLAR KWIKPEN) 100 UNIT/ML; Inject 20 Units into the skin at bedtime.  Dispense: 30 mL; Refill: 6 - Semaglutide, 2 MG/DOSE, (OZEMPIC, 2 MG/DOSE,) 8 MG/3ML SOPN; INJECT 2 MG AS DIRECTED ONCE A WEEK  Dispense: 3 mL; Refill: 6  2. Need for hepatitis C screening test - HCV Ab w Reflex to Quant PCR; Future  3. 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 For diabetic neuropathy  Dispense: 30 capsule; Refill: 3 - diclofenac (VOLTAREN) 75 MG EC tablet; Take 1 tablet (75 mg total) by mouth 2 (two) times daily as needed.  Dispense: 60 tablet; Refill: 2  4. Hyperlipidemia associated with type 2 diabetes mellitus (HCC) Last lipid panel was normal Low-cholesterol diet - atorvastatin (LIPITOR) 40 MG  tablet; Take 1 tablet (40 mg total) by mouth daily.  Dispense: 90 tablet; Refill: 1  5. Hypertension associated with diabetes ([place]) Controlled Counseled on blood pressure goal of less than 130/80, low-sodium, DASH diet, medication compliance, 150 minutes of moderate intensity exercise per week. Discussed medication compliance, adverse effects.  - amLODipine (NORVASC) 10 MG tablet; Take 1 tablet (10 mg total) by mouth daily.  Dispense: 90 tablet; Refill: 1 - lisinopril-hydrochlorothiazide (ZESTORETIC) 20-12.5 MG tablet; Take 1 tablet by mouth daily.  Dispense: 90 tablet; Refill: 1  6. Type 2 diabetes mellitus with diabetic polyneuropathy, with long-term current use of insulin (HCC) Neuropathy is uncontrolled Increase dose of gabapentin and Cymbalta added to regimen - glipiZIDE (GLUCOTROL XL) 10 MG 24 hr tablet; TAKE 2 TABLETS BY MOUTH DAILY WITH BREAKFAST  Dispense: 180 tablet; Refill: 1 - metFORMIN (GLUCOPHAGE) 1000 MG tablet; TAKE ONE TABLET BY MOUTH TWICE A DAY WITH MEALS  Dispense: 180 tablet; Refill: 1 - empagliflozin (JARDIANCE) 25 MG TABS tablet; Take 1 tablet (25 mg total) by mouth daily.  Dispense: 90 tablet; Refill: 1  7. Chronic pain of right knee Pain is minimal Advised to apply topical NSAID  8. Suspected sleep apnea Explain his daytime somnolence - Split night study; Future    Meds ordered this encounter  Medications   DULoxetine (CYMBALTA) 60 MG capsule    Sig: Take 1 capsule (60 mg total) by mouth daily. For diabetic neuropathy    Dispense:  30 capsule    Refill:  3   atorvastatin (LIPITOR) 40 MG tablet    Sig: Take 1 tablet (40 mg total) by mouth daily.    Dispense:  90 tablet    Refill:  1   amLODipine (NORVASC) 10 MG tablet    Sig: Take 1 tablet (10 mg total) by mouth daily.    Dispense:  90 tablet    Refill:  1   Insulin Glargine (BASAGLAR KWIKPEN) 100 UNIT/ML    Sig: Inject 20 Units into the skin at bedtime.    Dispense:  30 mL    Refill:  6    Dose  increase   lisinopril-hydrochlorothiazide (ZESTORETIC) 20-12.5 MG tablet    Sig: Take 1 tablet by mouth daily.    Dispense:  90 tablet    Refill:  1   glipiZIDE (GLUCOTROL XL) 10 MG 24 hr tablet    Sig: TAKE 2 TABLETS BY MOUTH DAILY WITH BREAKFAST    Dispense:  180 tablet    Refill:  1   metFORMIN (GLUCOPHAGE) 1000 MG tablet    Sig: TAKE ONE TABLET BY MOUTH TWICE A DAY WITH MEALS    Dispense:  180 tablet    Refill:  1   Semaglutide, 2 MG/DOSE, (OZEMPIC, 2 MG/DOSE,) 8 MG/3ML SOPN    Sig: INJECT 2 MG AS DIRECTED ONCE A WEEK    Dispense:  3 mL    Refill:  6    Must have office visit for refills   empagliflozin (JARDIANCE) 25 MG TABS tablet    Sig: Take 1 tablet (25 mg total) by mouth daily.    Dispense:  90 tablet    Refill:  1   diclofenac (VOLTAREN) 75 MG EC tablet    Sig: Take 1 tablet (75 mg total) by mouth 2 (two) times daily as needed.    Dispense:  60 tablet    Refill:  2    Follow-up: Return in about 6 months (around 01/20/2022) for Chronic medical conditions.     Visit required 46 minutes of patient care including median intraservice time reviewing previous notes and test results, counseling patient on diagnosis and work up of back and knee pain in addition to management of chronic medical conditions.Time also spent ordering medications, investigations and documenting in the chart.  All questions were answered to the patient's satisfaction   [name], MD, FAAFP. [facility] [place], [place]   07/21/2021, 5:07 PM

## 2021-07-21 NOTE — Progress Notes (Signed)
Having pain in right knee.

## 2021-08-01 ENCOUNTER — Other Ambulatory Visit: Payer: Self-pay | Admitting: Family

## 2021-08-01 DIAGNOSIS — E119 Type 2 diabetes mellitus without complications: Secondary | ICD-10-CM

## 2021-08-01 DIAGNOSIS — Z794 Long term (current) use of insulin: Secondary | ICD-10-CM

## 2021-08-01 DIAGNOSIS — E1142 Type 2 diabetes mellitus with diabetic polyneuropathy: Secondary | ICD-10-CM

## 2021-08-03 ENCOUNTER — Ambulatory Visit: Payer: BC Managed Care – PPO | Attending: Family Medicine

## 2021-08-03 ENCOUNTER — Other Ambulatory Visit: Payer: Self-pay

## 2021-08-03 DIAGNOSIS — E1169 Type 2 diabetes mellitus with other specified complication: Secondary | ICD-10-CM

## 2021-08-03 DIAGNOSIS — Z1159 Encounter for screening for other viral diseases: Secondary | ICD-10-CM

## 2021-08-04 LAB — MICROALBUMIN / CREATININE URINE RATIO
Creatinine, Urine: 120.2 mg/dL
Microalb/Creat Ratio: 21 mg/g creat (ref 0–29)
Microalbumin, Urine: 25.1 ug/mL

## 2021-08-04 LAB — CMP14+EGFR
ALT: 22 IU/L (ref 0–44)
AST: 11 IU/L (ref 0–40)
Albumin/Globulin Ratio: 1.6 (ref 1.2–2.2)
Albumin: 4.5 g/dL (ref 3.8–4.9)
Alkaline Phosphatase: 86 IU/L (ref 44–121)
BUN/Creatinine Ratio: 8 — ABNORMAL LOW (ref 9–20)
BUN: 8 mg/dL (ref 6–24)
Bilirubin Total: 0.6 mg/dL (ref 0.0–1.2)
CO2: 21 mmol/L (ref 20–29)
Calcium: 9.5 mg/dL (ref 8.7–10.2)
Chloride: 105 mmol/L (ref 96–106)
Creatinine, Ser: 1.04 mg/dL (ref 0.76–1.27)
Globulin, Total: 2.8 g/dL (ref 1.5–4.5)
Glucose: 108 mg/dL — ABNORMAL HIGH (ref 70–99)
Potassium: 4.5 mmol/L (ref 3.5–5.2)
Sodium: 145 mmol/L — ABNORMAL HIGH (ref 134–144)
Total Protein: 7.3 g/dL (ref 6.0–8.5)
eGFR: 86 mL/min/{1.73_m2} (ref >59)

## 2021-08-04 LAB — HCV INTERPRETATION

## 2021-08-04 LAB — HCV AB W REFLEX TO QUANT PCR: HCV Ab: NONREACTIVE

## 2021-08-04 LAB — LP+NON-HDL CHOLESTEROL
Cholesterol, Total: 88 mg/dL — ABNORMAL LOW (ref 100–199)
HDL: 27 mg/dL — ABNORMAL LOW (ref >39)
LDL Chol Calc (NIH): 46 mg/dL (ref 0–99)
Total Non-HDL-Chol (LDL+VLDL): 61 mg/dL (ref 0–129)
Triglycerides: 69 mg/dL (ref 0–149)
VLDL Cholesterol Cal: 15 mg/dL (ref 5–40)

## 2021-08-31 ENCOUNTER — Encounter (HOSPITAL_BASED_OUTPATIENT_CLINIC_OR_DEPARTMENT_OTHER): Payer: BC Managed Care – PPO | Admitting: Internal Medicine

## 2022-01-15 ENCOUNTER — Other Ambulatory Visit: Payer: Self-pay | Admitting: Family Medicine

## 2022-01-15 DIAGNOSIS — E1159 Type 2 diabetes mellitus with other circulatory complications: Secondary | ICD-10-CM

## 2022-01-15 DIAGNOSIS — Z794 Long term (current) use of insulin: Secondary | ICD-10-CM

## 2022-01-24 ENCOUNTER — Other Ambulatory Visit: Payer: Self-pay | Admitting: Pharmacist

## 2022-01-24 ENCOUNTER — Ambulatory Visit: Payer: Self-pay | Admitting: *Deleted

## 2022-01-24 NOTE — Telephone Encounter (Signed)
Summary: Pt requests that a nurse return his call regarding Rx for [place].   Pt requests that a nurse return his call regarding Rx for [place]. Cb# [phone]      Called patient to review medication request/ problem. Patient reports he received letter from [employer] they will no longer support coverage of Basaglar insulin after Jan. 1, 2024 per benefits. Recommended patient to contact insurance and ask which medications are covered or where medication may be covered. Please advise .    Reason for Disposition  [1] Caller has URGENT medicine question about med that PCP or specialist prescribed AND [2] triager unable to answer question  Answer Assessment - Initial Assessment Questions 1. NAME of MEDICINE: "What medicine(s) are you calling about?"     Basaglar kwikpen insulin 2. QUESTION: "What is your question?" (e.g., double dose of medicine, side effect)     What medication can be prescribe or what can be done to stay on medication due to patient's pharmacy [facility] send letter medication will no longer be covered.  3. PRESCRIBER: "Who prescribed the medicine?" Reason: if prescribed by specialist, call should be referred to that group.     PCP 4. SYMPTOMS: "Do you have any symptoms?" If Yes, ask: "What symptoms are you having?"  "How bad are the symptoms (e.g., mild, moderate, severe)     no 5. PREGNANCY:  "Is there any chance that you are pregnant?" "When was your last menstrual period?"     na  Protocols used: Medication Question Call-A-AH

## 2022-01-29 ENCOUNTER — Telehealth: Payer: Self-pay

## 2022-01-29 DIAGNOSIS — G8929 Other chronic pain: Secondary | ICD-10-CM

## 2022-01-29 NOTE — Telephone Encounter (Unsigned)
Copied from [place] [phone]. Topic: Referral - Request for Referral >> Jan 29, 2022  1:28 PM Leilani Able wrote: Has patient seen PCP for this complaint? Yes.   *If NO, is insurance requiring patient see PCP for this issue before PCP can refer them? Referral for which specialty: Rehab Preferred provider/office: Pt already had referral but failed to fu and closed/PT requesting new referral Reason for referral: duplicate of last referral

## 2022-01-30 ENCOUNTER — Other Ambulatory Visit: Payer: Self-pay | Admitting: Family

## 2022-01-30 DIAGNOSIS — E119 Type 2 diabetes mellitus without complications: Secondary | ICD-10-CM

## 2022-01-30 DIAGNOSIS — E1165 Type 2 diabetes mellitus with hyperglycemia: Secondary | ICD-10-CM

## 2022-01-30 NOTE — Telephone Encounter (Signed)
Pt has been informed of referral being placed.

## 2022-01-30 NOTE — Telephone Encounter (Signed)
Requested medication (s) are due for refill today - expired Rx  Requested medication (s) are on the active medication list -yes  Future visit scheduled -yes  Last refill: 09/01/20  Notes to clinic: expired Rx  Requested Prescriptions  Pending Prescriptions Disp Refills   BD PEN NEEDLE NANO 2ND GEN 32G X 4 MM MISC [Pharmacy Med Name: BD NANO 2 GEN PEN NDL 32G 4MM] 100 each 11    Sig: USE DAILY WITH [place]     Endocrinology: Diabetes - Testing Supplies Passed - 01/30/2022  1:53 PM      Passed - Valid encounter within last 12 months    Recent Outpatient Visits           6 months ago Type 2 diabetes mellitus with other specified complication, with long-term current use of insulin ([place])   [place], Farrell, MD   1 year ago Type 2 diabetes mellitus with hyperglycemia, with long-term current use of insulin ([place])   [place], [place], MD   1 year ago Type 2 diabetes mellitus without complication, with long-term current use of insulin [facility])   Primary Care at [facility], [place], PA-C   1 year ago Type 2 diabetes mellitus with hyperglycemia, with long-term current use of insulin ([place])   [place], Charlane Ferretti, MD   2 years ago Type 2 diabetes mellitus with hyperglycemia, with long-term current use of insulin ([place])   [place], Enobong, MD       Future Appointments             In 2 months [name], MD [place]               Requested Prescriptions  Pending Prescriptions Disp Refills   BD PEN NEEDLE NANO 2ND GEN 32G X 4 MM MISC [Pharmacy Med Name: BD NANO 2 GEN PEN NDL 32G 4MM] 100 each 11    Sig: USE [place]     Endocrinology: Diabetes - Testing Supplies Passed - 01/30/2022  1:53 PM      Passed - Valid encounter within last 12 months    Recent Outpatient  Visits           6 months ago Type 2 diabetes mellitus with other specified complication, with long-term current use of insulin ([place])   [place], [place], MD   1 year ago Type 2 diabetes mellitus with hyperglycemia, with long-term current use of insulin ([place])   [place], [place], MD   1 year ago Type 2 diabetes mellitus without complication, with long-term current use of insulin [facility])   Primary Care at [facility], [place], PA-C   1 year ago Type 2 diabetes mellitus with hyperglycemia, with long-term current use of insulin ([place])   [place], Charlane Ferretti, MD   2 years ago Type 2 diabetes mellitus with hyperglycemia, with long-term current use of insulin ([place])   [place], Enobong, MD       Future Appointments             In 2 months [name], MD [place]

## 2022-01-30 NOTE — Telephone Encounter (Signed)
Referral has been placed. 

## 2022-02-14 ENCOUNTER — Encounter: Payer: Self-pay | Admitting: Physical Therapy

## 2022-02-14 ENCOUNTER — Ambulatory Visit: Payer: BC Managed Care – PPO | Attending: Family Medicine | Admitting: Physical Therapy

## 2022-02-14 ENCOUNTER — Other Ambulatory Visit: Payer: Self-pay

## 2022-02-14 DIAGNOSIS — M545 Low back pain, unspecified: Secondary | ICD-10-CM | POA: Diagnosis not present

## 2022-02-14 DIAGNOSIS — M6281 Muscle weakness (generalized): Secondary | ICD-10-CM | POA: Diagnosis present

## 2022-02-14 DIAGNOSIS — G8929 Other chronic pain: Secondary | ICD-10-CM | POA: Insufficient documentation

## 2022-02-14 DIAGNOSIS — M5459 Other low back pain: Secondary | ICD-10-CM | POA: Insufficient documentation

## 2022-02-14 NOTE — Patient Instructions (Addendum)
Access Code: [email or web address] URL: [email or web address] Date: 02/14/2022 Prepared by: Hilda Blades  Exercises - Supine Pelvic Tilt  - 1-2 x daily - 1 sets - 10 reps - Supine Bridge with Spinal Articulation  - 1-2 x daily - 1 sets - 5-10 reps - Sidelying Thoracic Lumbar Rotation  - 1-2 x daily - 10 reps - Seated Pelvic Tilt  - 1-2 x daily - 1 sets - 10 reps - Seated Hamstring Stretch  - 1-2 x daily - 3 reps - 20 seconds hold

## 2022-02-14 NOTE — Therapy (Unsigned)
OUTPATIENT PHYSICAL THERAPY EVALUATION   Patient Name: Anthony Vincent MRN: [id number] DOB:March 21, 1970, 51 y.o., male Today's Date: 02/15/2022   END OF SESSION:  PT End of Session - 02/14/22 1621     Visit Number 1    Number of Visits 9    Date for PT Re-Evaluation 04/11/22    Authorization Type BCBS    PT Start Time 1611    PT Stop Time 1655    PT Time Calculation (min) 44 min    Activity Tolerance Patient tolerated treatment well    Behavior During Therapy WFL for tasks assessed/performed             Past Medical History:  Diagnosis Date   Allergy    seasonal allergies   Diabetes mellitus without complication ([place])    on meds   Diabetic peripheral neuropathy ([place])    on meds   Hyperlipidemia    on meds   Hypertension    on meds   Neuromuscular disorder ([place])    Past Surgical History:  Procedure Laterality Date   WISDOM TOOTH EXTRACTION     Patient Active Problem List   Diagnosis Date Noted   Right shoulder pain 09/01/2020   Hyperlipidemia 10/01/2017   Type 2 diabetes mellitus without complication, with long-term current use of insulin ([place]) 03/27/2017   Colon cancer screening 12/10/2016   Erectile dysfunction 08/15/2016   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 He feels it more in the morning when he gets out of bed and sitting up, he has to brace himself and push himself up. He is eventually able  to get to walking and still feels the pain and it will linger around. He also notes pain if he sits for a while especially when slouching on the couch. The pain makes it harder to get up from a chair or out of bed from sleeping or napping. Patient states he feels most comfortable lying on back and left side. Patient denies any radiating or referred pain, no numbness or tingling.   PERTINENT HISTORY:  DM with neuropathy, HTN  PAIN:  Are you having pain? Yes:  NPRS scale: 0/10 (up to 7-8/10 at worst) Pain location: Low back Pain description: "strong" pain, "not sharp" Aggravating factors: Getting up after lying down or sitting position Relieving factors: Nothing, pain will subside with walking  PRECAUTIONS: None  WEIGHT BEARING RESTRICTIONS: No  FALLS:  Has patient fallen in last 6 months? No  OCCUPATION: Works from home, sitting majority of time  PLOF: Independent  PATIENT GOALS: Pain relief when getting up in the morning or standing from chair   OBJECTIVE:  PATIENT SURVEYS:  FOTO 57% functional status  SCREENING FOR RED FLAGS: Negative  COGNITION: Overall cognitive status: Within functional limits for tasks assessed     SENSATION: WFL  MUSCLE LENGTH: Bilateral hamstring deficit  POSTURE:   Slightly reduced lumbar lordosis  PALPATION: Patient with mild tenderness to  bilateral lumbar paraspinals and hypomobility with PAIVMs of lumbar region  LUMBAR ROM:   AROM eval  Flexion 75%  Extension 75%  Right lateral flexion 75%  Left lateral flexion 75%  Right rotation 75%  Left rotation 75%    Patient reports low back twinge with extension  LOWER EXTREMITY ROM:      Grossly WFL and non-painful   LOWER EXTREMITY MMT:    MMT Right eval Left eval  Hip flexion 5 5  Hip extension 4 4  Hip abduction 4 4  Knee flexion 5 5  Knee extension 5 5   (Blank rows = not tested)   Myatomal testing negative  LUMBAR SPECIAL TESTS:  Radicular testing  negative  FUNCTIONAL TESTS:  Not assessed  GAIT: Assistive device utilized: None Level of assistance: Complete Independence Comments: Grossly WFL   TODAY'S TREATMENT:         [facility] Adult PT Treatment:                                                DATE: 02/14/2022 Therapeutic Exercise: Supine pelvic tilt x 10 Supine bridge x 10  - spinal articulation Sidelying thoracolumbar rotation x 10 each Seated pelvic tilt x 10 Seated hamstring stretch 2 x 20 sec each  PATIENT EDUCATION:  Education details: Exam findings, POC, HEP Person educated: Patient Education method: Explanation, Demonstration, Tactile cues, Verbal cues, and Handouts Education comprehension: verbalized understanding, returned demonstration, verbal cues required, tactile cues required, and needs further education  HOME EXERCISE PROGRAM: Access Code: [email or web address]    ASSESSMENT: CLINICAL IMPRESSION: Patient is a 51 y.o. male who was seen today for physical therapy evaluation and treatment for chronic nonspecific low back pain. Patient demonstrates slight limitations with lumbar motion and pain reported with extension, limited mobility of the lumbar region, strength deficits of the hip and core region, postural deviations, and pain with movement if he has been lying or seated for extended periods of time.   OBJECTIVE IMPAIRMENTS: decreased activity tolerance, decreased ROM, decreased strength, impaired flexibility, postural dysfunction, and pain.   ACTIVITY LIMITATIONS: lifting, sitting, transfers, and bed mobility  PARTICIPATION LIMITATIONS: driving and occupation  PERSONAL FACTORS: Fitness, Past/current experiences, and Time since onset of injury/illness/exacerbation are also affecting patient's functional outcome.   REHAB POTENTIAL: Good  CLINICAL DECISION MAKING: Stable/uncomplicated  EVALUATION COMPLEXITY: Low   GOALS: Goals reviewed with patient? Yes  SHORT TERM GOALS: Target date: 03/13/2022  Patient  will be I with initial HEP in order to progress with therapy. Baseline: HEP provided at eval Goal status: INITIAL  2.  PT will review FOTO with patient by 3rd visit in order to understand expected progress and outcome with therapy. Baseline: FOTO assessed at eval Goal status: INITIAL  3.  Patient will report low back pain when getting up in the morning </= 5/10 in order to improve mobility and reduce functional limitations Baseline: 7-8/10 pain Goal status: INITIAL  LONG TERM GOALS: Target date: 04/10/2022  Patient will be I with final HEP to maintain progress from PT. Baseline: HEP provided at eval Goal status: INITIAL  2.  Patient will report >/= 66% status on FOTO to indicate improved functional ability. Baseline: 57% functional status Goal status: INITIAL  3.  Patient will demonstrates lumbar AROM WFL and no pain with movement in order to improve mobility and reduce pain after lying  for extended periods. Baseline: patient demonstrates limitations listed above Goal status: INITIAL  4.  Patient will exhibit hip and core strength grossly >/= 4+/5 MMT in order to improve lumbar control and lifting ability Baseline: grossly 4/5 MMT Goal status: INITIAL   PLAN: PT FREQUENCY: 1-2x/week  PT DURATION: 8 weeks  PLANNED INTERVENTIONS: Therapeutic exercises, Therapeutic activity, Neuromuscular re-education, Balance training, Gait training, Patient/Family education, Self Care, Joint mobilization, Joint manipulation, Aquatic Therapy, Dry Needling, Spinal manipulation, Spinal mobilization, Cryotherapy, Moist heat, Manual therapy, and Re-evaluation.  PLAN FOR NEXT SESSION: Review HEP and progress PRN, manual/stretching for lumbar mobility, progress core and hip strengthening as tolerated   Hilda Blades, PT, DPT, LAT, ATC 02/15/22  8:30 AM Phone: [phone] Fax: [phone]

## 2022-02-15 ENCOUNTER — Other Ambulatory Visit: Payer: Self-pay | Admitting: Family Medicine

## 2022-02-15 DIAGNOSIS — I152 Hypertension secondary to endocrine disorders: Secondary | ICD-10-CM

## 2022-02-15 DIAGNOSIS — E1169 Type 2 diabetes mellitus with other specified complication: Secondary | ICD-10-CM

## 2022-02-15 DIAGNOSIS — E1159 Type 2 diabetes mellitus with other circulatory complications: Secondary | ICD-10-CM

## 2022-02-28 NOTE — Therapy (Signed)
OUTPATIENT PHYSICAL THERAPY TREATMENT NOTE   Patient Name: Anthony Vincent MRN: [id number] DOB:1970/06/25, 52 y.o., male Today's Date: 03/01/2022  PCP: [name], MD REFERRING PROVIDER: [name], MD   END OF SESSION:   PT End of Session - 03/01/22 1622     Visit Number 2    Number of Visits 9    Date for PT Re-Evaluation 04/11/22    Authorization Type BCBS    PT Start Time 1615    PT Stop Time 1655    PT Time Calculation (min) 40 min    Activity Tolerance Patient tolerated treatment well    Behavior During Therapy WFL for tasks assessed/performed             Past Medical History:  Diagnosis Date   Allergy    seasonal allergies   Diabetes mellitus without complication ([place])    on meds   Diabetic peripheral neuropathy ([place])    on meds   Hyperlipidemia    on meds   Hypertension    on meds   Neuromuscular disorder [facility])    Past Surgical History:  Procedure Laterality Date   WISDOM TOOTH EXTRACTION     Patient Active Problem List   Diagnosis Date Noted   Right shoulder pain 09/01/2020   Hyperlipidemia 10/01/2017   Type 2 diabetes mellitus without complication, with long-term current use of insulin ([place]) 03/27/2017   Colon cancer screening 12/10/2016   Erectile dysfunction 08/15/2016   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 He states when lying or sitting he is fine, but when he gets up and moves he will have the constant pain.    PAIN:  Are you having pain? Yes:  NPRS scale: 0/10 (up to 7-8/10 at worst) Pain location: Low back Pain description: "strong" pain, "not sharp" Aggravating factors: Getting up after lying down or sitting position Relieving factors: Nothing, pain will subside with walking   OBJECTIVE: (objective measures completed at initial evaluation unless otherwise dated) PATIENT SURVEYS:  FOTO 57% functional status   MUSCLE LENGTH: Bilateral hamstring deficit   POSTURE:             Slightly reduced lumbar lordosis   PALPATION: Patient with mild tenderness to bilateral lumbar paraspinals and hypomobility with PAIVMs of lumbar region   LUMBAR ROM:    AROM eval  Flexion 75%  Extension 75%  Right lateral flexion 75%  Left lateral flexion 75%  Right rotation 75%  Left rotation 75%                        Patient reports low back twinge with extension  LOWER EXTREMITY MMT:     MMT Right eval Left eval Rt / Lt 03/01/2022  Hip flexion 5 5   Hip extension 4 4   Hip abduction '4 4 4 '$ / 4  Knee flexion 5 5   Knee extension 5 5    (Blank  rows = not tested)  FUNCTIONAL TESTS:  Not assessed     TODAY'S TREATMENT:         [facility] Adult PT Treatment:                                                DATE: 03/01/2022 Therapeutic Exercise: NuStep L7 x 5 min with UE/LE while taking subjective Seated hamstring stretch 2 x 20 each LTR x 10 Piriformis stretch 2 x 20 sec each Supine pelvic tilt x 10 Supine pelvic tilt with march x 10 Supine bridge x 10 - spinal articulation Sidelying hip abduction 2 x 10 each Sidelying thoracolumbar rotation x 5 each Cat cow x 10 Child's pose 2 x 20 sec Prone press up x 10 Sit to stand 2 x 10   [facility] Adult PT Treatment:                                                DATE: 02/14/2022 Therapeutic Exercise: Supine pelvic tilt x 10 Supine bridge x 10  - spinal articulation Sidelying thoracolumbar rotation x 10 each Seated pelvic tilt x 10 Seated hamstring  stretch 2 x 20 sec each   PATIENT EDUCATION:  Education details: HEP update Person educated: Patient Education method: Explanation, Demonstration, Tactile cues, Verbal cues, Handout Education comprehension: verbalized understanding, returned demonstration, verbal cues required, tactile cues required, and needs further education   HOME EXERCISE PROGRAM: Access Code: [email or web address]      ASSESSMENT: CLINICAL IMPRESSION: Patient tolerated therapy well with no adverse effects. Therapy focused on progress lumbar mobility and core and hip strengthening with good tolerance. He does continue to report occasional lumbar twinges and requires cueing for proper core activation and lumbopelvic control with exercises. He continues to exhibit gross hip strength deficits. Updated HEP to progress core and hip strengthening. Patient would benefit from continued skilled PT to progress his mobility and strength in order to reduce pain and maximize functional ability.     OBJECTIVE IMPAIRMENTS: decreased activity tolerance, decreased ROM, decreased strength, impaired flexibility, postural dysfunction, and pain.    ACTIVITY LIMITATIONS: lifting, sitting, transfers, and bed mobility   PARTICIPATION LIMITATIONS: driving and occupation   PERSONAL FACTORS: Fitness, Past/current experiences, and Time since onset of injury/illness/exacerbation are also affecting patient's functional outcome.      GOALS: Goals reviewed with patient? Yes   SHORT TERM GOALS: Target date: 03/13/2022   Patient will be I with initial HEP in order to progress with therapy. Baseline: HEP provided at eval Goal status: INITIAL   2.  PT will review FOTO with patient by 3rd visit in order to understand expected progress and outcome with therapy. Baseline: FOTO assessed at eval Goal status: INITIAL   3.  Patient will report low back pain when getting up in the morning </= 5/10 in order to improve mobility and reduce functional  limitations Baseline: 7-8/10 pain Goal status: INITIAL   LONG TERM GOALS: Target date: 04/10/2022   Patient will be I with final HEP to maintain progress from PT. Baseline: HEP provided at eval Goal status: INITIAL   2.  Patient will report >/= 66% status on FOTO to indicate improved functional ability. Baseline: 57% functional status Goal status: INITIAL  3.  Patient will demonstrates lumbar AROM WFL and no pain with movement in order to improve mobility and reduce pain after lying for extended periods. Baseline: patient demonstrates limitations listed above Goal status: INITIAL   4.  Patient will exhibit hip and core strength grossly >/= 4+/5 MMT in order to improve lumbar control and lifting ability Baseline: grossly 4/5 MMT Goal status: INITIAL     PLAN: PT FREQUENCY: 1-2x/week   PT DURATION: 8 weeks   PLANNED INTERVENTIONS: Therapeutic exercises, Therapeutic activity, Neuromuscular re-education, Balance training, Gait training, Patient/Family education, Self Care, Joint mobilization, Joint manipulation, Aquatic Therapy, Dry Needling, Spinal manipulation, Spinal mobilization, Cryotherapy, Moist heat, Manual therapy, and Re-evaluation.   PLAN FOR NEXT SESSION: Review HEP and progress PRN, manual/stretching for lumbar mobility, progress core and hip strengthening as tolerated   Hilda Blades, PT, DPT, LAT, ATC 03/01/22  5:06 PM Phone: [phone] Fax: [phone]

## 2022-03-01 ENCOUNTER — Encounter: Payer: Self-pay | Admitting: Physical Therapy

## 2022-03-01 ENCOUNTER — Other Ambulatory Visit: Payer: Self-pay

## 2022-03-01 ENCOUNTER — Ambulatory Visit: Payer: BC Managed Care – PPO | Attending: Family Medicine | Admitting: Physical Therapy

## 2022-03-01 DIAGNOSIS — M6281 Muscle weakness (generalized): Secondary | ICD-10-CM

## 2022-03-01 DIAGNOSIS — M5459 Other low back pain: Secondary | ICD-10-CM

## 2022-03-01 NOTE — Patient Instructions (Signed)
Access Code: [email or web address] URL: [email or web address] Date: 03/01/2022 Prepared by: Hilda Blades  Exercises - Supine Pelvic Tilt  - 1-2 x daily - 1 sets - 10 reps - Supine March with Posterior Pelvic Tilt  - 1-2 x daily - 10 reps - Supine Bridge with Spinal Articulation  - 1-2 x daily - 2 sets - 10 reps - Sidelying Thoracic Lumbar Rotation  - 1-2 x daily - 10 reps - Sidelying Hip Abduction  - 1 x daily - 2 sets - 10 reps - Seated Pelvic Tilt  - 1-2 x daily - 1 sets - 10 reps - Seated Hamstring Stretch  - 1-2 x daily - 3 reps - 20 seconds hold

## 2022-03-04 ENCOUNTER — Other Ambulatory Visit: Payer: Self-pay | Admitting: Family Medicine

## 2022-03-04 DIAGNOSIS — E1169 Type 2 diabetes mellitus with other specified complication: Secondary | ICD-10-CM

## 2022-03-05 NOTE — Telephone Encounter (Signed)
Requested Prescriptions  Pending Prescriptions Disp Refills   atorvastatin (LIPITOR) 40 MG tablet [Pharmacy Med Name: ATORVASTATIN 40 MG TABLET] 90 tablet 1    Sig: TAKE 1 TABLET BY MOUTH DAILY     Cardiovascular:  Antilipid - Statins Failed - 03/04/2022  3:08 PM      Failed - Lipid Panel in normal range within the last 12 months    Cholesterol, Total  Date Value Ref Range Status  08/03/2021 88 (L) 100 - 199 mg/dL Final   LDL Chol Calc (NIH)  Date Value Ref Range Status  08/03/2021 46 0 - 99 mg/dL Final   HDL  Date Value Ref Range Status  08/03/2021 27 (L) >39 mg/dL Final   Triglycerides  Date Value Ref Range Status  08/03/2021 69 0 - 149 mg/dL Final         Passed - Patient is not pregnant      Passed - Valid encounter within last 12 months    Recent Outpatient Visits           7 months ago Type 2 diabetes mellitus with other specified complication, with long-term current use of insulin ([place])   [place], [place], MD   1 year ago Type 2 diabetes mellitus with hyperglycemia, with long-term current use of insulin ([place])   [place], [place], MD   1 year ago Type 2 diabetes mellitus without complication, with long-term current use of insulin [facility])   Primary Care at [facility], [place], PA-C   1 year ago Type 2 diabetes mellitus with hyperglycemia, with long-term current use of insulin ([place])   [place], Charlane Ferretti, MD   2 years ago Type 2 diabetes mellitus with hyperglycemia, with long-term current use of insulin [facility])   [place], [place]obong, MD       Future Appointments             In 1 month Roland, Dionne Bucy, PA-C [place]

## 2022-03-07 NOTE — Therapy (Signed)
OUTPATIENT PHYSICAL THERAPY TREATMENT NOTE   Patient Name: Anthony Vincent MRN: [id number] DOB:Aug 06, 1970, 52 y.o., male Today's Date: 03/08/2022  PCP: [name], MD REFERRING PROVIDER: [name], MD   END OF SESSION:   PT End of Session - 03/08/22 1616     Visit Number 3    Number of Visits 9    Date for PT Re-Evaluation 04/11/22    Authorization Type BCBS    PT Start Time 1612    PT Stop Time 1653    PT Time Calculation (min) 41 min    Activity Tolerance Patient tolerated treatment well    Behavior During Therapy WFL for tasks assessed/performed              Past Medical History:  Diagnosis Date   Allergy    seasonal allergies   Diabetes mellitus without complication ([place])    on meds   Diabetic peripheral neuropathy ([place])    on meds   Hyperlipidemia    on meds   Hypertension    on meds   Neuromuscular disorder [facility])    Past Surgical History:  Procedure Laterality Date   WISDOM TOOTH EXTRACTION     Patient Active Problem List   Diagnosis Date Noted   Right shoulder pain 09/01/2020   Hyperlipidemia 10/01/2017   Type 2 diabetes mellitus without complication, with long-term current use of insulin ([place]) 03/27/2017   Colon cancer screening 12/10/2016   Erectile dysfunction 08/15/2016   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 There were a few days earlier in the week where he didn't have much pain at all.    PAIN:  Are you having pain? Yes:  NPRS scale: 0/10 (up to 7-8/10 at worst) Pain location: Low back Pain description: "strong" pain, "not sharp" Aggravating factors: Getting up after lying down or sitting position Relieving factors: Nothing, pain will subside with walking   OBJECTIVE: (objective measures completed at initial evaluation unless otherwise dated) PATIENT SURVEYS:  FOTO 57% functional status   MUSCLE LENGTH: Bilateral hamstring deficit   POSTURE:             Slightly reduced lumbar lordosis   PALPATION: Patient with mild tenderness to bilateral lumbar paraspinals and hypomobility with PAIVMs of lumbar region 03/08/2022: PAIVMs of lumbar grossly WFL and non-painful, no lumbar paraspinal tenderness noted   LUMBAR ROM:    AROM eval  Flexion 75%  Extension 75%  Right lateral flexion 75%  Left lateral flexion 75%  Right rotation 75%  Left rotation 75%                        Patient reports low back twinge with extension  LOWER EXTREMITY MMT:     MMT Right eval Left eval Rt / Lt 03/01/2022  Hip flexion 5 5   Hip extension 4 4   Hip abduction '4 4 4 '$ / 4  Knee flexion 5 5  Knee extension 5 5    (Blank rows = not tested)  FUNCTIONAL TESTS:  Not assessed     TODAY'S TREATMENT:         [facility] Adult PT Treatment:                                                DATE: 03/08/2022 Therapeutic Exercise: NuStep L7 x 5 min with UE/LE while taking subjective Seated hamstring stretch 2 x 20 each Seated physioball rollout x 5 fwd, x 2 lateral LTR x 5 each Piriformis stretch push/pull 2 x 20 sec each Prone quad stretch 2 x 20 sec Prone hip extension 2 x 10 each Prone lumbar extension 2 x 10 Bridge with ball squeeze 2 x 10 90-90 abdominal set with ball squeeze 2 x 10 Sidelying hip abduction 2 x 15 each Sit to stand 2 x 10   [facility] Adult PT Treatment:                                                DATE: 03/01/2022 Therapeutic Exercise: NuStep L7 x 5 min with UE/LE  while taking subjective Seated hamstring stretch 2 x 20 each LTR x 10 Piriformis stretch 2 x 20 sec each Supine pelvic tilt x 10 Supine pelvic tilt with march x 10 Supine bridge x 10 - spinal articulation Sidelying hip abduction 2 x 10 each Sidelying thoracolumbar rotation x 5 each Cat cow x 10 Child's pose 2 x 20 sec Prone press up x 10 Sit to stand 2 x 10  [facility] Adult PT Treatment:                                                DATE: 02/14/2022 Therapeutic Exercise: Supine pelvic tilt x 10 Supine bridge x 10  - spinal articulation Sidelying thoracolumbar rotation x 10 each Seated pelvic tilt x 10 Seated hamstring stretch 2 x 20 sec each   PATIENT EDUCATION:  Education details: HEP Person educated: Patient Education method: Consulting civil engineer, Demonstration, Corporate treasurer cues, Verbal cues Education comprehension: verbalized understanding, returned demonstration, verbal cues required, tactile cues required, and needs further education   HOME EXERCISE PROGRAM: Access Code: [email or web address]      ASSESSMENT: CLINICAL IMPRESSION: Patient tolerated therapy well with no adverse effects. Therapy continues to focus primarily on progressing his core and hip strengthening. He was able to progress with core strengthening well without any report in low back pain. He does report left hip strengthening exercises are more difficulty. No changes made to HEP this visit. Patient would benefit from continued skilled PT to progress his mobility and strength in order to reduce pain and maximize functional ability.     OBJECTIVE IMPAIRMENTS: decreased activity tolerance, decreased ROM, decreased strength, impaired flexibility, postural dysfunction, and pain.    ACTIVITY LIMITATIONS: lifting, sitting, transfers, and bed mobility   PARTICIPATION LIMITATIONS: driving and occupation   PERSONAL FACTORS: Fitness, Past/current experiences, and Time since onset of injury/illness/exacerbation are also affecting patient's  functional outcome.      GOALS: Goals reviewed with patient? Yes   SHORT TERM GOALS: Target  date: 03/13/2022   Patient will be I with initial HEP in order to progress with therapy. Baseline: HEP provided at eval Goal status: INITIAL   2.  PT will review FOTO with patient by 3rd visit in order to understand expected progress and outcome with therapy. Baseline: FOTO assessed at eval Goal status: INITIAL   3.  Patient will report low back pain when getting up in the morning </= 5/10 in order to improve mobility and reduce functional limitations Baseline: 7-8/10 pain Goal status: INITIAL   LONG TERM GOALS: Target date: 04/10/2022   Patient will be I with final HEP to maintain progress from PT. Baseline: HEP provided at eval Goal status: INITIAL   2.  Patient will report >/= 66% status on FOTO to indicate improved functional ability. Baseline: 57% functional status Goal status: INITIAL   3.  Patient will demonstrates lumbar AROM WFL and no pain with movement in order to improve mobility and reduce pain after lying for extended periods. Baseline: patient demonstrates limitations listed above Goal status: INITIAL   4.  Patient will exhibit hip and core strength grossly >/= 4+/5 MMT in order to improve lumbar control and lifting ability Baseline: grossly 4/5 MMT Goal status: INITIAL     PLAN: PT FREQUENCY: 1-2x/week   PT DURATION: 8 weeks   PLANNED INTERVENTIONS: Therapeutic exercises, Therapeutic activity, Neuromuscular re-education, Balance training, Gait training, Patient/Family education, Self Care, Joint mobilization, Joint manipulation, Aquatic Therapy, Dry Needling, Spinal manipulation, Spinal mobilization, Cryotherapy, Moist heat, Manual therapy, and Re-evaluation.   PLAN FOR NEXT SESSION: Review HEP and progress PRN, manual/stretching for lumbar mobility, progress core and hip strengthening as tolerated   Hilda Blades, PT, DPT, LAT, ATC 03/08/22  4:58 PM Phone:  [phone] Fax: [phone]

## 2022-03-08 ENCOUNTER — Ambulatory Visit: Payer: BC Managed Care – PPO | Admitting: Physical Therapy

## 2022-03-08 ENCOUNTER — Encounter: Payer: Self-pay | Admitting: Physical Therapy

## 2022-03-08 ENCOUNTER — Other Ambulatory Visit: Payer: Self-pay

## 2022-03-08 DIAGNOSIS — M5459 Other low back pain: Secondary | ICD-10-CM | POA: Diagnosis not present

## 2022-03-08 DIAGNOSIS — M6281 Muscle weakness (generalized): Secondary | ICD-10-CM

## 2022-03-13 NOTE — Therapy (Signed)
OUTPATIENT PHYSICAL THERAPY TREATMENT NOTE   Patient Name: Anthony Vincent MRN: [id number] DOB:12-21-1970, 52 y.o., male Today's Date: 03/15/2022  PCP: [name], MD REFERRING PROVIDER: [name], MD   END OF SESSION:   PT End of Session - 03/15/22 1615     Visit Number 4    Number of Visits 9    Date for PT Re-Evaluation 04/11/22    Authorization Type BCBS    PT Start Time 1615    PT Stop Time 1700    PT Time Calculation (min) 45 min    Activity Tolerance Patient tolerated treatment well    Behavior During Therapy WFL for tasks assessed/performed               Past Medical History:  Diagnosis Date   Allergy    seasonal allergies   Diabetes mellitus without complication ([place])    on meds   Diabetic peripheral neuropathy ([place])    on meds   Hyperlipidemia    on meds   Hypertension    on meds   Neuromuscular disorder 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   Tinea corporis 09/10/2013   Essential hypertension, benign 10/01/2012   Diabetes ([place]) 10/01/2012   Dyslipidemia 10/01/2012    REFERRING DIAG: Chronic midline low back pain without sciatica   THERAPY DIAG:  Other low back pain  Muscle weakness (generalized)  Rationale for Evaluation and Treatment Rehabilitation  PERTINENT HISTORY: DM with neuropathy, HTN   PRECAUTIONS: None    SUBJECTIVE:             SUBJECTIVE STATEMENT:  Patient reports he is doing good, he did have a fall since last visit where he was trying to step over a gate while carrying a pitcher of water, denies any injuries other than skinning up his  knee. Reports the back is still feeling about the same, it is not disabling but can notice something is going on when he is getting out bed. He does feel like getting out of chair is easier.   PAIN:  Are you having pain? Yes:  NPRS scale: 0/10 (up to 6/10 at worst) Pain location: Low back Pain description: "strong" pain, "not sharp" Aggravating factors: Getting up after lying down or sitting position Relieving factors: Nothing, pain will subside with walking   OBJECTIVE: (objective measures completed at initial evaluation unless otherwise dated) PATIENT SURVEYS:  FOTO 57% functional status   MUSCLE LENGTH: Bilateral hamstring deficit   POSTURE:             Slightly reduced lumbar lordosis   PALPATION: Patient with mild tenderness to bilateral lumbar paraspinals and hypomobility with PAIVMs of lumbar region 03/08/2022: PAIVMs of lumbar grossly WFL and non-painful, no lumbar paraspinal tenderness noted   LUMBAR ROM:    AROM eval  Flexion 75%  Extension 75%  Right lateral flexion 75%  Left lateral flexion 75%  Right rotation 75%  Left rotation 75%                        Patient reports low back twinge with extension  LOWER  EXTREMITY MMT:     MMT Right eval Left eval Rt / Lt 03/01/2022  Hip flexion 5 5   Hip extension 4 4   Hip abduction '4 4 4 '$ / 4  Knee flexion 5 5   Knee extension 5 5    (Blank rows = not tested)  FUNCTIONAL TESTS:  Not assessed     TODAY'S TREATMENT:         [facility] Adult PT Treatment:                                                DATE: 03/15/2022 Therapeutic Exercise: Recumbent bike L3 x 5 min while taking subjective Seated hamstring stretch 3 x 20 each LTR in figure-4 position x 5 each Piriformis stretch push/pull 2 x 20 sec each 90-90 abdominal set 2 x 10 with 10 sec hold Figure-4 bridge 2 x 10 each Seated physioball rollout x 5 forward   [facility] Adult PT Treatment:                                                DATE: 03/08/2022 Therapeutic  Exercise: NuStep L7 x 5 min with UE/LE while taking subjective Seated hamstring stretch 2 x 20 each  LTR x 5 each Piriformis stretch push/pull 2 x 20 sec each Prone quad stretch 2 x 20 sec Prone hip extension 2 x 10 each Prone lumbar extension 2 x 10 Bridge with ball squeeze 2 x 10 90-90 abdominal set with ball squeeze 2 x 10 Sidelying hip abduction 2 x 15 each Sit to stand 2 x 10  [facility] Adult PT Treatment:                                                DATE: 03/01/2022 Therapeutic Exercise: NuStep L7 x 5 min with UE/LE while taking subjective Seated hamstring stretch 2 x 20 each LTR x 10 Piriformis stretch 2 x 20 sec each Supine pelvic tilt x 10 Supine pelvic tilt with march x 10 Supine bridge x 10 - spinal articulation Sidelying hip abduction 2 x 10 each Sidelying thoracolumbar rotation x 5 each Cat cow x 10 Child's pose 2 x 20 sec Prone press up x 10 Sit to stand 2 x 10   PATIENT EDUCATION:  Education details: HEP update Person educated: Patient Education method: Explanation, Demonstration, Tactile cues, Verbal cues, handout Education comprehension: verbalized understanding, returned demonstration, verbal cues required, tactile cues required, and needs further education   HOME EXERCISE PROGRAM: Access Code: [email or web address]      ASSESSMENT: CLINICAL IMPRESSION: Patient tolerated therapy well with no adverse effects. Therapy focused primarily on progressing lumbar mobility, hip flexibility, and core and hip strengthening. He reports improvement in his symptoms but continues to have pain primarily in the morning. He did not report any pain with therapy but did not feeling stretching of the lower back with stretching. He requires cueing for proper exercise technique and core activation. Updated his HEP to progress mobility and strengthening. Patient would benefit from continued skilled PT to progress his mobility and strength in order to reduce pain  and maximize functional ability.      OBJECTIVE IMPAIRMENTS: decreased activity tolerance, decreased ROM, decreased strength, impaired flexibility, postural dysfunction, and pain.    ACTIVITY LIMITATIONS: lifting, sitting, transfers, and bed mobility   PARTICIPATION LIMITATIONS: driving and occupation   PERSONAL FACTORS: Fitness, Past/current experiences, and Time since onset of injury/illness/exacerbation are also affecting patient's functional outcome.      GOALS: Goals reviewed with patient? Yes   SHORT TERM GOALS: Target date: 03/13/2022   Patient will be I with initial HEP in order to progress with therapy. Baseline: HEP provided at eval 03/15/2022: progressing Goal status: ONGOING   2.  PT will review FOTO with patient by 3rd visit in order to understand expected progress and outcome with therapy. Baseline: FOTO assessed at eval 03/15/2022: reviewed Goal status: MET   3.  Patient will report low back pain when getting up in the morning </= 5/10 in order to improve mobility and reduce functional limitations Baseline: 7-8/10 pain 03/15/2022: 6/10 Goal status: ONGOING   LONG TERM GOALS: Target date: 04/10/2022   Patient will be I with final HEP to maintain progress from PT. Baseline: HEP provided at eval Goal status: INITIAL   2.  Patient will report >/= 66% status on FOTO to indicate improved functional ability. Baseline: 57% functional status Goal status: INITIAL   3.  Patient will demonstrates lumbar AROM WFL and no pain with movement in order to improve mobility and reduce pain after lying for extended periods. Baseline: patient demonstrates limitations listed above Goal status: INITIAL   4.  Patient will exhibit hip and core strength grossly >/= 4+/5 MMT in order to improve lumbar control and lifting ability Baseline: grossly 4/5 MMT Goal status: INITIAL     PLAN: PT FREQUENCY: 1-2x/week   PT DURATION: 8 weeks   PLANNED INTERVENTIONS: Therapeutic exercises, Therapeutic activity, Neuromuscular  re-education, Balance training, Gait training, Patient/Family education, Self Care, Joint mobilization, Joint manipulation, Aquatic Therapy, Dry Needling, Spinal manipulation, Spinal mobilization, Cryotherapy, Moist heat, Manual therapy, and Re-evaluation.   PLAN FOR NEXT SESSION: Review HEP and progress PRN, manual/stretching for lumbar mobility, progress core and hip strengthening as tolerated   Hilda Blades, PT, DPT, LAT, ATC 03/15/22  5:05 PM Phone: [phone] Fax: [phone]

## 2022-03-14 ENCOUNTER — Other Ambulatory Visit: Payer: Self-pay | Admitting: Family Medicine

## 2022-03-14 DIAGNOSIS — I152 Hypertension secondary to endocrine disorders: Secondary | ICD-10-CM

## 2022-03-14 NOTE — Telephone Encounter (Signed)
Courtesy refill. Future visit in 1 month. Requested Prescriptions  Pending Prescriptions Disp Refills   amLODipine (NORVASC) 10 MG tablet [Pharmacy Med Name: amLODIPine BESYLATE 10 MG TAB] 90 tablet 0    Sig: TAKE 1 TABLET BY MOUTH DAILY     Cardiovascular: Calcium Channel Blockers 2 Failed - 03/14/2022  6:01 AM      Failed - Valid encounter within last 6 months    Recent Outpatient Visits           7 months ago Type 2 diabetes mellitus with other specified complication, with long-term current use of insulin ([place])   [place], [place], MD   1 year ago Type 2 diabetes mellitus with hyperglycemia, with long-term current use of insulin ([place])   [place], [place], [place]   1 year ago Type 2 diabetes mellitus without complication, with long-term current use of insulin [facility])   [facility], [place], [place]   1 year ago Type 2 diabetes mellitus with hyperglycemia, with long-term current use of insulin ([place])   [place], Charlane Ferretti, MD   2 years ago Type 2 diabetes mellitus with hyperglycemia, with long-term current use of insulin [facility])   [place], [place], MD       Future Appointments             In 1 month [place], [place]ne [place], [place] BP in normal range    BP Readings from Last 1 Encounters:  07/21/21 132/78         Passed - Last Heart Rate in normal range    Pulse Readings from Last 1 Encounters:  07/21/21 96

## 2022-03-15 ENCOUNTER — Other Ambulatory Visit: Payer: Self-pay

## 2022-03-15 ENCOUNTER — Ambulatory Visit: Payer: BC Managed Care – PPO | Admitting: Physical Therapy

## 2022-03-15 ENCOUNTER — Encounter: Payer: Self-pay | Admitting: Physical Therapy

## 2022-03-15 DIAGNOSIS — M5459 Other low back pain: Secondary | ICD-10-CM

## 2022-03-15 DIAGNOSIS — M6281 Muscle weakness (generalized): Secondary | ICD-10-CM

## 2022-03-15 NOTE — Patient Instructions (Signed)
Access Code: [email or web address] URL: [email or web address] Date: 03/15/2022 Prepared by: Hilda Blades  Exercises - Supine Pelvic Tilt  - 1-2 x daily - 1 sets - 10 reps - Supine March with Posterior Pelvic Tilt  - 1-2 x daily - 10 reps - Supine Lower Trunk Rotation  - 1-2 x daily - 10 reps - Figure 4 Bridge  - 1 x daily - 2 sets - 10 reps - Sidelying Thoracic Lumbar Rotation  - 1-2 x daily - 10 reps - Sidelying Hip Abduction  - 1 x daily - 2 sets - 10 reps - Seated Pelvic Tilt  - 1-2 x daily - 1 sets - 10 reps - Seated Hamstring Stretch  - 1-2 x daily - 3 reps - 20 seconds hold

## 2022-03-21 NOTE — Therapy (Signed)
OUTPATIENT PHYSICAL THERAPY TREATMENT NOTE   Patient Name: Anthony Vincent MRN: [id number] DOB:09-28-1970, 52 y.o., male Today's Date: 03/22/2022  PCP: [name], MD REFERRING PROVIDER: [name], MD   END OF SESSION:   PT End of Session - 03/22/22 1611     Visit Number 5    Number of Visits 9    Date for PT Re-Evaluation 04/11/22    Authorization Type BCBS    PT Start Time 1615    PT Stop Time 1700    PT Time Calculation (min) 45 min    Activity Tolerance Patient tolerated treatment well    Behavior During Therapy WFL for tasks assessed/performed                Past Medical History:  Diagnosis Date   Allergy    seasonal allergies   Diabetes mellitus without complication ([place])    on meds   Diabetic peripheral neuropathy ([place])    on meds   Hyperlipidemia    on meds   Hypertension    on meds   Neuromuscular disorder [facility])    Past Surgical History:  Procedure Laterality Date   WISDOM TOOTH EXTRACTION     Patient Active Problem List   Diagnosis Date Noted   Right shoulder pain 09/01/2020   Hyperlipidemia 10/01/2017   Type 2 diabetes mellitus without complication, with long-term current use of insulin ([place]) 03/27/2017   Colon cancer screening 12/10/2016   Erectile dysfunction 08/15/2016   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 States the back pain does not stop him from moving or anything, but he does have to  move slow.  PAIN:  Are you having pain? Yes:  NPRS scale: 0/10 (up to 6/10 at worst) Pain location: Low back Pain description: "strong" pain, "not sharp" Aggravating factors: Getting up after lying down or sitting position Relieving factors: Nothing, pain will subside with walking   OBJECTIVE: (objective measures completed at initial evaluation unless otherwise dated) PATIENT SURVEYS:  FOTO 57% functional status   MUSCLE LENGTH: Bilateral hamstring deficit   POSTURE:             Slightly reduced lumbar lordosis   PALPATION: Patient with mild tenderness to bilateral lumbar paraspinals and hypomobility with PAIVMs of lumbar region 03/08/2022: PAIVMs of lumbar grossly WFL and non-painful, no lumbar paraspinal tenderness noted   LUMBAR ROM:    AROM eval  Flexion 75%  Extension 75%  Right lateral flexion 75%  Left lateral flexion 75%  Right rotation 75%  Left rotation 75%                        Patient reports low back twinge with extension  LOWER EXTREMITY MMT:     MMT Right eval Left eval Rt / Lt 03/01/2022 Rt / Lt 03/22/2022  Hip flexion 5 5    Hip extension '4 4  4 '$ /  4  Hip abduction '4 4 4 '$ / 4 4 / 4  Knee flexion 5 5    Knee extension 5 5     (Blank rows = not tested)  FUNCTIONAL TESTS:  Not assessed     TODAY'S TREATMENT:         [facility] Adult PT Treatment:                                                DATE: 03/22/2022 Therapeutic Exercise: Recumbent bike L3 x 5 min while taking subjective Seated hamstring stretch 3 x 20 each Modified thomas stretch 2 x 30 sec each LTR in figure-4 position x 5 each Windshield wipers x 5 Piriformis stretch push/pull 2 x 20 sec each Figure-4 bridge 2 x 10 each Sidelying hip abduction 2 x 15 each Bird dog 2 x 10 Child's pose 3 x 20 sec Dead lift 45# from 8" box 2 x 10   [facility] Adult PT Treatment:                                                DATE: 03/15/2022 Therapeutic Exercise: Recumbent bike L3 x 5 min while taking  subjective Seated hamstring stretch 3 x 20 each LTR in figure-4 position x 5 each Piriformis stretch push/pull 2 x 20 sec each 90-90 abdominal set 2 x 10 with 10 sec hold Figure-4 bridge 2 x 10 each Seated physioball rollout x 5 forward  [facility] Adult PT Treatment:                                                DATE: 03/08/2022 Therapeutic Exercise: NuStep L7 x 5 min with UE/LE while taking subjective Seated hamstring stretch 2 x 20 each Seated physioball rollout x 5 fwd, x 2 lateral  LTR x 5 each Piriformis stretch push/pull 2 x 20 sec each Prone quad stretch 2 x 20 sec Prone hip extension 2 x 10 each Prone lumbar extension 2 x 10 Bridge with ball squeeze 2 x 10 90-90 abdominal set with ball squeeze 2 x 10 Sidelying hip abduction 2 x 15 each Sit to stand 2 x 10   PATIENT EDUCATION:  Education details: HEP Person educated: Patient Education method: Consulting civil engineer, Demonstration, Corporate treasurer cues, Verbal cues Education comprehension: verbalized understanding, returned demonstration, verbal cues required, tactile cues required, and needs further education   HOME EXERCISE PROGRAM: Access Code: [email or web address]      ASSESSMENT: CLINICAL IMPRESSION: Patient tolerated therapy well with no adverse effects. Therapy continues to focus on progressing lumbar and hip flexibility and progressing hip and core strength with good tolerance. He reported feeling looser post session and with no pain reported in session. He does continue to exhibit gross hip strength deficit this visit. Incorporated lifting this visit with good tolerance and did require cueing for proper technique with lifting and maintaining neutral lumbar spine without excessive flexion. No changes to HEP this visit. Patient would benefit from continued skilled PT to progress his mobility and strength in order to reduce pain and maximize functional ability.     OBJECTIVE IMPAIRMENTS: decreased  activity tolerance, decreased ROM, decreased strength,  impaired flexibility, postural dysfunction, and pain.    ACTIVITY LIMITATIONS: lifting, sitting, transfers, and bed mobility   PARTICIPATION LIMITATIONS: driving and occupation   PERSONAL FACTORS: Fitness, Past/current experiences, and Time since onset of injury/illness/exacerbation are also affecting patient's functional outcome.      GOALS: Goals reviewed with patient? Yes   SHORT TERM GOALS: Target date: 03/13/2022   Patient will be I with initial HEP in order to progress with therapy. Baseline: HEP provided at eval 03/15/2022: progressing Goal status: ONGOING   2.  PT will review FOTO with patient by 3rd visit in order to understand expected progress and outcome with therapy. Baseline: FOTO assessed at eval 03/15/2022: reviewed Goal status: MET   3.  Patient will report low back pain when getting up in the morning </= 5/10 in order to improve mobility and reduce functional limitations Baseline: 7-8/10 pain 03/15/2022: 6/10 Goal status: ONGOING   LONG TERM GOALS: Target date: 04/10/2022   Patient will be I with final HEP to maintain progress from PT. Baseline: HEP provided at eval Goal status: INITIAL   2.  Patient will report >/= 66% status on FOTO to indicate improved functional ability. Baseline: 57% functional status Goal status: INITIAL   3.  Patient will demonstrates lumbar AROM WFL and no pain with movement in order to improve mobility and reduce pain after lying for extended periods. Baseline: patient demonstrates limitations listed above Goal status: INITIAL   4.  Patient will exhibit hip and core strength grossly >/= 4+/5 MMT in order to improve lumbar control and lifting ability Baseline: grossly 4/5 MMT Goal status: INITIAL     PLAN: PT FREQUENCY: 1-2x/week   PT DURATION: 8 weeks   PLANNED INTERVENTIONS: Therapeutic exercises, Therapeutic activity, Neuromuscular re-education, Balance training, Gait training, Patient/Family education, Self Care, Joint  mobilization, Joint manipulation, Aquatic Therapy, Dry Needling, Spinal manipulation, Spinal mobilization, Cryotherapy, Moist heat, Manual therapy, and Re-evaluation.   PLAN FOR NEXT SESSION: Review HEP and progress PRN, manual/stretching for lumbar mobility, progress core and hip strengthening as tolerated   Hilda Blades, PT, DPT, LAT, ATC 03/22/22  5:00 PM Phone: [phone] Fax: [phone]

## 2022-03-22 ENCOUNTER — Ambulatory Visit: Payer: BC Managed Care – PPO | Attending: Family Medicine | Admitting: Physical Therapy

## 2022-03-22 ENCOUNTER — Other Ambulatory Visit: Payer: Self-pay

## 2022-03-22 ENCOUNTER — Encounter: Payer: Self-pay | Admitting: Physical Therapy

## 2022-03-22 DIAGNOSIS — M5459 Other low back pain: Secondary | ICD-10-CM | POA: Diagnosis present

## 2022-03-22 DIAGNOSIS — M6281 Muscle weakness (generalized): Secondary | ICD-10-CM | POA: Insufficient documentation

## 2022-03-28 ENCOUNTER — Other Ambulatory Visit: Payer: Self-pay | Admitting: Family Medicine

## 2022-03-28 DIAGNOSIS — E1169 Type 2 diabetes mellitus with other specified complication: Secondary | ICD-10-CM

## 2022-04-05 ENCOUNTER — Other Ambulatory Visit: Payer: Self-pay

## 2022-04-05 ENCOUNTER — Ambulatory Visit: Payer: BC Managed Care – PPO | Admitting: Physical Therapy

## 2022-04-05 ENCOUNTER — Encounter: Payer: Self-pay | Admitting: Physical Therapy

## 2022-04-05 DIAGNOSIS — M5459 Other low back pain: Secondary | ICD-10-CM | POA: Diagnosis not present

## 2022-04-05 DIAGNOSIS — M6281 Muscle weakness (generalized): Secondary | ICD-10-CM

## 2022-04-05 NOTE — Therapy (Signed)
OUTPATIENT PHYSICAL THERAPY TREATMENT NOTE   Patient Name: Anthony Vincent MRN: [id number] DOB:[birth year], 52 y.o., male Today's Date: 04/05/2022  PCP: [name], MD REFERRING PROVIDER: [name], MD   END OF SESSION:   PT End of Session - 04/05/22 1616     Visit Number 6    Number of Visits 9    Date for PT Re-Evaluation 04/11/22    Authorization Type BCBS    PT Start Time 1530    PT Stop Time 1615    PT Time Calculation (min) 45 min    Activity Tolerance Patient tolerated treatment well    Behavior During Therapy WFL for tasks assessed/performed                 Past Medical History:  Diagnosis Date   Allergy    seasonal allergies   Diabetes mellitus without complication ([place])    on meds   Diabetic peripheral neuropathy ([place])    on meds   Hyperlipidemia    on meds   Hypertension    on meds   Neuromuscular disorder [facility])    Past Surgical History:  Procedure Laterality Date   WISDOM TOOTH EXTRACTION     Patient Active Problem List   Diagnosis Date Noted   Right shoulder pain 09/01/2020   Hyperlipidemia 10/01/2017   Type 2 diabetes mellitus without complication, with long-term current use of insulin ([place]) 03/27/2017   Colon cancer screening 12/10/2016   Erectile dysfunction 08/15/2016   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 It doesn't stop his movement but he can feel it.  PAIN:  Are you having pain? Yes:  NPRS scale: 0/10 (up to 2-3/10 at worst) Pain  location: Low back Pain description: "strong" pain, "not sharp" Aggravating factors: Getting up after lying down or sitting position Relieving factors: Nothing, pain will subside with walking   OBJECTIVE: (objective measures completed at initial evaluation unless otherwise dated) PATIENT SURVEYS:  FOTO 57% functional status  04/05/2022: 67%   MUSCLE LENGTH: Bilateral hamstring deficit   POSTURE:             Slightly reduced lumbar lordosis   PALPATION: Patient with mild tenderness to bilateral lumbar paraspinals and hypomobility with PAIVMs of lumbar region 03/08/2022: PAIVMs of lumbar grossly WFL and non-painful, no lumbar paraspinal tenderness noted   LUMBAR ROM:    AROM eval  Flexion 75%  Extension 75%  Right lateral flexion 75%  Left lateral flexion 75%  Right rotation 75%  Left rotation 75%                        Patient reports low back twinge with extension  LOWER EXTREMITY MMT:     MMT Right eval Left eval Rt / Lt 03/01/2022 Rt / Lt 03/22/2022  Hip flexion 5 5    Hip extension 4 4  4 $ / 4  Hip abduction 4 4 4 $ / 4 4 / 4  Knee flexion  5 5    Knee extension 5 5     (Blank rows = not tested)  FUNCTIONAL TESTS:  Not assessed     TODAY'S TREATMENT:         [facility] Adult PT Treatment:                                                DATE: 04/05/2022 Therapeutic Exercise: LTR x 10 SKTC 2 x 20 sec Windshield wipers x 5 Childs pose x 20 sec fwd, x 20 sec lat Prone quad stretch2 x 20 sec Prone alternating hip extension x 20 Prone alternating swimmer x 20 Piriformis stretch push/pull 2 x 15 sec each Bridge 2 x 10 each Kneeling hip hinge against purple power band 2 x 10 Manual: Skilled palpation and monitoring of of muscle tension while performing TPDN STM bilateral lumbar paraspinals Trigger Point Dry Needling Treatment: Pre-treatment instruction: Patient instructed on dry needling rationale, procedures, and possible side effects including pain during treatment  (achy,cramping feeling), bruising, drop of blood, lightheadedness, nausea, sweating. Patient Consent Given: Yes Education handout provided: No Muscles treated: bilateral lumbar multifidi Needle size and number: .30x54m x 4 Electrical stimulation performed: No Parameters: N/A Treatment response/outcome: Palpable decrease in muscle tension Post-treatment instructions: Patient instructed to expect possible mild to moderate muscle soreness later today and/or tomorrow. Patient instructed in methods to reduce muscle soreness and to continue prescribed HEP. If patient was dry needled over the lung field, patient was instructed on signs and symptoms of pneumothorax and, however unlikely, to see immediate medical attention should they occur. Patient was also educated on signs and symptoms of infection and to seek medical attention should they occur. Patient verbalized understanding of these instructions and education.   [facility]                                                DATE: 03/22/2022 Therapeutic Exercise: Recumbent bike L3 x 5 min while taking subjective Seated hamstring stretch 3 x 20 each Modified thomas stretch 2 x 30 sec each LTR in figure-4 position x 5 each Windshield wipers x 5 Piriformis stretch push/pull 2 x 20 sec each Figure-4 bridge 2 x 10 each Sidelying hip abduction 2 x 15 each Bird dog 2 x 10 Child's pose 3 x 20 sec Dead lift 45# from 8" box 2 x 10  [facility] Adult PT Treatment:                                                DATE: 03/15/2022 Therapeutic Exercise: Recumbent bike L3 x 5 min while taking subjective Seated hamstring stretch 3 x 20 each LTR in figure-4 position x 5 each Piriformis stretch push/pull 2 x 20 sec each 90-90 abdominal set 2 x 10 with 10 sec hold Figure-4 bridge 2 x 10 each Seated physioball rollout x 5 forward   PATIENT EDUCATION:  Education details: HEP Person educated: Patient Education method: EConsulting civil engineer Demonstration, TCorporate treasurer cues, Verbal cues Education comprehension: verbalized understanding, returned demonstration, verbal cues required, tactile cues required, and needs further education  HOME EXERCISE PROGRAM: Access Code: [id number]      ASSESSMENT: CLINICAL IMPRESSION: Patient tolerated therapy well with no adverse effects. Performed TPDN this visit for the lumbar multifidi with good tolerance. He did report some post treatment soreness as expected. Therapy continues to progress his flexibility and strength. He does report increased tightness and difficulty on the left with exercises. No increased pain noted in therapy. He does report an improvement in his functional ability on FOTO this visit, achieving his LTG. No changes to HEP this visit. Patient would benefit from continued skilled PT to progress his mobility and strength in order to reduce pain and maximize functional ability.     OBJECTIVE IMPAIRMENTS: decreased activity tolerance, decreased ROM, decreased strength, impaired flexibility, postural dysfunction, and pain.    ACTIVITY LIMITATIONS: lifting, sitting, transfers, and bed mobility   PARTICIPATION LIMITATIONS: driving and occupation   PERSONAL FACTORS: Fitness, Past/current experiences, and Time since onset of injury/illness/exacerbation are also affecting patient's functional outcome.      GOALS: Goals reviewed with patient? Yes   SHORT TERM GOALS: Target date: 03/13/2022   Patient will be I with initial HEP in order to progress with therapy. Baseline: HEP provided at eval 03/15/2022: progressing 04/05/2022: independent Goal status: MET   2.  PT will review FOTO with patient by 3rd visit in order to understand expected progress and outcome with therapy. Baseline: FOTO assessed at eval 03/15/2022: reviewed Goal status: MET   3.  Patient will report low back pain when getting up in the morning </= 5/10 in order to improve mobility and reduce functional limitations Baseline: 7-8/10  pain 03/15/2022: 6/10 04/05/2022: 2-3/10 Goal status: MET   LONG TERM GOALS: Target date: 04/10/2022   Patient will be I with final HEP to maintain progress from PT. Baseline: HEP provided at eval Goal status: INITIAL   2.  Patient will report >/= 66% status on FOTO to indicate improved functional ability. Baseline: 57% functional status 04/05/2022: 67% Goal status: MET   3.  Patient will demonstrates lumbar AROM WFL and no pain with movement in order to improve mobility and reduce pain after lying for extended periods. Baseline: patient demonstrates limitations listed above Goal status: INITIAL   4.  Patient will exhibit hip and core strength grossly >/= 4+/5 MMT in order to improve lumbar control and lifting ability Baseline: grossly 4/5 MMT Goal status: INITIAL     PLAN: PT FREQUENCY: 1-2x/week   PT DURATION: 8 weeks   PLANNED INTERVENTIONS: Therapeutic exercises, Therapeutic activity, Neuromuscular re-education, Balance training, Gait training, Patient/Family education, Self Care, Joint mobilization, Joint manipulation, Aquatic Therapy, Dry Needling, Spinal manipulation, Spinal mobilization, Cryotherapy, Moist heat, Manual therapy, and Re-evaluation.   PLAN FOR NEXT SESSION: Review HEP and progress PRN, manual/stretching for lumbar mobility, progress core and hip strengthening as tolerated   Hilda Blades, PT, DPT, LAT, ATC 04/05/22  4:34 PM Phone: [phone] Fax: [phone]

## 2022-04-14 ENCOUNTER — Other Ambulatory Visit: Payer: Self-pay | Admitting: Family Medicine

## 2022-04-14 DIAGNOSIS — E1169 Type 2 diabetes mellitus with other specified complication: Secondary | ICD-10-CM

## 2022-04-16 NOTE — Telephone Encounter (Signed)
Requested medication (s) are due for refill today:   Yes  Requested medication (s) are on the active medication list:   Yes  Future visit scheduled:   Yes with Freeman Caldron 04/18/2022   Last ordered: 07/21/2021  #90, 1 refill  Returned because A1C is due and needs an appt. But has one in 2 days with Levada Dy.  Provider to review for refills prior to appt.   Requested Prescriptions  Pending Prescriptions Disp Refills   JARDIANCE 25 MG TABS tablet [Pharmacy Med Name: JARDIANCE 25 MG TABLET] 90 tablet 1    Sig: TAKE 1 TABLET BY MOUTH DAILY     Endocrinology:  Diabetes - SGLT2 Inhibitors Failed - 04/14/2022  6:30 AM      Failed - HBA1C is between 0 and 7.9 and within 180 days    HbA1c, POC (controlled diabetic range)  Date Value Ref Range Status  07/21/2021 6.9 0.0 - 7.0 % Final         Failed - Valid encounter within last 6 months    Recent Outpatient Visits           8 months ago Type 2 diabetes mellitus with other specified complication, with long-term current use of insulin ([place])   [place], Charlane Ferretti, MD   1 year ago Type 2 diabetes mellitus with hyperglycemia, with long-term current use of insulin ([place])   [place], Charlane Ferretti, MD   1 year ago Type 2 diabetes mellitus without complication, with long-term current use of insulin ([place])   [facility], [place], [place]   2 years ago Type 2 diabetes mellitus with hyperglycemia, with long-term current use of insulin ([place])   [place], Charlane Ferretti, MD   2 years ago Type 2 diabetes mellitus with hyperglycemia, with long-term current use of insulin ([place])   [place], Charlane Ferretti, MD       Future Appointments             In 2 days [place], [place] in normal  range and within 360 days    Creat  Date Value Ref Range Status  12/22/2015 1.12 0.60 - 1.35 mg/dL Final   Creatinine, Ser  Date Value Ref Range Status  08/03/2021 1.04 0.76 - 1.27 mg/dL Final   Creatinine, Urine  Date Value Ref Range Status  12/22/2015 215 20 - 370 mg/dL Final         Passed - eGFR in normal range and within 360 days    GFR, Est African American  Date Value Ref Range Status  12/22/2015 >89 >=60 mL/min Final   GFR calc Af Amer  Date Value Ref Range Status  03/22/2020 81 >59 mL/min/1.73 Final    Comment:    **In accordance with recommendations from the NKF-ASN Task force,**   [facility] is in the process of updating its eGFR calculation to the   2021 CKD-EPI creatinine equation that estimates kidney function   without a race variable.    GFR, Est Non African American  Date Value Ref Range Status  12/22/2015 78 >=60 mL/min Final   GFR calc non Af Amer  Date Value Ref Range Status  03/22/2020 70 >59 mL/min/1.73 Final   eGFR  Date  Value Ref Range Status  08/03/2021 86 >59 mL/min/1.73 Final

## 2022-04-16 NOTE — Therapy (Signed)
OUTPATIENT PHYSICAL THERAPY TREATMENT NOTE  DISCHARGE   Patient Name: Anthony Vincent MRN: [id number] DOB:[birth year], 52 y.o., male Today's Date: 04/17/2022  PCP: [name], MD REFERRING PROVIDER: [name], MD   END OF SESSION:   PT End of Session - 04/17/22 1610     Visit Number 7    Number of Visits 9    Date for PT Re-Evaluation 04/17/22    Authorization Type BCBS    PT Start Time 1605    PT Stop Time [id number]    PT Time Calculation (min) 45 min    Activity Tolerance Patient tolerated treatment well    Behavior During Therapy WFL for tasks assessed/performed                  Past Medical History:  Diagnosis Date   Allergy    seasonal allergies   Diabetes mellitus without complication ([place])    on meds   Diabetic peripheral neuropathy ([place])    on meds   Hyperlipidemia    on meds   Hypertension    on meds   Neuromuscular disorder [facility])    Past Surgical History:  Procedure Laterality Date   WISDOM TOOTH EXTRACTION     Patient Active Problem List   Diagnosis Date Noted   Right shoulder pain 09/01/2020   Hyperlipidemia 10/01/2017   Type 2 diabetes mellitus without complication, with long-term current use of insulin ([place]) 03/27/2017   Colon cancer screening 12/10/2016   Erectile dysfunction 08/15/2016   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 He did notice some improvement following last visit.   PAIN:  Are you having pain? Yes:  NPRS scale: 0/10 (up to 2-3/10 at  worst) Pain location: Low back Pain description: "strong" pain, "not sharp" Aggravating factors: Getting up after lying down or sitting position Relieving factors: Nothing, pain will subside with walking   OBJECTIVE: (objective measures completed at initial evaluation unless otherwise dated) PATIENT SURVEYS:  FOTO 57% functional status  04/05/2022: 67%   MUSCLE LENGTH: Bilateral hamstring deficit   POSTURE:             Slightly reduced lumbar lordosis   PALPATION: Patient with mild tenderness to bilateral lumbar paraspinals and hypomobility with PAIVMs of lumbar region 03/08/2022: PAIVMs of lumbar grossly WFL and non-painful, no lumbar paraspinal tenderness noted   LUMBAR ROM:    AROM eval 04/17/22  Flexion 75% WFL  Extension 75% 75%  Right lateral flexion 75% WFL  Left lateral flexion 75% WFL  Right rotation 75% 75%  Left rotation 75% 75%                        Patient reports low back twinge with extension  LOWER EXTREMITY MMT:     MMT Right eval Left eval Rt / Lt 03/01/2022 Rt / Lt 03/22/2022 Rt / Lt 04/17/22  Hip flexion 5 5     Hip extension '4 4  4 '$ / 4 4 / 4  Hip abduction '4 4 4 '$ / 4 4 / 4 4+ / 4+  Knee flexion 5 5     Knee extension 5 5      (Blank rows = not tested)  FUNCTIONAL TESTS:  Not assessed     TODAY'S TREATMENT:         [facility] Adult PT Treatment:                                                DATE: 04/17/2022 Therapeutic Exercise: NuStep L7 x 5 min with UE/LE while taking subjective LTR x 10 Windshield wipers x 5 Sidelying hip abduction x 15 each Bridge x 10 each Childs pose 2 x 20 sec fwd, x 20 sec lat each Bird dog x 10 Kneeling hip hinge against purple power band x 10 Dead lift 45# 2 x 10 Manual: Skilled palpation and monitoring of of muscle tension while performing TPDN STM bilateral lumbar paraspinals Trigger Point Dry Needling Treatment: Pre-treatment instruction: Patient instructed on dry needling rationale, procedures, and possible side  effects including pain during treatment (achy,cramping feeling), bruising, drop of blood, lightheadedness, nausea, sweating. Patient Consent Given: Yes Education handout provided: No Muscles treated: bilateral lumbar multifidi Needle size and number: Marland Kitchen.[id number] x 4 Electrical stimulation performed: Yes Parameters: milli, 4-10 bps, intensity to patient tolerance and adjusted as needed during treatment for strong muscle contraction Treatment response/outcome: Palpable decrease in muscle tension Post-treatment instructions: Patient instructed to expect possible mild to moderate muscle soreness later today and/or tomorrow. Patient instructed in methods to reduce muscle soreness and to continue prescribed HEP. If patient was dry needled over the lung field, patient was instructed on signs and symptoms of pneumothorax and, however unlikely, to see immediate medical attention should they occur. Patient was also educated on signs and symptoms of infection and to seek medical attention should they occur. Patient verbalized understanding of these instructions and education.   [facility]                                                [facility]: 04[facility]2022 Therapeutic Exercise: LTR x 10 SKTC 2 x 20 sec Windshield wipers x 5 Childs pose x 20 sec fwd, x 20 sec lat Prone quad stretch2 x 20 sec Prone alternating hip extension x 20 Prone alternating swimmer x 20 Piriformis stretch push/pull 2 x 15 sec each Bridge 2 x 10 each Kneeling hip hinge against purple power band 2 x 10 Manual: Skilled palpation and monitoring of of muscle tension while performing TPDN STM bilateral lumbar paraspinals Trigger Point Dry Needling Treatment: Pre-treatment instruction: Patient instructed on dry needling rationale, procedures, and possible side effects including pain during treatment (achy,cramping feeling), bruising, drop of blood, lightheadedness, nausea, sweating. Patient Consent Given: Yes Education handout  provided: No Muscles treated: bilateral lumbar multifidi Needle size and number: .[id number] Electrical stimulation performed: No Parameters: N/A Treatment response/outcome: Palpable decrease in muscle tension Post-treatment instructions: Patient instructed to expect possible mild to moderate muscle soreness later today and/or tomorrow. Patient instructed in methods to reduce muscle soreness and to continue prescribed HEP. If patient was dry needled over the lung field, patient was instructed on signs and symptoms of pneumothorax and, however unlikely,  to see immediate medical attention should they occur. Patient was also educated on signs and symptoms of infection and to seek medical attention should they occur. Patient verbalized understanding of these instructions and education.  [facility] Adult PT Treatment:                                                DATE: 03/22/2022 Therapeutic Exercise: Recumbent bike L3 x 5 min while taking subjective Seated hamstring stretch 3 x 20 each Modified thomas stretch 2 x 30 sec each LTR in figure-4 position x 5 each Windshield wipers x 5 Piriformis stretch push/pull 2 x 20 sec each Figure-4 bridge 2 x 10 each Sidelying hip abduction 2 x 15 each Bird dog 2 x 10 Child's pose 3 x 20 sec Dead lift 45# from 8" box 2 x 10   PATIENT EDUCATION:  Education details: HEP Person educated: Patient Education method: Consulting civil engineer, Demonstration, Tactile cues, Verbal cues Education comprehension: verbalized understanding, returned demonstration, verbal cues required, tactile cues required, and needs further education   HOME EXERCISE PROGRAM: Access Code: [id number]      ASSESSMENT: CLINICAL IMPRESSION: Patient tolerated therapy well with no adverse effects. He did report improvement following the TPDN last visit so performed again this visit with e-stim further added benefit to reduce muscle tension. He is reporting an improvement in his functional ability and  demonstrates improved lumbar motion and strength this visit. He does continue to exhibit limitations with lumbar motion and hip extension strength, but overall has improved. He elects to work on his HEP independently at this time and will follow-up with his referring provider as neede, so patient will be formally discharged from PT.     OBJECTIVE IMPAIRMENTS: decreased activity tolerance, decreased ROM, decreased strength, impaired flexibility, postural dysfunction, and pain.    ACTIVITY LIMITATIONS: lifting, sitting, transfers, and bed mobility   PARTICIPATION LIMITATIONS: driving and occupation   PERSONAL FACTORS: Fitness, Past/current experiences, and Time since onset of injury/illness/exacerbation are also affecting patient's functional outcome.      GOALS: Goals reviewed with patient? Yes   SHORT TERM GOALS: Target date: 03/13/2022   Patient will be I with initial HEP in order to progress with therapy. Baseline: HEP provided at eval 03/15/2022: progressing 04/05/2022: independent Goal status: MET   2.  PT will review FOTO with patient by 3rd visit in order to understand expected progress and outcome with therapy. Baseline: FOTO assessed at eval 03/15/2022: reviewed Goal status: MET   3.  Patient will report low back pain when getting up in the morning </= 5/10 in order to improve mobility and reduce functional limitations Baseline: 7-8/10 pain 03/15/2022: 6/10 04/05/2022: 2-3/10 Goal status: MET   LONG TERM GOALS: Target date: 04/10/2022   Patient will be I with final HEP to maintain progress from PT. Baseline: HEP provided at eval 04/17/2022: progressing Goal status: IN PROGRESS   2.  Patient will report >/= 66% status on FOTO to indicate improved functional ability. Baseline: 57% functional status 04/05/2022: 67% Goal status: MET   3.  Patient will demonstrates lumbar AROM WFL and no pain with movement in order to improve mobility and reduce pain after lying for extended  periods. Baseline: patient demonstrates limitations listed above 04/17/2022: see limitations noted above Goal status: PARTIALLY MET   4.  Patient will exhibit hip and core strength grossly >/=  4+/5 MMT in order to improve lumbar control and lifting ability Baseline: grossly 4/5 MMT 04/17/2022: hip extension grossly 4/5 MMT Goal status: PARTIALLY MET     PLAN: PT FREQUENCY: 1-2x/week   PT DURATION: 8 weeks   PLANNED INTERVENTIONS: Therapeutic exercises, Therapeutic activity, Neuromuscular re-education, Balance training, Gait training, Patient/Family education, Self Care, Joint mobilization, Joint manipulation, Aquatic Therapy, Dry Needling, Spinal manipulation, Spinal mobilization, Cryotherapy, Moist heat, Manual therapy, and Re-evaluation.   PLAN FOR NEXT SESSION: NA - discharge   Hilda Blades, PT, DPT, LAT, ATC 04/17/22  4:54 PM Phone: [phone] Fax: [phone]   PHYSICAL THERAPY DISCHARGE SUMMARY  Visits from Start of Care: 7  Current functional level related to goals / functional outcomes: See above   Remaining deficits: See above   Education / Equipment: HEP   Patient agrees to discharge. Patient goals were partially met. Patient is being discharged due to being pleased with the current functional level.

## 2022-04-17 ENCOUNTER — Other Ambulatory Visit: Payer: Self-pay

## 2022-04-17 ENCOUNTER — Ambulatory Visit: Payer: BC Managed Care – PPO | Admitting: Physical Therapy

## 2022-04-17 ENCOUNTER — Encounter: Payer: Self-pay | Admitting: Physical Therapy

## 2022-04-17 DIAGNOSIS — M5459 Other low back pain: Secondary | ICD-10-CM

## 2022-04-17 DIAGNOSIS — M6281 Muscle weakness (generalized): Secondary | ICD-10-CM

## 2022-04-18 ENCOUNTER — Encounter: Payer: Self-pay | Admitting: Physician Assistant

## 2022-04-18 ENCOUNTER — Ambulatory Visit: Payer: BC Managed Care – PPO | Admitting: Family Medicine

## 2022-04-18 ENCOUNTER — Ambulatory Visit: Payer: BC Managed Care – PPO | Attending: Family Medicine | Admitting: Physician Assistant

## 2022-04-18 VITALS — BP 144/81 | HR 87 | Ht 72.0 in | Wt 276.8 lb

## 2022-04-18 DIAGNOSIS — E785 Hyperlipidemia, unspecified: Secondary | ICD-10-CM | POA: Diagnosis not present

## 2022-04-18 DIAGNOSIS — E1165 Type 2 diabetes mellitus with hyperglycemia: Secondary | ICD-10-CM | POA: Diagnosis not present

## 2022-04-18 DIAGNOSIS — I152 Hypertension secondary to endocrine disorders: Secondary | ICD-10-CM

## 2022-04-18 DIAGNOSIS — Z794 Long term (current) use of insulin: Secondary | ICD-10-CM

## 2022-04-18 DIAGNOSIS — E1159 Type 2 diabetes mellitus with other circulatory complications: Secondary | ICD-10-CM | POA: Diagnosis not present

## 2022-04-18 DIAGNOSIS — N529 Male erectile dysfunction, unspecified: Secondary | ICD-10-CM

## 2022-04-18 DIAGNOSIS — E1169 Type 2 diabetes mellitus with other specified complication: Secondary | ICD-10-CM | POA: Diagnosis not present

## 2022-04-18 LAB — POCT GLYCOSYLATED HEMOGLOBIN (HGB A1C): HbA1c, POC (controlled diabetic range): 6.8 % (ref 0.0–7.0)

## 2022-04-18 LAB — GLUCOSE, POCT (MANUAL RESULT ENTRY)
POC Glucose: 67 mg/dl — AB (ref 70–99)
POC Glucose: 81 mg/dl (ref 70–99)

## 2022-04-18 MED ORDER — LISINOPRIL-HYDROCHLOROTHIAZIDE 20-12.5 MG PO TABS: 1.0000 | ORAL_TABLET | Freq: Every day | ORAL | 1 refills | Status: DC

## 2022-04-18 MED ORDER — ATORVASTATIN CALCIUM 40 MG PO TABS: 40.0000 mg | ORAL_TABLET | Freq: Every day | ORAL | 1 refills | Status: DC

## 2022-04-18 MED ORDER — BLOOD PRESSURE CUFF MISC: 1.0000 | Freq: Every day | 0 refills | Status: AC | PRN

## 2022-04-18 MED ORDER — GLIPIZIDE ER 10 MG PO TB24: 20.0000 mg | ORAL_TABLET | Freq: Every day | ORAL | 3 refills | Status: DC

## 2022-04-18 MED ORDER — BD PEN NEEDLE NANO 2ND GEN 32G X 4 MM MISC: 1 refills | Status: AC

## 2022-04-18 MED ORDER — SILDENAFIL CITRATE 50 MG PO TABS: 50.0000 mg | ORAL_TABLET | Freq: Every day | ORAL | 1 refills | Status: DC | PRN

## 2022-04-18 MED ORDER — EMPAGLIFLOZIN 25 MG PO TABS: 25.0000 mg | ORAL_TABLET | Freq: Every day | ORAL | 3 refills | Status: DC

## 2022-04-18 MED ORDER — OZEMPIC (2 MG/DOSE) 8 MG/3ML ~~LOC~~ SOPN: PEN_INJECTOR | SUBCUTANEOUS | 3 refills | Status: DC

## 2022-04-18 MED ORDER — BASAGLAR KWIKPEN 100 UNIT/ML ~~LOC~~ SOPN: 20.0000 [IU] | PEN_INJECTOR | Freq: Every day | SUBCUTANEOUS | 6 refills | Status: DC

## 2022-04-18 MED ORDER — METFORMIN HCL 1000 MG PO TABS: ORAL_TABLET | ORAL | 1 refills | Status: DC

## 2022-04-18 MED ORDER — GABAPENTIN 300 MG PO CAPS: ORAL_CAPSULE | ORAL | 6 refills | Status: DC

## 2022-04-18 MED ORDER — AMLODIPINE BESYLATE 10 MG PO TABS: 10.0000 mg | ORAL_TABLET | Freq: Every day | ORAL | 0 refills | Status: DC

## 2022-04-18 NOTE — Addendum Note (Signed)
Addended by: Leamon Arnt I on: 04/18/2022 04:09 PM   Modules accepted: Orders

## 2022-04-18 NOTE — Progress Notes (Signed)
Patient ID: Anthony Vincent, male   DOB: Oct 23, 1970, 52 y.o.   MRN: [id number]   Anthony Vincent, is a 52 y.o. male  [id number]  [id number]  DOB - September 10, 1970  Chief Complaint  Patient presents with   Diabetes   Medication Refill       Subjective:   Anthony Vincent is a 52 y.o. male here today for med RF.  Not checking blood sugars regularly but when he does it is usu <130.  No hypoglycemic episodes.  Blood sugar 67 today bc he has not eaten all day but he feels fine/no hypoglycemic symptoms.    He never went for sleep study bc he was unsure how much copay would be.    Does not check blood pressure out of the office bc does not have a cuff  Finished PT and his back pain is much better  No problems updated.  ALLERGIES: No Known Allergies  PAST MEDICAL HISTORY: Past Medical History:  Diagnosis Date   Allergy    seasonal allergies   Diabetes mellitus without complication ([place])    on meds   Diabetic peripheral neuropathy ([place])    on meds   Hyperlipidemia    on meds   Hypertension    on meds   Neuromuscular disorder ([place])     MEDICATIONS AT HOME: Prior to Admission medications   Medication Sig Start Date End Date Taking? Authorizing Provider  Blood Pressure Monitoring (BLOOD PRESSURE CUFF) MISC 1 each by Does not apply route daily as needed. 04/18/22  Yes Argentina Donovan, PA-C  diclofenac (VOLTAREN) 75 MG EC tablet Take 1 tablet (75 mg total) by mouth 2 (two) times daily as needed. 07/21/21  Yes [name], MD  methocarbamol (ROBAXIN) 500 MG tablet Take 2 tablets (1,000 mg total) by mouth every 8 (eight) hours as needed for muscle spasms. 09/01/20  Yes Feliberto Gottron, FNP  amLODipine (NORVASC) 10 MG tablet Take 1 tablet (10 mg total) by mouth daily. 04/18/22   Argentina Donovan, PA-C  atorvastatin (LIPITOR) 40 MG tablet Take 1 tablet (40 mg total) by mouth daily. 04/18/22   Argentina Donovan, PA-C  empagliflozin (JARDIANCE) 25 MG TABS tablet Take 1 tablet (25 mg  total) by mouth daily. 04/18/22   Argentina Donovan, PA-C  gabapentin (NEURONTIN) 300 MG capsule TAKE TWO CAPSULES BY MOUTH EVERY MORNING AND TAKE THREE CAPSULES BY MOUTH EVERY EVENING 04/18/22   Freeman Caldron M, PA-C  glipiZIDE (GLUCOTROL XL) 10 MG 24 hr tablet Take 2 tablets (20 mg total) by mouth daily with breakfast. 04/18/22   Argentina Donovan, PA-C  Insulin Glargine (BASAGLAR KWIKPEN) 100 UNIT/ML Inject 20 Units into the skin at bedtime. 04/18/22   Argentina Donovan, PA-C  Insulin Pen Needle (BD PEN NEEDLE NANO 2ND GEN) 32G X 4 MM MISC USE DAILY WITH BASAGLAR 04/18/22   Freeman Caldron M, PA-C  lisinopril-hydrochlorothiazide (ZESTORETIC) 20-12.5 MG tablet Take 1 tablet by mouth daily. 04/18/22   Argentina Donovan, PA-C  metFORMIN (GLUCOPHAGE) 1000 MG tablet TAKE ONE TABLET BY MOUTH TWICE A DAY WITH MEALS 04/18/22   Derenda Giddings, Dionne Bucy, PA-C  Semaglutide, 2 MG/DOSE, (OZEMPIC, 2 MG/DOSE,) 8 MG/3ML SOPN DIAL AND INJECT UNDER THE SKIN 2 MG WEEKLY AS DIRECTED 04/18/22   Argentina Donovan, PA-C  sildenafil (VIAGRA) 50 MG tablet Take 1 tablet (50 mg total) by mouth daily as needed for erectile dysfunction. 04/18/22   Argentina Donovan, PA-C  mupirocin nasal ointment (BACTROBAN) 2 % Place 1  application into the nose 2 (two) times daily. Use one-half of tube in each nostril twice daily for five (5) days. After application, press sides of nose together and gently massage. Patient not taking: Reported on 10/08/2019 09/11/18 11/10/19  Argentina Donovan, PA-C    ROS: Neg HEENT Neg resp Neg cardiac Neg GI Neg GU Neg psych Neg neuro  Objective:   Vitals:   04/18/22 1534  BP: (!) 144/81  Pulse: 87  SpO2: 98%  Weight: 276 lb 12.8 oz (125.6 kg)  Height: 6' (1.829 m)   Exam General appearance : Awake, alert, not in any distress. Speech Clear. Not toxic looking HEENT: Atraumatic and Normocephalic Neck: Supple, no JVD. No cervical lymphadenopathy.  Chest: Good air entry bilaterally, CTAB.  No  rales/rhonchi/wheezing CVS: S1 S2 regular, no murmurs.  Extremities: B/L Lower Ext shows no edema, both legs are warm to touch Neurology: Awake alert, and oriented X 3, CN II-XII intact, Non focal Skin: No Rash  Data Review Lab Results  Component Value Date   HGBA1C 6.8 04/18/2022   HGBA1C 6.9 07/21/2021   HGBA1C 8.8 (A) 09/01/2020    Assessment & Plan   1. Controlled type 2 diabetes mellitus with hyperglycemia, unspecified whether long term insulin use ([place]) Improved-continue to work on diabetic diet - Glucose (CBG) - HgB A1c - Comprehensive metabolic panel - CBC with Differential/Platelet - Lipid panel  2. Hyperlipidemia associated with type 2 diabetes mellitus ([facility]) - Comprehensive metabolic panel - Lipid panel - atorvastatin (LIPITOR) 40 MG tablet; Take 1 tablet (40 mg total) by mouth daily.  Dispense: 90 tablet; Refill: 1  3. Hypertension associated with diabetes ([place]) Check BP OOO and record and bring to next visit - Comprehensive metabolic panel - amLODipine (NORVASC) 10 MG tablet; Take 1 tablet (10 mg total) by mouth daily.  Dispense: 90 tablet; Refill: 0 - lisinopril-hydrochlorothiazide (ZESTORETIC) 20-12.5 MG tablet; Take 1 tablet by mouth daily.  Dispense: 90 tablet; Refill: 1 - Blood Pressure Monitoring (BLOOD PRESSURE CUFF) MISC; 1 each by Does not apply route daily as needed.  Dispense: 1 each; Refill: 0  4. Type 2 diabetes mellitus with other specified complication, with long-term current use of insulin ([place]) See #5  5. Type 2 diabetes mellitus with hyperglycemia, with long-term current use of insulin ([facility]) Improved-work on diabetic diet - empagliflozin (JARDIANCE) 25 MG TABS tablet; Take 1 tablet (25 mg total) by mouth daily.  Dispense: 30 tablet; Refill: 3 - Semaglutide, 2 MG/DOSE, (OZEMPIC, 2 MG/DOSE,) 8 MG/3ML SOPN; DIAL AND INJECT UNDER THE SKIN 2 MG WEEKLY AS DIRECTED  Dispense: 3 mL; Refill: 3 - glipiZIDE (GLUCOTROL XL) 10 MG 24 hr tablet; Take 2  tablets (20 mg total) by mouth daily with breakfast.  Dispense: 60 tablet; Refill: 3 - Insulin Pen Needle (BD PEN NEEDLE NANO 2ND GEN) 32G X 4 MM MISC; USE DAILY WITH BASAGLAR  Dispense: 100 each; Refill: 1 - metFORMIN (GLUCOPHAGE) 1000 MG tablet; TAKE ONE TABLET BY MOUTH TWICE A DAY WITH MEALS  Dispense: 180 tablet; Refill: 1 - Insulin Glargine (BASAGLAR KWIKPEN) 100 UNIT/ML; Inject 20 Units into the skin at bedtime.  Dispense: 30 mL; Refill: 6 - gabapentin (NEURONTIN) 300 MG capsule; TAKE TWO CAPSULES BY MOUTH EVERY MORNING AND TAKE THREE CAPSULES BY MOUTH EVERY EVENING  Dispense: 150 capsule; Refill: 6  6. Erectile dysfunction, unspecified erectile dysfunction type - sildenafil (VIAGRA) 50 MG tablet; Take 1 tablet (50 mg total) by mouth daily as needed for erectile dysfunction.  Dispense:  20 tablet; Refill: 1    Return in about 3 months (around 07/17/2022) for Dr Margarita Rana for chronic conditions.  The patient was given clear instructions to go to ER or return to medical center if symptoms don't improve, worsen or new problems develop. The patient verbalized understanding. The patient was told to call to get lab results if they haven't heard anything in the next week.      Freeman Caldron, PA-C [facility] and [facility] [place], [place]   04/18/2022, 3:53 PM

## 2022-04-19 ENCOUNTER — Other Ambulatory Visit: Payer: Self-pay | Admitting: Family Medicine

## 2022-04-19 DIAGNOSIS — E1165 Type 2 diabetes mellitus with hyperglycemia: Secondary | ICD-10-CM

## 2022-04-19 DIAGNOSIS — E1159 Type 2 diabetes mellitus with other circulatory complications: Secondary | ICD-10-CM

## 2022-04-19 LAB — LIPID PANEL
Chol/HDL Ratio: 3.2 ratio (ref 0.0–5.0)
Cholesterol, Total: 82 mg/dL — ABNORMAL LOW (ref 100–199)
HDL: 26 mg/dL — ABNORMAL LOW (ref >39)
LDL Chol Calc (NIH): 41 mg/dL (ref 0–99)
Triglycerides: 64 mg/dL (ref 0–149)
VLDL Cholesterol Cal: 15 mg/dL (ref 5–40)

## 2022-04-19 LAB — COMPREHENSIVE METABOLIC PANEL
ALT: 30 IU/L (ref 0–44)
AST: 20 IU/L (ref 0–40)
Albumin/Globulin Ratio: 1.7 (ref 1.2–2.2)
Albumin: 4.7 g/dL (ref 3.8–4.9)
Alkaline Phosphatase: 93 IU/L (ref 44–121)
BUN/Creatinine Ratio: 7 — ABNORMAL LOW (ref 9–20)
BUN: 7 mg/dL (ref 6–24)
Bilirubin Total: 1.3 mg/dL — ABNORMAL HIGH (ref 0.0–1.2)
CO2: 24 mmol/L (ref 20–29)
Calcium: 9.6 mg/dL (ref 8.7–10.2)
Chloride: 102 mmol/L (ref 96–106)
Creatinine, Ser: 1.07 mg/dL (ref 0.76–1.27)
Globulin, Total: 2.7 g/dL (ref 1.5–4.5)
Glucose: 71 mg/dL (ref 70–99)
Potassium: 4.3 mmol/L (ref 3.5–5.2)
Sodium: 142 mmol/L (ref 134–144)
Total Protein: 7.4 g/dL (ref 6.0–8.5)
eGFR: 83 mL/min/{1.73_m2} (ref >59)

## 2022-04-19 LAB — CBC WITH DIFFERENTIAL/PLATELET
Basophils Absolute: 0.1 10*3/uL (ref 0.0–0.2)
Basos: 1 %
EOS (ABSOLUTE): 0.2 10*3/uL (ref 0.0–0.4)
Eos: 2 %
Hematocrit: 46 % (ref 37.5–51.0)
Hemoglobin: 15.5 g/dL (ref 13.0–17.7)
Immature Grans (Abs): 0 10*3/uL (ref 0.0–0.1)
Immature Granulocytes: 0 %
Lymphocytes Absolute: 2.4 10*3/uL (ref 0.7–3.1)
Lymphs: 31 %
MCH: 26.9 pg (ref 26.6–33.0)
MCHC: 33.7 g/dL (ref 31.5–35.7)
MCV: 80 fL (ref 79–97)
Monocytes Absolute: 0.5 10*3/uL (ref 0.1–0.9)
Monocytes: 7 %
Neutrophils Absolute: 4.7 10*3/uL (ref 1.4–7.0)
Neutrophils: 59 %
Platelets: 246 10*3/uL (ref 150–450)
RBC: 5.77 x10E6/uL (ref 4.14–5.80)
RDW: 14.8 % (ref 11.6–15.4)
WBC: 7.8 10*3/uL (ref 3.4–10.8)

## 2022-04-19 NOTE — Telephone Encounter (Signed)
Unable to refill per protocol, Rx request is too soon. Last refill 04/18/22.  Requested Prescriptions  Pending Prescriptions Disp Refills   glipiZIDE (GLUCOTROL XL) 10 MG 24 hr tablet [Pharmacy Med Name: glipiZIDE XL 10 MG TABLET] 60 tablet 3    Sig: TAKE 2 TABLETS BY MOUTH EVERY MORNING WITH BREAKFAST     Endocrinology:  Diabetes - Sulfonylureas Passed - 04/19/2022  6:01 AM      Passed - HBA1C is between 0 and 7.9 and within 180 days    HbA1c, POC (controlled diabetic range)  Date Value Ref Range Status  04/18/2022 6.8 0.0 - 7.0 % Final         Passed - Cr in normal range and within 360 days    Creat  Date Value Ref Range Status  12/22/2015 1.12 0.60 - 1.35 mg/dL Final   Creatinine, Ser  Date Value Ref Range Status  04/18/2022 1.07 0.76 - 1.27 mg/dL Final   Creatinine, Urine  Date Value Ref Range Status  12/22/2015 215 20 - 370 mg/dL Final         Passed - Valid encounter within last 6 months    Recent Outpatient Visits           Yesterday Controlled type 2 diabetes mellitus with hyperglycemia, unspecified whether long term insulin use ([place])   [place], [place], [place]   9 months ago Type 2 diabetes mellitus with other specified complication, with long-term current use of insulin ([place])   [place], Charlane Ferretti, MD   1 year ago Type 2 diabetes mellitus with hyperglycemia, with long-term current use of insulin ([place])   [place], Charlane Ferretti, MD   1 year ago Type 2 diabetes mellitus without complication, with long-term current use of insulin ([place])   [place] [facility], [place], [place]   2 years ago Type 2 diabetes mellitus with hyperglycemia, with long-term current use of insulin ([place])   [place], Charlane Ferretti, MD       Future Appointments             In 3 months [name],  MD [place]             lisinopril-hydrochlorothiazide (ZESTORETIC) 20-12.5 MG tablet [Pharmacy Med Name: LISINOPRIL-HCTZ 20-12.5 MG TAB] 30 tablet     Sig: TAKE 1 TABLET BY MOUTH DAILY     Cardiovascular:  ACEI + Diuretic Combos Failed - 04/19/2022  6:01 AM      Failed - Last BP in normal range    BP Readings from Last 1 Encounters:  04/18/22 (!) 144/81         Passed - Na in normal range and within 180 days    Sodium  Date Value Ref Range Status  04/18/2022 142 134 - 144 mmol/L Final         Passed - K in normal range and within 180 days    Potassium  Date Value Ref Range Status  04/18/2022 4.3 3.5 - 5.2 mmol/L Final         Passed - Cr in normal range and within 180 days    Creat  Date Value Ref Range Status  12/22/2015 1.12 0.60 - 1.35 mg/dL Final   Creatinine, Ser  Date Value Ref Range Status  04/18/2022 1.07 0.76 - 1.27  mg/dL Final   Creatinine, Urine  Date Value Ref Range Status  12/22/2015 215 20 - 370 mg/dL Final         Passed - eGFR is 30 or above and within 180 days    GFR, Est African American  Date Value Ref Range Status  12/22/2015 >89 >=60 mL/min Final   GFR calc Af Amer  Date Value Ref Range Status  03/22/2020 81 >59 mL/min/1.73 Final    Comment:    **In accordance with recommendations from the NKF-ASN Task force,**   [facility] is in the process of updating its eGFR calculation to the   2021 CKD-EPI creatinine equation that estimates kidney function   without a race variable.    GFR, Est Non African American  Date Value Ref Range Status  12/22/2015 78 >=60 mL/min Final   GFR calc non Af Amer  Date Value Ref Range Status  03/22/2020 70 >59 mL/min/1.73 Final   eGFR  Date Value Ref Range Status  04/18/2022 83 >59 mL/min/1.73 Final         Passed - Patient is not pregnant      Passed - Valid encounter within last 6 months    Recent Outpatient Visits           Yesterday Controlled type 2  diabetes mellitus with hyperglycemia, unspecified whether long term insulin use [facility])   [place], [place], [place]   9 months ago Type 2 diabetes mellitus with other specified complication, with long-term current use of insulin ([place])   [place], [place], MD   1 year ago Type 2 diabetes mellitus with hyperglycemia, with long-term current use of insulin ([place])   [place], Charlane Ferretti, MD   1 year ago Type 2 diabetes mellitus without complication, with long-term current use of insulin [facility])   [place] [facility], [place], [place]   2 years ago Type 2 diabetes mellitus with hyperglycemia, with long-term current use of insulin [facility])   [place], [place], MD       Future Appointments             In 3 months [name], MD [place]

## 2022-07-17 ENCOUNTER — Telehealth: Payer: Self-pay

## 2022-07-17 ENCOUNTER — Other Ambulatory Visit: Payer: Self-pay

## 2022-07-17 NOTE — Progress Notes (Unsigned)
Patient attempted to be outreached by Verneice Caspers Messa, PharmD Candidate on 07/17/22 to discuss hypertension. Left voicemail for patient to return our call at their convenience at [phone].  Rinda Rollyson, Student-PharmD 

## 2022-07-17 NOTE — Progress Notes (Signed)
   Anthony Vincent 10/06/1970 [id number]  He had a script for a BP cuff sent to [facility] but it was never filled.   Patient outreached by Seward Meth , PharmD Candidate on 07/17/22.  Blood Pressure Readings: Last documented ambulatory systolic blood pressure: 144 Last documented ambulatory diastolic blood pressure: 81 Does the patient have a validated home blood pressure machine?: No They report home readings n/a  Medication review was performed. Is the patient taking their medications as prescribed?: Yes Differences from their prescribed list include: n/a  The following barriers to adherence were noted: Does the patient have cost concerns?: No (Pt states his insurance no longer covers Basaglar and is concerned about getting insulin in the future. As of now he still has 10 pens so he states it is not urgent.)  Does the patient have transportation concerns?: No Does the patient need assistance obtaining refills?: No Does the patient occassionally forget to take some of their prescribed medications?: No Does the patient feel like one/some of their medications make them feel poorly?: Yes (Ozempic causes him to use the bathroom frequently) Does the patient have questions or concerns about their medications?: No Does the patient have a follow up scheduled with their primary care provider/cardiologist?: Yes   Interventions: Interventions Completed: Medications were reviewed  The patient has follow up scheduled:  PCP: Hoy Register, MD   Seward Meth, Student-PharmD

## 2022-07-18 ENCOUNTER — Encounter: Payer: Self-pay | Admitting: Family Medicine

## 2022-07-18 ENCOUNTER — Ambulatory Visit: Payer: BC Managed Care – PPO | Attending: Family Medicine | Admitting: Family Medicine

## 2022-07-18 VITALS — BP 120/77 | HR 97 | Ht 72.0 in | Wt 269.0 lb

## 2022-07-18 DIAGNOSIS — L0100 Impetigo, unspecified: Secondary | ICD-10-CM

## 2022-07-18 DIAGNOSIS — E1169 Type 2 diabetes mellitus with other specified complication: Secondary | ICD-10-CM

## 2022-07-18 DIAGNOSIS — B353 Tinea pedis: Secondary | ICD-10-CM

## 2022-07-18 DIAGNOSIS — Z7985 Long-term (current) use of injectable non-insulin antidiabetic drugs: Secondary | ICD-10-CM

## 2022-07-18 DIAGNOSIS — Z23 Encounter for immunization: Secondary | ICD-10-CM

## 2022-07-18 DIAGNOSIS — G8929 Other chronic pain: Secondary | ICD-10-CM

## 2022-07-18 DIAGNOSIS — R197 Diarrhea, unspecified: Secondary | ICD-10-CM

## 2022-07-18 DIAGNOSIS — M545 Low back pain, unspecified: Secondary | ICD-10-CM | POA: Diagnosis not present

## 2022-07-18 DIAGNOSIS — Z794 Long term (current) use of insulin: Secondary | ICD-10-CM

## 2022-07-18 DIAGNOSIS — Z7984 Long term (current) use of oral hypoglycemic drugs: Secondary | ICD-10-CM

## 2022-07-18 LAB — POCT GLYCOSYLATED HEMOGLOBIN (HGB A1C): HbA1c, POC (controlled diabetic range): 6.1 % (ref 0.0–7.0)

## 2022-07-18 MED ORDER — TERBINAFINE HCL 1 % EX CREA: 1.0000 | TOPICAL_CREAM | Freq: Two times a day (BID) | CUTANEOUS | 1 refills | Status: AC

## 2022-07-18 MED ORDER — TIZANIDINE HCL 4 MG PO TABS: 4.0000 mg | ORAL_TABLET | Freq: Three times a day (TID) | ORAL | 1 refills | Status: DC | PRN

## 2022-07-18 MED ORDER — METFORMIN HCL 500 MG PO TABS: 500.0000 mg | ORAL_TABLET | Freq: Two times a day (BID) | ORAL | 1 refills | Status: DC

## 2022-07-18 MED ORDER — CEPHALEXIN 500 MG PO CAPS: 500.0000 mg | ORAL_CAPSULE | Freq: Two times a day (BID) | ORAL | 0 refills | Status: DC

## 2022-07-18 NOTE — Progress Notes (Signed)
Subjective:  Patient ID: Anthony Vincent, male    DOB: 05/18/1970  Age: 52 y.o. MRN: [id number]  CC: Diabetes   HPI Anthony Vincent is a 52 y.o. year old male with a history of type 2 diabetes mellitus (A1c 6.1), hypertension, hyperlipidemia who presents today for follow-up visit.   Interval History:  He has a lesion on his scalp noticed 5 days ago which is crusting which he states reoccurs in his nasolabial fold and sometimes on his scalp especially when the seasons change.  Lesions sometimes have a yellow drainage.  A1c is 6.1 down from 6.8. He has some numbness and tingling in his feet and is on Gabapentin Ozempic causes him to have some diarrhea but he still likes to take it because he has had good results with regards to his weight and glycemic control.  He has no visual concerns.  His back continues to hurt even after PT.  He remains on gabapentin and diclofenac.  In the past he could not tolerate Cymbalta due to vomiting. Past Medical History:  Diagnosis Date   Allergy    seasonal allergies   Diabetes mellitus without complication (HCC)    on meds   Diabetic peripheral neuropathy (HCC)    on meds   Hyperlipidemia    on meds   Hypertension    on meds   Neuromuscular disorder [facility])     Past Surgical History:  Procedure Laterality Date   WISDOM TOOTH EXTRACTION      Family History  Problem Relation Age of Onset   Diabetes Mother    Diabetes Father    Colon cancer Neg Hx    Colon polyps Neg Hx    Esophageal cancer Neg Hx    Stomach cancer Neg Hx    Rectal cancer Neg Hx     Social History   Socioeconomic History   Marital status: Single    Spouse name: Not on file   Number of children: Not on file   Years of education: Not on file   Highest education level: Not on file  Occupational History   Not on file  Tobacco Use   Smoking status: Never   Smokeless tobacco: Never  Vaping Use   Vaping Use: Never used  Substance and Sexual Activity   Alcohol use:  No   Drug use: No   Sexual activity: Not on file  Other Topics Concern   Not on file  Social History Narrative   Not on file   Social Determinants of Health   Financial Resource Strain: Not on file  Food Insecurity: Not on file  Transportation Needs: Not on file  Physical Activity: Not on file  Stress: Not on file  Social Connections: Not on file    No Known Allergies  Outpatient Medications Prior to Visit  Medication Sig Dispense Refill   amLODipine (NORVASC) 10 MG tablet Take 1 tablet (10 mg total) by mouth daily. 90 tablet 0   atorvastatin (LIPITOR) 40 MG tablet Take 1 tablet (40 mg total) by mouth daily. 90 tablet 1   Blood Pressure Monitoring (BLOOD PRESSURE CUFF) MISC 1 each by Does not apply route daily as needed. 1 each 0   empagliflozin (JARDIANCE) 25 MG TABS tablet Take 1 tablet (25 mg total) by mouth daily. 30 tablet 3   gabapentin (NEURONTIN) 300 MG capsule TAKE TWO CAPSULES BY MOUTH EVERY MORNING AND TAKE THREE CAPSULES BY MOUTH EVERY EVENING 150 capsule 6   glipiZIDE (GLUCOTROL XL) 10 MG 24  hr tablet Take 2 tablets (20 mg total) by mouth daily with breakfast. 60 tablet 3   Insulin Glargine (BASAGLAR KWIKPEN) 100 UNIT/ML Inject 20 Units into the skin at bedtime. 30 mL 6   Insulin Pen Needle (BD PEN NEEDLE NANO 2ND GEN) 32G X 4 MM MISC USE DAILY WITH BASAGLAR 100 each 1   lisinopril-hydrochlorothiazide (ZESTORETIC) 20-12.5 MG tablet Take 1 tablet by mouth daily. 90 tablet 1   Semaglutide, 2 MG/DOSE, (OZEMPIC, 2 MG/DOSE,) 8 MG/3ML SOPN DIAL AND INJECT UNDER THE SKIN 2 MG WEEKLY AS DIRECTED 3 mL 3   sildenafil (VIAGRA) 50 MG tablet Take 1 tablet (50 mg total) by mouth daily as needed for erectile dysfunction. 20 tablet 1   metFORMIN (GLUCOPHAGE) 1000 MG tablet TAKE ONE TABLET BY MOUTH TWICE A DAY WITH MEALS 180 tablet 1   diclofenac (VOLTAREN) 75 MG EC tablet Take 1 tablet (75 mg total) by mouth 2 (two) times daily as needed. 60 tablet 2   Facility-Administered  Medications Prior to Visit  Medication Dose Route Frequency Provider Last Rate Last Admin   0.9 %  sodium chloride infusion  500 mL Intravenous Once Charlie Pitter III, MD         ROS Review of Systems  Constitutional:  Negative for activity change and appetite change.  HENT:  Negative for sinus pressure and sore throat.   Respiratory:  Negative for chest tightness, shortness of breath and wheezing.   Cardiovascular:  Negative for chest pain and palpitations.  Gastrointestinal:  Positive for diarrhea. Negative for abdominal distention, abdominal pain and constipation.  Genitourinary: Negative.   Musculoskeletal:  Positive for back pain.  Skin:  Positive for rash.  Psychiatric/Behavioral:  Negative for behavioral problems and dysphoric mood.     Objective:  BP 120/77   Pulse 97   Ht 6' (1.829 m)   Wt 269 lb (122 kg)   SpO2 97%   BMI 36.48 kg/m      07/18/2022    9:49 AM 04/18/2022    3:34 PM 07/21/2021    4:25 PM  BP/Weight  Systolic BP 120 144 132  Diastolic BP 77 81 78  Wt. (Lbs) 269 276.8 282.4  BMI 36.48 kg/m2 37.54 kg/m2 38.3 kg/m2      Physical Exam Constitutional:      Appearance: He is well-developed.  Cardiovascular:     Rate and Rhythm: Normal rate.     Heart sounds: Normal heart sounds. No murmur heard. Pulmonary:     Effort: Pulmonary effort is normal.     Breath sounds: Normal breath sounds. No wheezing or rales.  Chest:     Chest wall: No tenderness.  Abdominal:     General: Bowel sounds are normal. There is no distension.     Palpations: Abdomen is soft. There is no mass.     Tenderness: There is no abdominal tenderness.  Musculoskeletal:        General: Normal range of motion.     Right lower leg: No edema.     Left lower leg: No edema.  Skin:    Comments: Two tiny scabs on right temporal region, no discharge noted  Neurological:     Mental Status: He is alert and oriented to person, place, and time.  Psychiatric:        Mood and Affect:  Mood normal.    Diabetic Foot Exam - Simple   Simple Foot Form Visual Inspection See comments: Yes Sensation Testing Intact to touch and  monofilament testing bilaterally: Yes Pulse Check Posterior Tibialis and Dorsalis pulse intact bilaterally: Yes Comments Onychomycosis of great toenails bilaterally. Tinea pedis of fourth webspace bilaterally.  No ulcerations or skin breakdown.         Latest Ref Rng & Units 04/18/2022    4:03 PM 08/03/2021    8:51 AM 09/01/2020   10:53 AM  CMP  Glucose 70 - 99 mg/dL 71  161  096   BUN 6 - 24 mg/dL 7  8  11    Creatinine 0.76 - 1.27 mg/dL 0.45  4.09  8.11   Sodium 134 - 144 mmol/L 142  145  140   Potassium 3.5 - 5.2 mmol/L 4.3  4.5  4.4   Chloride 96 - 106 mmol/L 102  105  101   CO2 20 - 29 mmol/L 24  21  19    Calcium 8.7 - 10.2 mg/dL 9.6  9.5  9.7   Total Protein 6.0 - 8.5 g/dL 7.4  7.3  8.0   Total Bilirubin 0.0 - 1.2 mg/dL 1.3  0.6  0.9   Alkaline Phos 44 - 121 IU/L 93  86  94   AST 0 - 40 IU/L 20  11  20    ALT 0 - 44 IU/L 30  22  45     Lipid Panel     Component Value Date/Time   CHOL 82 (L) 04/18/2022 1603   TRIG 64 04/18/2022 1603   HDL 26 (L) 04/18/2022 1603   CHOLHDL 3.2 04/18/2022 1603   CHOLHDL 4.3 12/22/2015 1207   VLDL 11 12/22/2015 1207   LDLCALC 41 04/18/2022 1603    CBC    Component Value Date/Time   WBC 7.8 04/18/2022 1603   WBC 6.6 12/22/2015 1207   RBC 5.77 04/18/2022 1603   RBC 5.88 (H) 12/22/2015 1207   HGB 15.5 04/18/2022 1603   HCT 46.0 04/18/2022 1603   PLT 246 04/18/2022 1603   MCV 80 04/18/2022 1603   MCH 26.9 04/18/2022 1603   MCH 27.2 12/22/2015 1207   MCHC 33.7 04/18/2022 1603   MCHC 34.3 12/22/2015 1207   RDW 14.8 04/18/2022 1603   LYMPHSABS 2.4 04/18/2022 1603   MONOABS 462 12/22/2015 1207   EOSABS 0.2 04/18/2022 1603   BASOSABS 0.1 04/18/2022 1603    Lab Results  Component Value Date   HGBA1C 6.1 07/18/2022    Assessment & Plan:  1. Type 2 diabetes mellitus with other specified  complication, without long-term current use of insulin (HCC) Controlled with A1c of 6.1 He would like to remain on Ozempic despite the presence of diarrhea hence I will decrease metformin dose to reduce GI side effects He has requested a note for work to allow him to use the bathroom frequently Counseled on Diabetic diet, my plate method, 914 minutes of moderate intensity exercise/week Blood sugar logs with fasting goals of 80-120 mg/dl, random of less than 782 and in the event of sugars less than 60 mg/dl or greater than 956 mg/dl encouraged to notify the clinic. Advised on the need for annual eye exams, annual foot exams, Pneumonia vaccine. - POCT glycosylated hemoglobin (Hb A1C) - Microalbumin/Creatinine Ratio, Urine - metFORMIN (GLUCOPHAGE) 500 MG tablet; Take 1 tablet (500 mg total) by mouth 2 (two) times daily with a meal. TAKE ONE TABLET BY MOUTH TWICE A DAY WITH MEALS  Dispense: 180 tablet; Refill: 1  2. Diarrhea, unspecified type Likely medication induced Metformin dose has been decreased to decrease incidence of diarrhea He would  like to remain on Ozempic  3. Chronic midline low back pain without sciatica Uncontrolled Offered to refer to spine specialist for possible ESI but he declines He has completed PT but this has been ineffective - tiZANidine (ZANAFLEX) 4 MG tablet; Take 1 tablet (4 mg total) by mouth every 8 (eight) hours as needed.  Dispense: 60 tablet; Refill: 1  4. Tinea pedis of both feet - terbinafine (LAMISIL AT) 1 % cream; Apply 1 Application topically 2 (two) times daily.  Dispense: 42 g; Refill: 1  5. Impetigo - cephALEXin (KEFLEX) 500 MG capsule; Take 1 capsule (500 mg total) by mouth 2 (two) times daily.  Dispense: 14 capsule; Refill: 0   Meds ordered this encounter  Medications   cephALEXin (KEFLEX) 500 MG capsule    Sig: Take 1 capsule (500 mg total) by mouth 2 (two) times daily.    Dispense:  14 capsule    Refill:  0   terbinafine (LAMISIL AT) 1 %  cream    Sig: Apply 1 Application topically 2 (two) times daily.    Dispense:  42 g    Refill:  1   metFORMIN (GLUCOPHAGE) 500 MG tablet    Sig: Take 1 tablet (500 mg total) by mouth 2 (two) times daily with a meal. TAKE ONE TABLET BY MOUTH TWICE A DAY WITH MEALS    Dispense:  180 tablet    Refill:  1    Dose decrease   tiZANidine (ZANAFLEX) 4 MG tablet    Sig: Take 1 tablet (4 mg total) by mouth every 8 (eight) hours as needed.    Dispense:  60 tablet    Refill:  1    Follow-up: Return in about 6 months (around 01/18/2023) for Chronic medical conditions.       Anthony Register, MD, FAAFP. [facility] [place], [place] [phone]   07/18/2022, 12:45 PM

## 2022-07-18 NOTE — Patient Instructions (Signed)
Impetigo, Adult Impetigo is an infection of the skin. It commonly occurs in young children, but it can also occur in adults. The infection causes itchy blisters and sores that produce brownish-yellow fluid. As the fluid dries, it forms a thick, honey-colored crust. These skin changes usually occur on the face, but they can also affect other areas of the body. Impetigo usually goes away in 7-10 days with treatment. What are the causes? This condition is caused by two types of bacteria. It may be caused by staphylococci or streptococci bacteria. These bacteria cause impetigo when they get under the surface of the skin. This often happens after some damage to the skin, such as: Cuts, scrapes, or scratches. Rashes. Insect bites, especially when you scratch the area of a bite. Chickenpox or other illnesses that cause open skin sores. Nail biting or chewing. Impetigo can spread easily from one person to another (is contagious). It may be spread through close skin contact or by sharing towels, clothing, or other items that an infected person has touched. Scratching the affected area can cause impetigo to spread to other parts of the body. The bacteria can get under your fingernails and spread when you touch another area of your skin. What increases the risk? The following factors may make you more likely to develop this condition: Playing sports that include skin-to-skin contact with others. Having broken skin, such as from a cut or scrape. Living in an area that has high humidity levels. Having poor hygiene. Having high levels of staphylococci in your nose. Having a condition that weakens the skin integrity, such as: Having a weak body defense system (immune system). Having a skin condition with open sores, such as chickenpox. Having diabetes. What are the signs or symptoms? The main symptom of this condition is small blisters, often on the face around the mouth and nose. In time, the blisters break  open and turn into tiny sores (lesions) with a yellow crust. In some cases, the blisters cause itching or burning. Scratching, irritation, or lack of treatment may cause these small lesions to get larger. Other possible symptoms include: Larger blisters. Pus. Swollen lymph glands. How is this diagnosed? This condition is usually diagnosed during a physical exam. A skin sample or a sample of fluid from a blister may be taken for lab tests that involve growing bacteria (culture test). Lab tests can help confirm the diagnosis or help determine the best treatment. How is this treated? Treatment for this condition depends on the severity of the condition: Mild impetigo can be treated with prescription antibiotic cream. Oral antibiotic medicine may be used in more severe cases. Medicines that reduce itchiness (antihistamines)may also be used. Follow these instructions at home: Medicines Take over-the-counter and prescription medicines only as told by your health care provider. Apply or take your antibiotic as told by your health care provider. Do not stop using the antibiotic even if your condition improves. Before applying antibiotic cream or ointment, you should: Gently wash the infected areas with antibacterial soap and warm water. Soak crusted areas in warm, soapy water using antibacterial soap. Gently rub the areas to remove crusts. Do not scrub. Preventing the spread of infection  To help prevent impetigo from spreading to other body areas: Keep your fingernails short and clean. Do not scratch the blisters or sores. Cover infected areas, if necessary, to keep from scratching. Wash your hands often with soap and warm water. To help prevent impetigo from spreading to other people: Do not share towels.  Wash your clothing and bedsheets in water that is 140F (60C) or warmer. Stay home until you have used an antibiotic cream for 48 hours (2 days) or an oral antibiotic medicine for 24 hours  (1 day). You should only return to work and activities with other people if your skin shows significant improvement. You may return to contact sports after you have used antibiotic medicine for 72 hours (3 days). General instructions Keep all follow-up visits. This is important. How is this prevented? Wash your hands often with soap and warm water. Do not share towels, washcloths, clothing, bedding, or razors. Keep your fingernails short. Keep any cuts, scrapes, bug bites, or rashes clean and covered. Use insect repellent to prevent bug bites. Contact a health care provider if: You develop more blisters or sores, even with treatment. Other family members get sores. Your skin sores are not improving after 72 hours (3 days) of treatment. You have a fever. Get help right away if: You see spreading redness or swelling of the skin around your sores. You develop a sore throat. The area around your rash becomes warm, red, or tender to the touch. You have dark, reddish-brown urine. You do not urinate often or you urinate small amounts. You are very tired (lethargic). You have swelling in your face, hands, or feet. Summary Impetigo is a skin infection that causes itchy blisters and sores that produce brownish-yellow fluid. As the fluid dries, it forms a crust. This condition is caused by staphylococci or streptococci bacteria. These bacteria cause impetigo when they get under the surface of the skin, such as through cuts, rashes, bug bites, or open sores. Treatment for this condition may include antibiotic ointment or oral antibiotics. To help prevent impetigo from spreading to other body areas, make sure you keep your fingernails short, avoid scratching, cover any blisters, and wash your hands often. If you have impetigo, stay home until you have used an antibiotic cream for 48 hours (2 days) or an oral antibiotic medicine for 24 hours (1 day). You should only return to work and activities with  other people if your skin shows significant improvement. This information is not intended to replace advice given to you by your health care provider. Make sure you discuss any questions you have with your health care provider. Document Revised: 07/08/2019 Document Reviewed: 07/08/2019 Elsevier Patient Education  2024 [employer].

## 2022-07-18 NOTE — Progress Notes (Signed)
Sore on top of head

## 2022-07-19 LAB — MICROALBUMIN / CREATININE URINE RATIO
Creatinine, Urine: 126.7 mg/dL
Microalb/Creat Ratio: 10 mg/g creat (ref 0–29)
Microalbumin, Urine: 12.8 ug/mL

## 2022-08-15 ENCOUNTER — Other Ambulatory Visit: Payer: Self-pay | Admitting: Physician Assistant

## 2022-08-15 DIAGNOSIS — Z794 Long term (current) use of insulin: Secondary | ICD-10-CM

## 2022-08-17 ENCOUNTER — Other Ambulatory Visit: Payer: Self-pay | Admitting: Physician Assistant

## 2022-08-17 DIAGNOSIS — E1165 Type 2 diabetes mellitus with hyperglycemia: Secondary | ICD-10-CM

## 2022-09-18 ENCOUNTER — Other Ambulatory Visit: Payer: Self-pay | Admitting: Physician Assistant

## 2022-09-18 DIAGNOSIS — E1159 Type 2 diabetes mellitus with other circulatory complications: Secondary | ICD-10-CM

## 2022-09-18 NOTE — Telephone Encounter (Signed)
Requested Prescriptions  Pending Prescriptions Disp Refills   amLODipine (NORVASC) 10 MG tablet [Pharmacy Med Name: AMLODIPINE BESYLATE 10 MG TAB] 90 tablet 0    Sig: TAKE 1 TABLET BY MOUTH DAILY     Cardiovascular: Calcium Channel Blockers 2 Passed - 09/18/2022  6:01 AM      Passed - Last BP in normal range    BP Readings from Last 1 Encounters:  07/18/22 120/77         Passed - Last Heart Rate in normal range    Pulse Readings from Last 1 Encounters:  07/18/22 97         Passed - Valid encounter within last 6 months    Recent Outpatient Visits           2 months ago Type 2 diabetes mellitus with other specified complication, without long-term current use of insulin (HCC)   [place] [facility] [place], [place], [place]   5 months ago Controlled type 2 diabetes mellitus with hyperglycemia, unspecified whether long term insulin use [facility])   [facility] [place], [place], [place]   1 year ago Type 2 diabetes mellitus with other specified complication, with long-term current use of insulin (HCC)   [place] [facility] [place] [facility] [place], [place], MD   1 year ago Type 2 diabetes mellitus with hyperglycemia, with long-term current use of insulin [facility])   [place] [facility], Odette Horns, MD   2 years ago Type 2 diabetes mellitus without complication, with long-term current use of insulin [facility])   [facility], Marzella Schlein, [place]       Future Appointments             In 4 months Hoy Register, MD [facility]

## 2022-11-14 ENCOUNTER — Other Ambulatory Visit: Payer: Self-pay | Admitting: Physician Assistant

## 2022-11-14 DIAGNOSIS — E1165 Type 2 diabetes mellitus with hyperglycemia: Secondary | ICD-10-CM

## 2023-01-11 ENCOUNTER — Other Ambulatory Visit: Payer: Self-pay | Admitting: Physician Assistant

## 2023-01-11 DIAGNOSIS — E1165 Type 2 diabetes mellitus with hyperglycemia: Secondary | ICD-10-CM

## 2023-01-15 ENCOUNTER — Other Ambulatory Visit: Payer: Self-pay | Admitting: Family Medicine

## 2023-01-15 DIAGNOSIS — E1169 Type 2 diabetes mellitus with other specified complication: Secondary | ICD-10-CM

## 2023-01-15 NOTE — Telephone Encounter (Signed)
Requested Prescriptions  Pending Prescriptions Disp Refills   metFORMIN (GLUCOPHAGE) 500 MG tablet [Pharmacy Med Name: METFORMIN HCL 500 MG TABLET] 180 tablet 0    Sig: TAKE 1 TABLET BY MOUTH 2 TIMES A DAY WITH A MEAL     Endocrinology:  Diabetes - Biguanides Failed - 01/15/2023  6:00 AM      Failed - HBA1C is between 0 and 7.9 and within 180 days    HbA1c, POC (controlled diabetic range)  Date Value Ref Range Status  07/18/2022 6.1 0.0 - 7.0 % Final         Failed - B12 Level in normal range and within 720 days    No results found for: "VITAMINB12"       Failed - Valid encounter within last 6 months    Recent Outpatient Visits           6 months ago Type 2 diabetes mellitus with other specified complication, without long-term current use of insulin (HCC)   [facility] [place]. [facility] Hoy Register, MD   9 months ago Controlled type 2 diabetes mellitus with hyperglycemia, unspecified whether long term insulin use (HCC)   [facility] [place]. [facility] [place], [place], [place]   1 year ago Type 2 diabetes mellitus with other specified complication, with long-term current use of insulin (HCC)   [facility] [place]. [facility] Hoy Register, MD   2 years ago Type 2 diabetes mellitus with hyperglycemia, with long-term current use of insulin (HCC)   [facility] [place]. [facility] Hoy Register, MD   2 years ago Type 2 diabetes mellitus without complication, with long-term current use of insulin [facility]   [facility], Marzella Schlein, [place]       Future Appointments             In 1 week Sharon Seller, [place], PA-C [facility] [place]. [facility]            Passed - Cr in normal range and within 360 days    Creat  Date Value  Ref Range Status  12/22/2015 1.12 0.60 - 1.35 mg/dL Final   Creatinine, Ser  Date Value Ref Range Status  04/18/2022 1.07 0.76 - 1.27 mg/dL Final   Creatinine, Urine  Date Value Ref Range Status  12/22/2015 215 20 - 370 mg/dL Final         Passed - eGFR in normal range and within 360 days    GFR, Est African American  Date Value Ref Range Status  12/22/2015 >89 >=60 mL/min Final   GFR calc Af Amer  Date Value Ref Range Status  03/22/2020 81 >59 mL/min/1.73 Final    Comment:    **In accordance with recommendations from the NKF-ASN Task force,**   [facility] is in the process of updating its eGFR calculation to the   2021 CKD-EPI creatinine equation that estimates kidney function   without a race variable.    GFR, Est Non African American  Date Value Ref Range Status  12/22/2015 78 >=60 mL/min Final   GFR calc non Af Amer  Date Value Ref Range Status  03/22/2020 70 >59 mL/min/1.73 Final   eGFR  Date Value Ref Range  Status  04/18/2022 83 >59 mL/min/1.73 Final         Passed - CBC within normal limits and completed in the last 12 months    WBC  Date Value Ref Range Status  04/18/2022 7.8 3.4 - 10.8 x10E3/uL Final  12/22/2015 6.6 3.8 - 10.8 K/uL Final   RBC  Date Value Ref Range Status  04/18/2022 5.77 4.14 - 5.80 x10E6/uL Final  12/22/2015 5.88 (H) 4.20 - 5.80 MIL/uL Final   Hemoglobin  Date Value Ref Range Status  04/18/2022 15.5 13.0 - 17.7 g/dL Final   Hematocrit  Date Value Ref Range Status  04/18/2022 46.0 37.5 - 51.0 % Final   MCHC  Date Value Ref Range Status  04/18/2022 33.7 31.5 - 35.7 g/dL Final  16/11/9602 54.0 32.0 - 36.0 g/dL Final   [facility]  Date Value Ref Range Status  04/18/2022 26.9 26.6 - 33.0 pg Final  12/22/2015 27.2 27.0 - 33.0 pg Final   MCV  Date Value Ref Range Status  04/18/2022 80 79 - 97 fL Final   No results found for: "PLTCOUNTKUC", "LABPLAT", "POCPLA" RDW  Date Value Ref Range Status  04/18/2022 14.8 11.6 - 15.4 % Final

## 2023-01-21 ENCOUNTER — Ambulatory Visit: Payer: BC Managed Care – PPO | Admitting: Family Medicine

## 2023-01-24 ENCOUNTER — Ambulatory Visit (INDEPENDENT_AMBULATORY_CARE_PROVIDER_SITE_OTHER): Payer: BC Managed Care – PPO | Admitting: Podiatry

## 2023-01-24 ENCOUNTER — Encounter: Payer: Self-pay | Admitting: Podiatry

## 2023-01-24 ENCOUNTER — Ambulatory Visit: Payer: BC Managed Care – PPO | Admitting: Physician Assistant

## 2023-01-24 DIAGNOSIS — B351 Tinea unguium: Secondary | ICD-10-CM | POA: Diagnosis not present

## 2023-01-24 DIAGNOSIS — G63 Polyneuropathy in diseases classified elsewhere: Secondary | ICD-10-CM

## 2023-01-24 DIAGNOSIS — L84 Corns and callosities: Secondary | ICD-10-CM | POA: Diagnosis not present

## 2023-01-24 DIAGNOSIS — M79675 Pain in left toe(s): Secondary | ICD-10-CM | POA: Diagnosis not present

## 2023-01-24 DIAGNOSIS — E1142 Type 2 diabetes mellitus with diabetic polyneuropathy: Secondary | ICD-10-CM

## 2023-01-24 DIAGNOSIS — Z794 Long term (current) use of insulin: Secondary | ICD-10-CM

## 2023-01-24 DIAGNOSIS — M79674 Pain in right toe(s): Secondary | ICD-10-CM | POA: Diagnosis not present

## 2023-01-24 DIAGNOSIS — E119 Type 2 diabetes mellitus without complications: Secondary | ICD-10-CM

## 2023-01-27 NOTE — Progress Notes (Signed)
  Subjective:  Patient ID: Anthony Vincent, male    DOB: [birth year],   MRN: [id number]  Chief Complaint  Patient presents with   Foot Pain    Diabetic foot care/nail trim both feet.  RT 2nd toe nail is curving    52 y.o. male presents for diabetic foot care.he also complains of burning, pins and needle sensation in the balls of his feet and his heels.  States he also has painful elongated and thickened toenails. Last A1c was 6.1 . Denies any other pedal complaints. Denies n/v/f/c.   PCP: Hoy Register MD   Past Medical History:  Diagnosis Date   Allergy    seasonal allergies   Diabetes mellitus without complication (HCC)    on meds   Diabetic peripheral neuropathy (HCC)    on meds   Hyperlipidemia    on meds   Hypertension    on meds   Neuromuscular disorder (HCC)     Objective:  Physical Exam: Vascular: DP/PT pulses 2/4 bilateral. CFT <3 seconds. Normal hair growth on digits. No edema.  Skin. No lacerations or abrasions bilateral feet. Nails 1-5 are thickened discolored and elongated with subungual debris suggestive of mycotic infection.  Preulcerative callus present subthird metatarsal head bilaterally. Musculoskeletal: MMT 5/5 bilateral lower extremities in DF, PF, Inversion and Eversion. Deceased ROM in DF of ankle joint.  Neurological: Sensation intact to light touch.  Decreased vibratory sensation.  Protective sensation intact bilaterally.  Assessment:   1. Type 2 diabetes mellitus with diabetic polyneuropathy, with long-term current use of insulin (HCC)   2. Pain due to onychomycosis of toenails of both feet   3. Pre-ulcerative calluses   4. Encounter for diabetic foot exam [facility])      Plan:  Patient was evaluated and treated and all questions answered. -Mycotic toenails x 10 were debrided in thickness and length using aseptic nail nippers without incident.  Mechanically thin down with bur. - Preulcerative callus x 2 was sharply debrided without incident using a  312 scalpel blade.  Discussed use of white vinegar scrubs, offloading techniques with shoes and felt, trying to gently file down the callus with emory board or pumice stone -Discussed importance of tight glycemic control, daily foot checks, avoiding barefoot ambulation in order to limit pedal complications with diabetes including worsening neuropathy and to limit likelihood of developing wound or other pedal complication.  Counseled patient on diabetic footcare education. -Patient states he is currently on gabapentin regarding neuropathy, does feel this controls his symptoms overall. -Answered all patient questions -Patient to return  in 3 months for at risk foot care -Patient advised to call the office if any problems or questions arise in the meantime.  Bronwen Betters, DPM

## 2023-01-31 ENCOUNTER — Ambulatory Visit: Payer: BC Managed Care – PPO | Attending: Family Medicine | Admitting: Family Medicine

## 2023-01-31 ENCOUNTER — Other Ambulatory Visit: Payer: Self-pay | Admitting: Physician Assistant

## 2023-01-31 ENCOUNTER — Encounter: Payer: Self-pay | Admitting: Family Medicine

## 2023-01-31 VITALS — BP 122/76 | HR 101 | Ht 72.0 in | Wt 276.6 lb

## 2023-01-31 DIAGNOSIS — E1159 Type 2 diabetes mellitus with other circulatory complications: Secondary | ICD-10-CM

## 2023-01-31 DIAGNOSIS — M5431 Sciatica, right side: Secondary | ICD-10-CM | POA: Diagnosis not present

## 2023-01-31 DIAGNOSIS — Z23 Encounter for immunization: Secondary | ICD-10-CM | POA: Diagnosis not present

## 2023-01-31 DIAGNOSIS — Z7985 Long-term (current) use of injectable non-insulin antidiabetic drugs: Secondary | ICD-10-CM

## 2023-01-31 DIAGNOSIS — Z7984 Long term (current) use of oral hypoglycemic drugs: Secondary | ICD-10-CM

## 2023-01-31 DIAGNOSIS — I152 Hypertension secondary to endocrine disorders: Secondary | ICD-10-CM

## 2023-01-31 DIAGNOSIS — E1169 Type 2 diabetes mellitus with other specified complication: Secondary | ICD-10-CM

## 2023-01-31 DIAGNOSIS — E785 Hyperlipidemia, unspecified: Secondary | ICD-10-CM

## 2023-01-31 LAB — POCT GLYCOSYLATED HEMOGLOBIN (HGB A1C): HbA1c, POC (controlled diabetic range): 7 % (ref 0.0–7.0)

## 2023-01-31 MED ORDER — EMPAGLIFLOZIN 25 MG PO TABS: 25.0000 mg | ORAL_TABLET | Freq: Every day | ORAL | 1 refills | Status: DC

## 2023-01-31 MED ORDER — TIZANIDINE HCL 4 MG PO TABS: 4.0000 mg | ORAL_TABLET | Freq: Three times a day (TID) | ORAL | 1 refills | Status: DC | PRN

## 2023-01-31 MED ORDER — OZEMPIC (2 MG/DOSE) 8 MG/3ML ~~LOC~~ SOPN: PEN_INJECTOR | SUBCUTANEOUS | 1 refills | Status: DC

## 2023-01-31 MED ORDER — AMLODIPINE BESYLATE 10 MG PO TABS: 10.0000 mg | ORAL_TABLET | Freq: Every day | ORAL | 1 refills | Status: DC

## 2023-01-31 MED ORDER — METFORMIN HCL 500 MG PO TABS: 1000.0000 mg | ORAL_TABLET | Freq: Two times a day (BID) | ORAL | 1 refills | Status: DC

## 2023-01-31 MED ORDER — ATORVASTATIN CALCIUM 40 MG PO TABS: 40.0000 mg | ORAL_TABLET | Freq: Every day | ORAL | 1 refills | Status: DC

## 2023-01-31 MED ORDER — GABAPENTIN 300 MG PO CAPS: ORAL_CAPSULE | ORAL | 6 refills | Status: DC

## 2023-01-31 MED ORDER — LISINOPRIL-HYDROCHLOROTHIAZIDE 20-12.5 MG PO TABS: 1.0000 | ORAL_TABLET | Freq: Every day | ORAL | 1 refills | Status: DC

## 2023-01-31 MED ORDER — GLIPIZIDE ER 10 MG PO TB24: 10.0000 mg | ORAL_TABLET | Freq: Every day | ORAL | 1 refills | Status: DC

## 2023-01-31 NOTE — Patient Instructions (Signed)

## 2023-01-31 NOTE — Progress Notes (Signed)
Subjective:  Patient ID: Anthony Vincent, male    DOB: [birth year]  Age: 52 y.o. MRN: [id number]  CC: Medical Management of Chronic Issues (Sharp pain in lower back/Skin irritation)   HPI Anthony Vincent is a 52 y.o. year old male with a history of type 2 diabetes mellitus (A1c 6.1), hypertension, hyperlipidemia who presents today for follow-up visit.     Interval History: Discussed the use of AI scribe software for clinical note transcription with the patient, who gave verbal consent to proceed.   The patient presents with a recent history of skin irritation and back pain. The skin irritation, located on the chest, was described as a bothersome sensation, akin to the feeling of an emerging bump, that was sensitive to touch. The irritation was not associated with itching, numbness, or pain, and has since resolved.  The patient also reports experiencing sharp, sporadic back pain for the past month and a half. The pain, located on the right side of the back, is triggered upon standing and lasts for approximately 10-20 seconds. The pain does not radiate down the leg and does not cause the patient to fall. The patient denies any known triggers for the pain and reports that it has not occurred in the past week.  He is currently taking metformin, glipizide, Jardiance,Trulicity for his diabetes and his A1c 7.0.  The patient reports having stopped taking Basaglar insulin for several months due to insurance issues and a belief that it was not needed. He is also on amlodipine, lisinopril/hydrochlorothiazide for hypertension, and atorvastatin for hyperlipidemia.       Past Medical History:  Diagnosis Date   Allergy    seasonal allergies   Diabetes mellitus without complication (HCC)    on meds   Diabetic peripheral neuropathy (HCC)    on meds   Hyperlipidemia    on meds   Hypertension    on meds   Neuromuscular disorder [facility])     Past Surgical History:  Procedure Laterality Date   WISDOM  TOOTH EXTRACTION      Family History  Problem Relation Age of Onset   Diabetes Mother    Diabetes Father    Colon cancer Neg Hx    Colon polyps Neg Hx    Esophageal cancer Neg Hx    Stomach cancer Neg Hx    Rectal cancer Neg Hx     Social History   Socioeconomic History   Marital status: Single    Spouse name: Not on file   Number of children: Not on file   Years of education: Not on file   Highest education level: Not on file  Occupational History   Not on file  Tobacco Use   Smoking status: Never   Smokeless tobacco: Never  Vaping Use   Vaping status: Never Used  Substance and Sexual Activity   Alcohol use: No   Drug use: No   Sexual activity: Not on file  Other Topics Concern   Not on file  Social History Narrative   Not on file   Social Drivers of Health   Financial Resource Strain: Low Risk  (01/31/2023)   Overall Financial Resource Strain (CARDIA)    Difficulty of Paying Living Expenses: Not hard at all  Food Insecurity: No Food Insecurity (01/31/2023)   Hunger Vital Sign    Worried About Running Out of Food in the Last Year: Never true    Ran Out of Food in the Last Year: Never true  Transportation Needs: No  Transportation Needs (01/31/2023)   PRAPARE - Administrator, Civil Service (Medical): No    Lack of Transportation (Non-Medical): No  Physical Activity: Patient Declined (01/31/2023)   Exercise Vital Sign    Days of Exercise per Week: Patient declined    Minutes of Exercise per Session: Patient declined  Stress: No Stress Concern Present (01/31/2023)   [employer] of Occupational Health - Occupational Stress Questionnaire    Feeling of Stress : Not at all  Social Connections: Unknown (01/31/2023)   Social Connection and Isolation Panel [NHANES]    Frequency of Communication with Friends and Family: Twice a week    Frequency of Social Gatherings with Friends and Family: Twice a week    Attends Religious Services: Not on Environmental health practitioner or Organizations: Yes    Attends Banker Meetings: 1 to 4 times per year    Marital Status: Married    No Known Allergies  Outpatient Medications Prior to Visit  Medication Sig Dispense Refill   Blood Pressure Monitoring (BLOOD PRESSURE CUFF) MISC 1 each by Does not apply route daily as needed. 1 each 0   diclofenac (VOLTAREN) 75 MG EC tablet Take 1 tablet (75 mg total) by mouth 2 (two) times daily as needed. 60 tablet 2   sildenafil (VIAGRA) 50 MG tablet Take 1 tablet (50 mg total) by mouth daily as needed for erectile dysfunction. 20 tablet 1   terbinafine (LAMISIL AT) 1 % cream Apply 1 Application topically 2 (two) times daily. 42 g 1   amLODipine (NORVASC) 10 MG tablet TAKE 1 TABLET BY MOUTH DAILY 90 tablet 0   atorvastatin (LIPITOR) 40 MG tablet Take 1 tablet (40 mg total) by mouth daily. 90 tablet 1   empagliflozin (JARDIANCE) 25 MG TABS tablet TAKE 1 TABLET BY MOUTH DAILY 30 tablet 0   gabapentin (NEURONTIN) 300 MG capsule TAKE 2 CAPSULES BY MOUTH EVERY MORNING AND TAKE THREE CAPSULES BY MOUTH EVERY EVENING 150 capsule 6   glipiZIDE (GLUCOTROL XL) 10 MG 24 hr tablet TAKE 2 TABLETS BY MOUTH EVERY MORNING WIHT BREAKFAST 60 tablet 3   Insulin Glargine (BASAGLAR KWIKPEN) 100 UNIT/ML Inject 20 Units into the skin at bedtime. 30 mL 6   lisinopril-hydrochlorothiazide (ZESTORETIC) 20-12.5 MG tablet Take 1 tablet by mouth daily. 90 tablet 1   metFORMIN (GLUCOPHAGE) 500 MG tablet TAKE 1 TABLET BY MOUTH 2 TIMES A DAY WITH A MEAL 180 tablet 0   Semaglutide, 2 MG/DOSE, (OZEMPIC, 2 MG/DOSE,) 8 MG/3ML SOPN DIAL AND INJECT UNDER THE SKIN 2 MG WEEKLY 9 mL 1   cephALEXin (KEFLEX) 500 MG capsule Take 1 capsule (500 mg total) by mouth 2 (two) times daily. (Patient not taking: Reported on 01/31/2023) 14 capsule 0   Insulin Pen Needle (BD PEN NEEDLE NANO 2ND GEN) 32G X 4 MM MISC USE DAILY WITH BASAGLAR (Patient not taking: Reported on 01/31/2023) 100 each 1   tiZANidine  (ZANAFLEX) 4 MG tablet Take 1 tablet (4 mg total) by mouth every 8 (eight) hours as needed. (Patient not taking: Reported on 01/31/2023) 60 tablet 1   0.9 %  sodium chloride infusion      No facility-administered medications prior to visit.     ROS Review of Systems  Constitutional:  Negative for activity change and appetite change.  HENT:  Negative for sinus pressure and sore throat.   Respiratory:  Negative for chest tightness, shortness of breath and wheezing.   Cardiovascular:  Negative for chest pain and palpitations.  Gastrointestinal:  Negative for abdominal distention, abdominal pain and constipation.  Genitourinary: Negative.   Musculoskeletal: Negative.   Psychiatric/Behavioral:  Negative for behavioral problems and dysphoric mood.     Objective:  BP 122/76   Pulse (!) 101   Ht 6' (1.829 m)   Wt 276 lb 9.6 oz (125.5 kg)   SpO2 97%   BMI 37.51 kg/m      01/31/2023    8:43 AM 07/18/2022    9:49 AM 04/18/2022    3:34 PM  BP/Weight  Systolic BP 122 120 144  Diastolic BP 76 77 81  Wt. (Lbs) 276.6 269 276.8  BMI 37.51 kg/m2 36.48 kg/m2 37.54 kg/m2      Physical Exam Constitutional:      Appearance: He is well-developed.  Cardiovascular:     Rate and Rhythm: Normal rate.     Heart sounds: Normal heart sounds. No murmur heard. Pulmonary:     Effort: Pulmonary effort is normal.     Breath sounds: Normal breath sounds. No wheezing or rales.  Chest:     Chest wall: No tenderness.  Abdominal:     General: Bowel sounds are normal. There is no distension.     Palpations: Abdomen is soft. There is no mass.     Tenderness: There is no abdominal tenderness.  Musculoskeletal:        General: Normal range of motion.     Lumbar back: No tenderness or bony tenderness. Negative right straight leg raise test and negative left straight leg raise test.     Right lower leg: No edema.     Left lower leg: No edema.  Neurological:     Mental Status: He is alert and oriented  to person, place, and time.  Psychiatric:        Mood and Affect: Mood normal.        Latest Ref Rng & Units 04/18/2022    4:03 PM 08/03/2021    8:51 AM 09/01/2020   10:53 AM  CMP  Glucose 70 - 99 mg/dL 71  161  096   BUN 6 - 24 mg/dL 7  8  11    Creatinine 0.76 - 1.27 mg/dL 0.45  4.09  8.11   Sodium 134 - 144 mmol/L 142  145  140   Potassium 3.5 - 5.2 mmol/L 4.3  4.5  4.4   Chloride 96 - 106 mmol/L 102  105  101   CO2 20 - 29 mmol/L 24  21  19    Calcium 8.7 - 10.2 mg/dL 9.6  9.5  9.7   Total Protein 6.0 - 8.5 g/dL 7.4  7.3  8.0   Total Bilirubin 0.0 - 1.2 mg/dL 1.3  0.6  0.9   Alkaline Phos 44 - 121 IU/L 93  86  94   AST 0 - 40 IU/L 20  11  20    ALT 0 - 44 IU/L 30  22  45     Lipid Panel     Component Value Date/Time   CHOL 82 (L) 04/18/2022 1603   TRIG 64 04/18/2022 1603   HDL 26 (L) 04/18/2022 1603   CHOLHDL 3.2 04/18/2022 1603   CHOLHDL 4.3 12/22/2015 1207   VLDL 11 12/22/2015 1207   LDLCALC 41 04/18/2022 1603    CBC    Component Value Date/Time   WBC 7.8 04/18/2022 1603   WBC 6.6 12/22/2015 1207   RBC 5.77 04/18/2022 1603   RBC 5.88 (  H) 12/22/2015 1207   HGB 15.5 04/18/2022 1603   HCT 46.0 04/18/2022 1603   PLT 246 04/18/2022 1603   MCV 80 04/18/2022 1603   MCH 26.9 04/18/2022 1603   MCH 27.2 12/22/2015 1207   MCHC 33.7 04/18/2022 1603   MCHC 34.3 12/22/2015 1207   RDW 14.8 04/18/2022 1603   LYMPHSABS 2.4 04/18/2022 1603   MONOABS 462 12/22/2015 1207   EOSABS 0.2 04/18/2022 1603   BASOSABS 0.1 04/18/2022 1603    Lab Results  Component Value Date   HGBA1C 6.1 07/18/2022    Assessment & Plan:      Intermittent Right Lower Back Pain Likely sciatica based on location and description. No radiation down the leg. No current pain. -Prescription tizanidine as needed -Provided patient with exercises for sciatica. -Will monitor and consider physical therapy if symptoms persist.  Skin Irritation Resolved at time of visit. No visible signs of irritation  or rash. -Advised patient to take a picture if symptoms reoccur before next visit.  Type 2 Diabetes Mellitus -Controlled but A1c increased from 6.1 to 7.0. Patient has stopped taking Basaglar for several months and Metformin dose was previously reduced. -Increase Metformin to 1000mg  twice daily. -Discontinue Basaglar. -Check A1c in 3 months.  Hyperlipidemia -Controlled On Atorvastatin. -Check cholesterol levels.  Hypertension On Amlodipine, Lisinopril, and Hydrochlorothiazide. -Blood pressure well controlled at visit.  General Health Maintenance -Administer influenza vaccine today. -Follow-up in 3 months.          Meds ordered this encounter  Medications   metFORMIN (GLUCOPHAGE) 500 MG tablet    Sig: Take 2 tablets (1,000 mg total) by mouth 2 (two) times daily with a meal.    Dispense:  360 tablet    Refill:  1    Dose increase. Discontinue Basaglar   amLODipine (NORVASC) 10 MG tablet    Sig: Take 1 tablet (10 mg total) by mouth daily.    Dispense:  90 tablet    Refill:  1   atorvastatin (LIPITOR) 40 MG tablet    Sig: Take 1 tablet (40 mg total) by mouth daily.    Dispense:  90 tablet    Refill:  1   empagliflozin (JARDIANCE) 25 MG TABS tablet    Sig: Take 1 tablet (25 mg total) by mouth daily.    Dispense:  90 tablet    Refill:  1   gabapentin (NEURONTIN) 300 MG capsule    Sig: TAKE 2 CAPSULES BY MOUTH EVERY MORNING AND TAKE THREE CAPSULES BY MOUTH EVERY EVENING    Dispense:  150 capsule    Refill:  6   glipiZIDE (GLUCOTROL XL) 10 MG 24 hr tablet    Sig: Take 1 tablet (10 mg total) by mouth daily with breakfast.    Dispense:  180 tablet    Refill:  1   lisinopril-hydrochlorothiazide (ZESTORETIC) 20-12.5 MG tablet    Sig: Take 1 tablet by mouth daily.    Dispense:  90 tablet    Refill:  1   Semaglutide, 2 MG/DOSE, (OZEMPIC, 2 MG/DOSE,) 8 MG/3ML SOPN    Sig: DIAL AND INJECT UNDER THE SKIN 2 MG WEEKLY    Dispense:  9 mL    Refill:  1   tiZANidine (ZANAFLEX)  4 MG tablet    Sig: Take 1 tablet (4 mg total) by mouth every 8 (eight) hours as needed.    Dispense:  60 tablet    Refill:  1    Follow-up: Return in about 3 months (around  05/01/2023) for Chronic medical conditions.       Hoy Register, MD, FAAFP. [facility] [place], [place] [phone]   01/31/2023, 2:00 PM

## 2023-02-01 LAB — CMP14+EGFR
ALT: 34 [IU]/L (ref 0–44)
AST: 18 [IU]/L (ref 0–40)
Albumin: 4.5 g/dL (ref 3.8–4.9)
Alkaline Phosphatase: 99 [IU]/L (ref 44–121)
BUN/Creatinine Ratio: 10 (ref 9–20)
BUN: 12 mg/dL (ref 6–24)
Bilirubin Total: 0.8 mg/dL (ref 0.0–1.2)
CO2: 20 mmol/L (ref 20–29)
Calcium: 9.9 mg/dL (ref 8.7–10.2)
Chloride: 101 mmol/L (ref 96–106)
Creatinine, Ser: 1.23 mg/dL (ref 0.76–1.27)
Globulin, Total: 2.9 g/dL (ref 1.5–4.5)
Glucose: 167 mg/dL — ABNORMAL HIGH (ref 70–99)
Potassium: 4.5 mmol/L (ref 3.5–5.2)
Sodium: 140 mmol/L (ref 134–144)
Total Protein: 7.4 g/dL (ref 6.0–8.5)
eGFR: 70 mL/min/{1.73_m2} (ref >59)

## 2023-02-01 LAB — LP+NON-HDL CHOLESTEROL
Cholesterol, Total: 104 mg/dL (ref 100–199)
HDL: 27 mg/dL — ABNORMAL LOW (ref >39)
LDL Chol Calc (NIH): 56 mg/dL (ref 0–99)
Total Non-HDL-Chol (LDL+VLDL): 77 mg/dL (ref 0–129)
Triglycerides: 114 mg/dL (ref 0–149)
VLDL Cholesterol Cal: 21 mg/dL (ref 5–40)

## 2023-02-12 ENCOUNTER — Ambulatory Visit: Payer: BC Managed Care – PPO | Admitting: Physician Assistant

## 2023-04-25 ENCOUNTER — Ambulatory Visit: Payer: BC Managed Care – PPO | Admitting: Podiatry

## 2023-05-07 ENCOUNTER — Ambulatory Visit
Admission: RE | Admit: 2023-05-07 | Discharge: 2023-05-07 | Disposition: A | Discharge status: Home / Self Care | Source: Ambulatory Visit | Attending: Family Medicine | Admitting: Family Medicine

## 2023-05-07 ENCOUNTER — Encounter: Payer: Self-pay | Admitting: Family Medicine

## 2023-05-07 ENCOUNTER — Ambulatory Visit: Payer: BC Managed Care – PPO | Attending: Family Medicine | Admitting: Family Medicine

## 2023-05-07 VITALS — BP 114/73 | HR 93 | Ht 72.0 in | Wt 276.0 lb

## 2023-05-07 DIAGNOSIS — M5441 Lumbago with sciatica, right side: Secondary | ICD-10-CM

## 2023-05-07 DIAGNOSIS — Z7985 Long-term (current) use of injectable non-insulin antidiabetic drugs: Secondary | ICD-10-CM | POA: Diagnosis not present

## 2023-05-07 DIAGNOSIS — E1169 Type 2 diabetes mellitus with other specified complication: Secondary | ICD-10-CM

## 2023-05-07 DIAGNOSIS — E785 Hyperlipidemia, unspecified: Secondary | ICD-10-CM

## 2023-05-07 DIAGNOSIS — G8929 Other chronic pain: Secondary | ICD-10-CM | POA: Diagnosis not present

## 2023-05-07 DIAGNOSIS — E1140 Type 2 diabetes mellitus with diabetic neuropathy, unspecified: Secondary | ICD-10-CM

## 2023-05-07 DIAGNOSIS — I152 Hypertension secondary to endocrine disorders: Secondary | ICD-10-CM

## 2023-05-07 DIAGNOSIS — Z23 Encounter for immunization: Secondary | ICD-10-CM

## 2023-05-07 DIAGNOSIS — Z7984 Long term (current) use of oral hypoglycemic drugs: Secondary | ICD-10-CM

## 2023-05-07 DIAGNOSIS — I10 Essential (primary) hypertension: Secondary | ICD-10-CM

## 2023-05-07 LAB — POCT GLYCOSYLATED HEMOGLOBIN (HGB A1C): HbA1c, POC (controlled diabetic range): 7 % (ref 0.0–7.0)

## 2023-05-07 MED ORDER — ACCU-CHEK GUIDE W/DEVICE KIT: PACK | 0 refills | Status: AC

## 2023-05-07 MED ORDER — LISINOPRIL-HYDROCHLOROTHIAZIDE 20-12.5 MG PO TABS: 1.0000 | ORAL_TABLET | Freq: Every day | ORAL | 1 refills | Status: DC

## 2023-05-07 MED ORDER — GLIPIZIDE ER 10 MG PO TB24: 10.0000 mg | ORAL_TABLET | Freq: Every day | ORAL | 1 refills | Status: DC

## 2023-05-07 MED ORDER — ACCU-CHEK GUIDE TEST VI STRP: ORAL_STRIP | 12 refills | Status: AC

## 2023-05-07 MED ORDER — GABAPENTIN 300 MG PO CAPS: ORAL_CAPSULE | ORAL | 6 refills | Status: DC

## 2023-05-07 MED ORDER — ATORVASTATIN CALCIUM 40 MG PO TABS: 40.0000 mg | ORAL_TABLET | Freq: Every day | ORAL | 1 refills | Status: DC

## 2023-05-07 MED ORDER — AMLODIPINE BESYLATE 10 MG PO TABS: 10.0000 mg | ORAL_TABLET | Freq: Every day | ORAL | 1 refills | Status: DC

## 2023-05-07 MED ORDER — OZEMPIC (2 MG/DOSE) 8 MG/3ML ~~LOC~~ SOPN: PEN_INJECTOR | SUBCUTANEOUS | 1 refills | Status: DC

## 2023-05-07 MED ORDER — EMPAGLIFLOZIN 25 MG PO TABS: 25.0000 mg | ORAL_TABLET | Freq: Every day | ORAL | 1 refills | Status: DC

## 2023-05-07 MED ORDER — METFORMIN HCL 500 MG PO TABS: 1000.0000 mg | ORAL_TABLET | Freq: Two times a day (BID) | ORAL | 1 refills | Status: DC

## 2023-05-07 MED ORDER — ACCU-CHEK SOFTCLIX LANCETS MISC: 12 refills | Status: AC

## 2023-05-07 NOTE — Progress Notes (Signed)
 Subjective:  Patient ID: [place] Vincent, male    DOB: 07/26/1970  Age: 53 y.o. MRN: [id number]  CC: Medical Management of Chronic Issues (Back pain)     Discussed the use of AI scribe software for clinical note transcription with the patient, who gave verbal consent to proceed.  History of Present Illness The patient, with a history of 2 diabetes mellitus, diabetic neuropathy, hypertension, hyperlipidemia, presents with worsening lower back pain over the past few months. The pain is intermittent, with some days better than others. The pain is severe enough to interrupt activities, particularly when standing up or stretching out. The pain is localized to the lower back and does not radiate down the legs. The patient has tried physical therapy and tizanidine, a muscle relaxant, with minimal relief.  Regarding his diabetes, the patient's last A1c was 7, which has been stable. He reports a recent medication error where he was taking glipizide twice a day instead of once a day for about a month. He has since corrected this error. He also takes metformin twice a day, Ozempic once a week, and Jardiance daily. He reports occasional pain in his feet, which he attributes to neuropathy. He is currently taking gabapentin for this.  The patient also reports a history of hypertension, which is controlled with amlodipine. He is also on Lipitor for cholesterol management.    Past Medical History:  Diagnosis Date   Allergy    seasonal allergies   Diabetes mellitus without complication (HCC)    on meds   Diabetic peripheral neuropathy (HCC)    on meds   Hyperlipidemia    on meds   Hypertension    on meds   Neuromuscular disorder [facility])     Past Surgical History:  Procedure Laterality Date   WISDOM TOOTH EXTRACTION      Family History  Problem Relation Age of Onset   Diabetes Mother    Diabetes Father    Colon cancer Neg Hx    Colon polyps Neg Hx    Esophageal cancer Neg Hx    Stomach  cancer Neg Hx    Rectal cancer Neg Hx     Social History   Socioeconomic History   Marital status: Single    Spouse name: Not on file   Number of children: Not on file   Years of education: Not on file   Highest education level: Not on file  Occupational History   Not on file  Tobacco Use   Smoking status: Never   Smokeless tobacco: Never  Vaping Use   Vaping status: Never Used  Substance and Sexual Activity   Alcohol use: No   Drug use: No   Sexual activity: Not on file  Other Topics Concern   Not on file  Social History Narrative   Not on file   Social Drivers of Health   Financial Resource Strain: Low Risk  (01/31/2023)   Overall Financial Resource Strain (CARDIA)    Difficulty of Paying Living Expenses: Not hard at all  Food Insecurity: No Food Insecurity (01/31/2023)   Hunger Vital Sign    Worried About Running Out of Food in the Last Year: Never true    Ran Out of Food in the Last Year: Never true  Transportation Needs: No Transportation Needs (01/31/2023)   PRAPARE - Administrator, Civil Service (Medical): No    Lack of Transportation (Non-Medical): No  Physical Activity: Patient Declined (01/31/2023)   Exercise Vital Sign  Days of Exercise per Week: Patient declined    Minutes of Exercise per Session: Patient declined  Stress: No Stress Concern Present (01/31/2023)   [employer] of Occupational Health - Occupational Stress Questionnaire    Feeling of Stress : Not at all  Social Connections: Unknown (01/31/2023)   Social Connection and Isolation Panel [NHANES]    Frequency of Communication with Friends and Family: Twice a week    Frequency of Social Gatherings with Friends and Family: Twice a week    Attends Religious Services: Not on Marketing executive or Organizations: Yes    Attends Banker Meetings: 1 to 4 times per year    Marital Status: Married    No Known Allergies  Outpatient Medications Prior to  Visit  Medication Sig Dispense Refill   Blood Pressure Monitoring (BLOOD PRESSURE CUFF) MISC 1 each by Does not apply route daily as needed. 1 each 0   diclofenac (VOLTAREN) 75 MG EC tablet Take 1 tablet (75 mg total) by mouth 2 (two) times daily as needed. 60 tablet 2   Insulin Pen Needle (BD PEN NEEDLE NANO 2ND GEN) 32G X 4 MM MISC USE DAILY WITH BASAGLAR 100 each 1   sildenafil (VIAGRA) 50 MG tablet Take 1 tablet (50 mg total) by mouth daily as needed for erectile dysfunction. 20 tablet 1   tiZANidine (ZANAFLEX) 4 MG tablet Take 1 tablet (4 mg total) by mouth every 8 (eight) hours as needed. 60 tablet 1   amLODipine (NORVASC) 10 MG tablet Take 1 tablet (10 mg total) by mouth daily. 90 tablet 1   atorvastatin (LIPITOR) 40 MG tablet Take 1 tablet (40 mg total) by mouth daily. 90 tablet 1   empagliflozin (JARDIANCE) 25 MG TABS tablet Take 1 tablet (25 mg total) by mouth daily. 90 tablet 1   gabapentin (NEURONTIN) 300 MG capsule TAKE 2 CAPSULES BY MOUTH EVERY MORNING AND TAKE THREE CAPSULES BY MOUTH EVERY EVENING 150 capsule 6   glipiZIDE (GLUCOTROL XL) 10 MG 24 hr tablet Take 1 tablet (10 mg total) by mouth daily with breakfast. 180 tablet 1   lisinopril-hydrochlorothiazide (ZESTORETIC) 20-12.5 MG tablet Take 1 tablet by mouth daily. 90 tablet 1   metFORMIN (GLUCOPHAGE) 500 MG tablet Take 2 tablets (1,000 mg total) by mouth 2 (two) times daily with a meal. 360 tablet 1   Semaglutide, 2 MG/DOSE, (OZEMPIC, 2 MG/DOSE,) 8 MG/3ML SOPN DIAL AND INJECT UNDER THE SKIN 2 MG WEEKLY 9 mL 1   cephALEXin (KEFLEX) 500 MG capsule Take 1 capsule (500 mg total) by mouth 2 (two) times daily. (Patient not taking: Reported on 01/24/2023) 14 capsule 0   terbinafine (LAMISIL AT) 1 % cream Apply 1 Application topically 2 (two) times daily. (Patient not taking: Reported on 05/07/2023) 42 g 1   No facility-administered medications prior to visit.     ROS Review of Systems  Constitutional:  Negative for activity change  and appetite change.  HENT:  Negative for sinus pressure and sore throat.   Respiratory:  Negative for chest tightness, shortness of breath and wheezing.   Cardiovascular:  Negative for chest pain and palpitations.  Gastrointestinal:  Negative for abdominal distention, abdominal pain and constipation.  Genitourinary: Negative.   Musculoskeletal:  Positive for back pain.  Psychiatric/Behavioral:  Negative for behavioral problems and dysphoric mood.     Objective:  BP 114/73   Pulse 93   Ht 6' (1.829 m)   Wt 276 lb (125.2  kg)   SpO2 98%   BMI 37.43 kg/m      05/07/2023    9:12 AM 01/31/2023    8:43 AM 07/18/2022    9:49 AM  BP/Weight  Systolic BP 114 122 120  Diastolic BP 73 76 77  Wt. (Lbs) 276 276.6 269  BMI 37.43 kg/m2 37.51 kg/m2 36.48 kg/m2      Physical Exam Constitutional:      Appearance: He is well-developed.  Cardiovascular:     Rate and Rhythm: Normal rate.     Heart sounds: Normal heart sounds. No murmur heard. Pulmonary:     Effort: Pulmonary effort is normal.     Breath sounds: Normal breath sounds. No wheezing or rales.  Chest:     Chest wall: No tenderness.  Abdominal:     General: Bowel sounds are normal. There is no distension.     Palpations: Abdomen is soft. There is no mass.     Tenderness: There is no abdominal tenderness.  Musculoskeletal:        General: Normal range of motion.     Right lower leg: No edema.     Left lower leg: No edema.  Neurological:     Mental Status: He is alert and oriented to person, place, and time.  Psychiatric:        Mood and Affect: Mood normal.        Latest Ref Rng & Units 01/31/2023    9:36 AM 04/18/2022    4:03 PM 08/03/2021    8:51 AM  CMP  Glucose 70 - 99 mg/dL 161  71  096   BUN 6 - 24 mg/dL 12  7  8    Creatinine 0.76 - 1.27 mg/dL 0.45  4.09  8.11   Sodium 134 - 144 mmol/L 140  142  145   Potassium 3.5 - 5.2 mmol/L 4.5  4.3  4.5   Chloride 96 - 106 mmol/L 101  102  105   CO2 20 - 29 mmol/L 20   24  21    Calcium 8.7 - 10.2 mg/dL 9.9  9.6  9.5   Total Protein 6.0 - 8.5 g/dL 7.4  7.4  7.3   Total Bilirubin 0.0 - 1.2 mg/dL 0.8  1.3  0.6   Alkaline Phos 44 - 121 IU/L 99  93  86   AST 0 - 40 IU/L 18  20  11    ALT 0 - 44 IU/L 34  30  22     Lipid Panel     Component Value Date/Time   CHOL 104 01/31/2023 0936   TRIG 114 01/31/2023 0936   HDL 27 (L) 01/31/2023 0936   CHOLHDL 3.2 04/18/2022 1603   CHOLHDL 4.3 12/22/2015 1207   VLDL 11 12/22/2015 1207   LDLCALC 56 01/31/2023 0936    CBC    Component Value Date/Time   WBC 7.8 04/18/2022 1603   WBC 6.6 12/22/2015 1207   RBC 5.77 04/18/2022 1603   RBC 5.88 (H) 12/22/2015 1207   HGB 15.5 04/18/2022 1603   HCT 46.0 04/18/2022 1603   PLT 246 04/18/2022 1603   MCV 80 04/18/2022 1603   MCH 26.9 04/18/2022 1603   MCH 27.2 12/22/2015 1207   MCHC 33.7 04/18/2022 1603   MCHC 34.3 12/22/2015 1207   RDW 14.8 04/18/2022 1603   LYMPHSABS 2.4 04/18/2022 1603   MONOABS 462 12/22/2015 1207   EOSABS 0.2 04/18/2022 1603   BASOSABS 0.1 04/18/2022 1603    Lab Results  Component Value Date   HGBA1C 7.0 05/07/2023       Assessment & Plan Intermittent Right Lower Back Pain Chronic intermittent right lower back pain, worsening over months, unresponsive to tizanidine and physical therapy. - Order lumbar spine X-ray. - Refer to orthopedic specialist.  Type 2 Diabetes Mellitus Type 2 diabetes with A1c at 7. Medication error with glipizide corrected. No hypoglycemia reported. - Continue glipizide 10 mg extended release once daily. - Continue metformin 1000 mg twice daily. - Continue semaglutide 2 mg once weekly. - Continue empagliflozin 25 mg daily. - Monitor blood glucose levels closely. - Order new glucose monitor based on insurance coverage.  Diabetic Peripheral Neuropathy Chronic diabetic peripheral neuropathy with intermittent foot pain, managed with gabapentin. - Continue gabapentin 300 mg, 2 capsules in the morning and 3  capsules in the evening.  Hypertension Hypertension well-controlled with current regimen. - Continue amlodipine 10 mg daily. -Counseled on blood pressure goal of less than 130/80, low-sodium, DASH diet, medication compliance, 150 minutes of moderate intensity exercise per week. Discussed medication compliance, adverse effects.   Hyperlipidemia Hyperlipidemia managed with atorvastatin. - Continue atorvastatin 40 mg daily.  General Health Maintenance Due for pneumonia vaccination. - Administer pneumonia vaccine today.  Follow-up Plans for monitoring diabetes management and back pain evaluation. - Follow up in 6 months for diabetes management. - Complete lumbar spine X-ray before orthopedic appointment.      Meds ordered this encounter  Medications   amLODipine (NORVASC) 10 MG tablet    Sig: Take 1 tablet (10 mg total) by mouth daily.    Dispense:  90 tablet    Refill:  1   atorvastatin (LIPITOR) 40 MG tablet    Sig: Take 1 tablet (40 mg total) by mouth daily.    Dispense:  90 tablet    Refill:  1   empagliflozin (JARDIANCE) 25 MG TABS tablet    Sig: Take 1 tablet (25 mg total) by mouth daily.    Dispense:  90 tablet    Refill:  1   gabapentin (NEURONTIN) 300 MG capsule    Sig: TAKE 2 CAPSULES BY MOUTH EVERY MORNING AND TAKE THREE CAPSULES BY MOUTH EVERY EVENING    Dispense:  150 capsule    Refill:  6   glipiZIDE (GLUCOTROL XL) 10 MG 24 hr tablet    Sig: Take 1 tablet (10 mg total) by mouth daily with breakfast.    Dispense:  180 tablet    Refill:  1   lisinopril-hydrochlorothiazide (ZESTORETIC) 20-12.5 MG tablet    Sig: Take 1 tablet by mouth daily.    Dispense:  90 tablet    Refill:  1   metFORMIN (GLUCOPHAGE) 500 MG tablet    Sig: Take 2 tablets (1,000 mg total) by mouth 2 (two) times daily with a meal.    Dispense:  360 tablet    Refill:  1   Semaglutide, 2 MG/DOSE, (OZEMPIC, 2 MG/DOSE,) 8 MG/3ML SOPN    Sig: DIAL AND INJECT UNDER THE SKIN 2 MG WEEKLY     Dispense:  9 mL    Refill:  1   Blood Glucose Monitoring Suppl (ACCU-CHEK GUIDE) w/Device KIT    Sig: Use as directed tid    Dispense:  1 kit    Refill:  0   Accu-Chek Softclix Lancets lancets    Sig: Use as instructed    Dispense:  100 each    Refill:  12   glucose blood (ACCU-CHEK GUIDE TEST) test strip  Sig: Use as instructed    Dispense:  100 each    Refill:  12    Follow-up: No follow-ups on file.       Hoy Register, MD, FAAFP. [facility] [place], [place] [phone]   05/07/2023, 10:49 AM

## 2023-05-07 NOTE — Patient Instructions (Addendum)
 VISIT SUMMARY:  During today's visit, we discussed your worsening lower back pain, diabetes management, diabetic neuropathy, hypertension, and cholesterol levels. We also reviewed your recent medication error and made plans for further evaluation and treatment.  YOUR PLAN:  -INTERMITTENT RIGHT LOWER BACK PAIN: You have been experiencing worsening lower back pain that has not improved with physical therapy or muscle relaxants. We will order an X-ray of your lumbar spine and refer you to an orthopedic specialist for further evaluation.  -TYPE 2 DIABETES MELLITUS: Your diabetes is currently stable with an A1c of 7. We discussed a recent medication error with glipizide, which has been corrected. Continue taking your current medications: glipizide 10 mg once daily, metformin 1000 mg twice daily, semaglutide 2 mg once weekly, and empagliflozin 25 mg daily. Monitor your blood glucose levels closely, and we we will send a prescription for a new glucose monitor based on your insurance coverage.  -DIABETIC PERIPHERAL NEUROPATHY: You have diabetic peripheral neuropathy, which causes occasional foot pain. Continue taking gabapentin 300 mg, 2 capsules in the morning and 3 capsules in the evening to manage this pain.  -HYPERTENSION: Your hypertension is well-controlled with your current medication. Continue taking amlodipine 10 mg daily.  -HYPERLIPIDEMIA: Your cholesterol levels are being managed with atorvastatin. Continue taking atorvastatin 40 mg daily.  -GENERAL HEALTH MAINTENANCE: You are due for a pneumonia vaccination, which we will administer today.  INSTRUCTIONS:  Please follow up in 6 months for diabetes management. Complete the lumbar spine X-ray before your orthopedic appointment.

## 2023-05-14 ENCOUNTER — Encounter: Payer: Self-pay | Admitting: Family Medicine

## 2023-05-30 ENCOUNTER — Ambulatory Visit: Admitting: Orthopedic Surgery

## 2023-06-26 ENCOUNTER — Other Ambulatory Visit (INDEPENDENT_AMBULATORY_CARE_PROVIDER_SITE_OTHER): Payer: Self-pay

## 2023-06-26 ENCOUNTER — Ambulatory Visit: Admitting: Orthopedic Surgery

## 2023-06-26 VITALS — BP 114/80 | HR 123 | Ht 72.0 in | Wt 276.0 lb

## 2023-06-26 DIAGNOSIS — M545 Low back pain, unspecified: Secondary | ICD-10-CM | POA: Diagnosis not present

## 2023-06-26 NOTE — Progress Notes (Signed)
 Orthopedic Spine Surgery Office Note  Assessment: Patient is a 53 y.o. male with lower lumbar back pain.  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  Pain is worse with bending, so I feel that his pain is discogenic in nature   Plan: -Explained that initially conservative treatment is tried as a significant number of patients may experience relief with these treatment modalities. Discussed that the conservative treatments include:  -activity modification  -physical therapy  -over the counter pain medications  -medrol  dosepak  -lumbar steroid injections -Patient has tried home exercise program, PT, tizanidine  -Recommended Tylenol up to 1000mg  3 times per day and naproxen  500 mg twice daily to be taken when he is more active on a scheduled basis.  Unfortunately, because his pain is so quick on and off there is not a medication that I think will be effective for treating his pain since by the time he takes it this pain would have resolved -Encouraged him to continue to work on core strengthening.  Discussed proper lifting technique -Could consider injections in the future -Weight loss would likely be helpful to offload the discs -Patient should return to office in 8 weeks, x-rays at next visit: None   Patient expressed understanding of the plan and all questions were answered to the patient's satisfaction.   ___________________________________________________________________________   History:  Patient is a 53 y.o. male who presents today for lumbar spine.  Patient has had about 4 months of lower lumbar back pain.  There is no trauma or injury that preceded the onset of the pain.  He feels it in the lower lumbar region.  He does not have any pain radiating into either lower extremity.  He does have chronic numbness and paresthesias in his feet due to neuropathy.  He does not have any other numbness or paresthesias.  He has tried physical therapy and home exercise program along  with tizanidine  but did not notice any relief with those modalities.  He says that the pain is quick on and quick off.  He says that the pain will last about 5 seconds when he changes positions and then it gets better.  He also notes that the pain is particularly worse in the little more consistent if he is doing more during that day.   Weakness: Denies Symptoms of imbalance: Denies Paresthesias and numbness: Yes ,in his feet from diabetic neuropathy. No recent changes Bowel or bladder incontinence: Denies Saddle anesthesia: Denies  Treatments tried: home exercise program, PT, tizanidine   Review of systems: Denies fevers and chills, night sweats, unexplained weight loss, history of cancer, pain that wakes him at night  Past medical history: DM (last A1c was 7.0 on 05/07/2023) HTN HLD  Allergies: NKDA  Past surgical history:  Wisdom teeth extraction  Social history: Denies use of nicotine product (smoking, vaping, patches, smokeless) Alcohol use: denies Denies recreational drug use   Physical Exam:  BMI of 37.4  General: no acute distress, appears stated age Neurologic: alert, answering questions appropriately, following commands Respiratory: unlabored breathing on room air, symmetric chest rise Psychiatric: appropriate affect, normal cadence to speech   MSK (spine):  -Strength exam      Left  Right EHL    5/5  5/5 TA    5/5  5/5 GSC    5/5  5/5 Knee extension  5/5  5/5 Hip flexion   5/5  5/5  -Sensory exam    Sensation intact to light touch in L3-S1 nerve distributions of  bilateral lower extremities  -Achilles DTR: 2/4 on the left, 2/4 on the right -Patellar tendon DTR: 2/4 on the left, 2/4 on the right  -Straight leg raise: Negative bilaterally -Femoral nerve stretch test: Negative bilaterally -Clonus: no beats bilaterally  -Left hip exam: No pain through range of motion, negative Stinchfield, negative FABER -Right hip exam: No pain through range of  motion, negative Stinchfield, negative FABER  Imaging: XRs of the lumbar spine from 06/26/2023 were independently reviewed and interpreted, showing disc height loss at L4/5 and L5/S1 with anterior osteophyte formation. No other significant degenerative changes seen. No fracture or dislocation seen. No evidence of instability on flexion/extension views.    Patient name: Anthony Vincent Patient MRN: [id number] Date of visit: 06/26/23

## 2023-07-24 ENCOUNTER — Other Ambulatory Visit: Payer: Self-pay | Admitting: Family Medicine

## 2023-07-24 DIAGNOSIS — Z7985 Long-term (current) use of injectable non-insulin antidiabetic drugs: Secondary | ICD-10-CM

## 2023-07-24 DIAGNOSIS — E1169 Type 2 diabetes mellitus with other specified complication: Secondary | ICD-10-CM

## 2023-08-26 ENCOUNTER — Ambulatory Visit: Admitting: Orthopedic Surgery

## 2023-09-12 ENCOUNTER — Other Ambulatory Visit: Payer: Self-pay | Admitting: Family Medicine

## 2023-09-12 DIAGNOSIS — E1159 Type 2 diabetes mellitus with other circulatory complications: Secondary | ICD-10-CM

## 2023-09-16 ENCOUNTER — Ambulatory Visit: Admitting: Orthopedic Surgery

## 2023-09-16 DIAGNOSIS — M545 Low back pain, unspecified: Secondary | ICD-10-CM

## 2023-09-16 NOTE — Progress Notes (Signed)
 Orthopedic Spine Surgery Office Note   Assessment: Patient is a 53 y.o. male with lower lumbar back pain.  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  Pain is worse with bending, so I feel that his pain is discogenic in nature     Plan: -Patient has tried home exercise program, PT, tizanidine  -Patient is doing much better since he was last seen in the office, so recommend no further intervention at this time -Told him to continue with core exercises and continue to work on weight loss as a preventative measure -Patient should return to office on an as-needed basis     Patient expressed understanding of the plan and all questions were answered to the patient's satisfaction.    ___________________________________________________________________________     History:   Patient is a 53 y.o. male who presents today for follow-up on his lumbar spine.  Patient has been doing much better since he was last seen in the office.  He said he has no consistent pain anymore in his back.  He has been able to do what ever he wants to do without issue.  He has no pain radiating to either lower extremity.   Treatments tried: home exercise program, PT, tizanidine      Physical Exam:   General: no acute distress, appears stated age Neurologic: alert, answering questions appropriately, following commands Respiratory: unlabored breathing on room air, symmetric chest rise Psychiatric: appropriate affect, normal cadence to speech     MSK (spine):   -Strength exam                                                   Left                  Right EHL                              5/5                  5/5 TA                                 5/5                  5/5 GSC                             5/5                  5/5 Knee extension            5/5                  5/5 Hip flexion                    5/5                  5/5   -Sensory exam                           Sensation intact to light  touch in L3-S1 nerve distributions of bilateral lower extremities    Imaging: XRs  of the lumbar spine from 06/26/2023 were independently reviewed and interpreted, showing disc height loss at L4/5 and L5/S1 with anterior osteophyte formation. No other significant degenerative changes seen. No fracture or dislocation seen. No evidence of instability on flexion/extension views.      Patient name: [place] Vincent Patient MRN: [id number] Date of visit: 09/16/23

## 2023-12-10 ENCOUNTER — Other Ambulatory Visit: Payer: Self-pay | Admitting: Family Medicine

## 2023-12-10 DIAGNOSIS — Z7984 Long term (current) use of oral hypoglycemic drugs: Secondary | ICD-10-CM

## 2023-12-10 DIAGNOSIS — I152 Hypertension secondary to endocrine disorders: Secondary | ICD-10-CM

## 2023-12-10 DIAGNOSIS — E1169 Type 2 diabetes mellitus with other specified complication: Secondary | ICD-10-CM

## 2023-12-23 ENCOUNTER — Encounter: Payer: Self-pay | Admitting: Radiology

## 2023-12-25 ENCOUNTER — Other Ambulatory Visit: Payer: Self-pay | Admitting: Family Medicine

## 2023-12-25 DIAGNOSIS — Z7984 Long term (current) use of oral hypoglycemic drugs: Secondary | ICD-10-CM

## 2023-12-25 DIAGNOSIS — E1169 Type 2 diabetes mellitus with other specified complication: Secondary | ICD-10-CM

## 2024-01-04 ENCOUNTER — Other Ambulatory Visit: Payer: Self-pay | Admitting: Family Medicine

## 2024-01-04 DIAGNOSIS — E1169 Type 2 diabetes mellitus with other specified complication: Secondary | ICD-10-CM

## 2024-01-18 ENCOUNTER — Other Ambulatory Visit: Payer: Self-pay | Admitting: Family Medicine

## 2024-01-18 DIAGNOSIS — E1159 Type 2 diabetes mellitus with other circulatory complications: Secondary | ICD-10-CM

## 2024-01-18 DIAGNOSIS — E1169 Type 2 diabetes mellitus with other specified complication: Secondary | ICD-10-CM

## 2024-01-18 DIAGNOSIS — Z7984 Long term (current) use of oral hypoglycemic drugs: Secondary | ICD-10-CM

## 2024-01-22 ENCOUNTER — Other Ambulatory Visit: Payer: Self-pay | Admitting: Family Medicine

## 2024-01-22 DIAGNOSIS — Z7984 Long term (current) use of oral hypoglycemic drugs: Secondary | ICD-10-CM

## 2024-01-22 DIAGNOSIS — E1169 Type 2 diabetes mellitus with other specified complication: Secondary | ICD-10-CM

## 2024-02-19 ENCOUNTER — Other Ambulatory Visit: Payer: Self-pay | Admitting: Family Medicine

## 2024-02-19 DIAGNOSIS — E1159 Type 2 diabetes mellitus with other circulatory complications: Secondary | ICD-10-CM

## 2024-02-19 DIAGNOSIS — E1169 Type 2 diabetes mellitus with other specified complication: Secondary | ICD-10-CM

## 2024-02-19 DIAGNOSIS — Z7984 Long term (current) use of oral hypoglycemic drugs: Secondary | ICD-10-CM

## 2024-02-19 NOTE — Telephone Encounter (Unsigned)
 Copied from CRM [id number]. Topic: Clinical - Medication Refill >> Feb 19, 2024  1:36 PM Shardie S wrote: Medication: amLODipine  (NORVASC ) 10 MG tablet metFORMIN  (GLUCOPHAGE ) 500 MG tablet empagliflozin  (JARDIANCE ) 25 MG TABS tablet  Has the patient contacted their pharmacy? Yes (Agent: If no, request that the patient contact the pharmacy for the refill. If patient does not wish to contact the pharmacy document the reason why and proceed with request.) (Agent: If yes, when and what did the pharmacy advise?)  This is the patient's preferred pharmacy:  [facility] [id number] [place], [place] - [address] [place] [address] Phone: [phone] Fax: [phone]  Is this the correct pharmacy for this prescription? Yes If no, delete pharmacy and type the correct one.   Has the prescription been filled recently? No  Is the patient out of the medication? Yes  Has the patient been seen for an appointment in the last year OR does the patient have an upcoming appointment? Yes  Can we respond through MyChart? Yes  Agent: Please be advised that Rx refills may take up to 3 business days. We ask that you follow-up with your pharmacy.

## 2024-02-21 MED ORDER — AMLODIPINE BESYLATE 10 MG PO TABS: 10.0000 mg | ORAL_TABLET | Freq: Every day | ORAL | 0 refills | Status: DC

## 2024-02-21 MED ORDER — EMPAGLIFLOZIN 25 MG PO TABS: 25.0000 mg | ORAL_TABLET | Freq: Every day | ORAL | 0 refills | Status: DC

## 2024-02-21 MED ORDER — METFORMIN HCL 500 MG PO TABS: 1000.0000 mg | ORAL_TABLET | Freq: Two times a day (BID) | ORAL | 0 refills | Status: DC

## 2024-02-21 NOTE — Telephone Encounter (Signed)
 Requested medication (s) are due for refill today: yes  Requested medication (s) are on the active medication list: yes  Last refill:  12/11/23  Future visit scheduled: yes  Notes to clinic:  Unable to refill per protocol, courtesy refill already given, routing for provider approval.      Requested Prescriptions  Pending Prescriptions Disp Refills   amLODipine  (NORVASC ) 10 MG tablet 30 tablet 0    Sig: Take 1 tablet (10 mg total) by mouth daily.     Cardiovascular: Calcium  Channel Blockers 2 Failed - 02/21/2024  9:07 AM      Failed - Last Heart Rate in normal range    Pulse Readings from Last 1 Encounters:  06/26/23 (!) 123         Failed - Valid encounter within last 6 months    Recent Outpatient Visits           9 months ago Type 2 diabetes mellitus with other specified complication, without long-term current use of insulin  (HCC)   [facility] [place]. [facility] Delbert Clam, MD   1 year ago Type 2 diabetes mellitus with other specified complication, without long-term current use of insulin  [facility])   [facility] [place] [facility] [place]. [facility] Delbert Clam, MD   1 year ago Type 2 diabetes mellitus with other specified complication, without long-term current use of insulin  [facility])   [facility] [facility] [place]. [facility] Delbert Clam, MD   1 year ago Controlled type 2 diabetes mellitus with hyperglycemia, unspecified whether long term insulin  use   [place] [facility] [place]. [facility] [place], [place], [place]   2 years ago Type 2 diabetes mellitus with other specified complication, with long-term current use of insulin  [facility])   [place] [facility] [place]. [facility] Delbert, Clam, MD              Passed - Last BP in normal range    BP Readings from Last 1 Encounters:  06/26/23  114/80          empagliflozin  (JARDIANCE ) 25 MG TABS tablet 30 tablet 0    Sig: Take 1 tablet (25 mg total) by mouth daily.     Endocrinology:  Diabetes - SGLT2 Inhibitors Failed - 02/21/2024  9:07 AM      Failed - Cr in normal range and within 360 days    Creat  Date Value Ref Range Status  12/22/2015 1.12 0.60 - 1.35 mg/dL Final   Creatinine, Ser  Date Value Ref Range Status  01/31/2023 1.23 0.76 - 1.27 mg/dL Final   Creatinine, Urine  Date Value Ref Range Status  12/22/2015 215 20 - 370 mg/dL Final         Failed - HBA1C is between 0 and 7.9 and within 180 days    HbA1c, POC (controlled diabetic range)  Date Value Ref Range Status  05/07/2023 7.0 0.0 - 7.0 % Final         Failed - eGFR in normal range and within 360 days    GFR, Est African American  Date Value Ref Range Status  12/22/2015 >89 >=60 mL/min Final   GFR calc Af Amer  Date Value Ref Range Status  03/22/2020 81 >59 mL/min/1.73 Final    Comment:    **  In accordance with recommendations from the NKF-ASN Task force,**   [facility] is in the process of updating its eGFR calculation to the   2021 CKD-EPI creatinine equation that estimates kidney function   without a race variable.    GFR, Est Non African American  Date Value Ref Range Status  12/22/2015 78 >=60 mL/min Final   GFR calc non Af Amer  Date Value Ref Range Status  03/22/2020 70 >59 mL/min/1.73 Final   eGFR  Date Value Ref Range Status  01/31/2023 70 >59 mL/min/1.73 Final         Failed - Valid encounter within last 6 months    Recent Outpatient Visits           9 months ago Type 2 diabetes mellitus with other specified complication, without long-term current use of insulin  (HCC)   [place] [facility] [place]. [facility] Delbert Clam, MD   1 year ago Type 2 diabetes mellitus with other specified complication, without long-term current use of insulin  [facility])   [facility] [facility]  [place]. [facility] Delbert Clam, MD   1 year ago Type 2 diabetes mellitus with other specified complication, without long-term current use of insulin  [facility])   [place] [facility] [place]. [facility] Delbert Clam, MD   1 year ago Controlled type 2 diabetes mellitus with hyperglycemia, unspecified whether long term insulin  use   [place] [facility] [place]. [facility] [place], [place], [place]   2 years ago Type 2 diabetes mellitus with other specified complication, with long-term current use of insulin  [facility])   [facility] [place]. [facility] Delbert, Clam, MD               metFORMIN  (GLUCOPHAGE ) 500 MG tablet 120 tablet 0     Endocrinology:  Diabetes - Biguanides Failed - 02/21/2024  9:07 AM      Failed - Cr in normal range and within 360 days    Creat  Date Value Ref Range Status  12/22/2015 1.12 0.60 - 1.35 mg/dL Final   Creatinine, Ser  Date Value Ref Range Status  01/31/2023 1.23 0.76 - 1.27 mg/dL Final   Creatinine, Urine  Date Value Ref Range Status  12/22/2015 215 20 - 370 mg/dL Final         Failed - HBA1C is between 0 and 7.9 and within 180 days    HbA1c, POC (controlled diabetic range)  Date Value Ref Range Status  05/07/2023 7.0 0.0 - 7.0 % Final         Failed - eGFR in normal range and within 360 days    GFR, Est African American  Date Value Ref Range Status  12/22/2015 >89 >=60 mL/min Final   GFR calc Af Amer  Date Value Ref Range Status  03/22/2020 81 >59 mL/min/1.73 Final    Comment:    **In accordance with recommendations from the NKF-ASN Task force,**   [facility] is in the process of updating its eGFR calculation to the   2021 CKD-EPI creatinine equation that estimates kidney function   without a race variable.    GFR, Est Non African American  Date Value Ref Range Status  12/22/2015 78 >=60 mL/min Final   GFR calc  non Af Amer  Date Value Ref Range  Status  03/22/2020 70 >59 mL/min/1.73 Final   eGFR  Date Value Ref Range Status  01/31/2023 70 >59 mL/min/1.73 Final         Failed - B12 Level in normal range and within 720 days    No results found for: VITAMINB12       Failed - Valid encounter within last 6 months    Recent Outpatient Visits           9 months ago Type 2 diabetes mellitus with other specified complication, without long-term current use of insulin  (HCC)   [place] [facility] [place]. [facility] Delbert Clam, MD   1 year ago Type 2 diabetes mellitus with other specified complication, without long-term current use of insulin  [facility])   [facility] [facility] [place]. [facility] Delbert Clam, MD   1 year ago Type 2 diabetes mellitus with other specified complication, without long-term current use of insulin  [facility])   [place] [facility] Of [place]. [facility] Delbert Clam, MD   1 year ago Controlled type 2 diabetes mellitus with hyperglycemia, unspecified whether long term insulin  use   [place] [place] [facility] [place]. [facility] [place], [place], [place]   2 years ago Type 2 diabetes mellitus with other specified complication, with long-term current use of insulin  [facility])   [place] [facility] . [facility] Delbert Clam, MD              Failed - CBC within normal limits and completed in the last 12 months    WBC  Date Value Ref Range Status  04/18/2022 7.8 3.4 - 10.8 x10E3/uL Final  12/22/2015 6.6 3.8 - 10.8 K/uL Final   RBC  Date Value Ref Range Status  04/18/2022 5.77 4.14 - 5.80 x10E6/uL Final  12/22/2015 5.88 (H) 4.20 - 5.80 MIL/uL Final   Hemoglobin  Date Value Ref Range Status  04/18/2022 15.5 13.0 - 17.7 g/dL Final   Hematocrit  Date Value Ref Range Status  04/18/2022 46.0 37.5 -  51.0 % Final   MCHC  Date Value Ref Range Status  04/18/2022 33.7 31.5 - 35.7 g/dL Final  88/97/7982 65.6 32.0 - 36.0 g/dL Final   [facility]  Date Value Ref Range Status  04/18/2022 26.9 26.6 - 33.0 pg Final  12/22/2015 27.2 27.0 - 33.0 pg Final   MCV  Date Value Ref Range Status  04/18/2022 80 79 - 97 fL Final   No results found for: PLTCOUNTKUC, LABPLAT, POCPLA RDW  Date Value Ref Range Status  04/18/2022 14.8 11.6 - 15.4 % Final

## 2024-03-17 ENCOUNTER — Telehealth: Payer: Self-pay | Admitting: Family Medicine

## 2024-03-17 NOTE — Telephone Encounter (Signed)
 Contacted pt left vm to confirmed appt

## 2024-03-18 ENCOUNTER — Other Ambulatory Visit: Payer: Self-pay | Admitting: Family Medicine

## 2024-03-18 ENCOUNTER — Ambulatory Visit: Attending: Family Medicine | Admitting: Family Medicine

## 2024-03-18 ENCOUNTER — Encounter: Payer: Self-pay | Admitting: Family Medicine

## 2024-03-18 VITALS — BP 128/77 | HR 104 | Temp 98.7°F | Ht 72.0 in | Wt 275.6 lb

## 2024-03-18 DIAGNOSIS — I152 Hypertension secondary to endocrine disorders: Secondary | ICD-10-CM | POA: Diagnosis not present

## 2024-03-18 DIAGNOSIS — Z7985 Long-term (current) use of injectable non-insulin antidiabetic drugs: Secondary | ICD-10-CM | POA: Diagnosis not present

## 2024-03-18 DIAGNOSIS — E1149 Type 2 diabetes mellitus with other diabetic neurological complication: Secondary | ICD-10-CM | POA: Diagnosis not present

## 2024-03-18 DIAGNOSIS — E1159 Type 2 diabetes mellitus with other circulatory complications: Secondary | ICD-10-CM | POA: Diagnosis not present

## 2024-03-18 DIAGNOSIS — Z125 Encounter for screening for malignant neoplasm of prostate: Secondary | ICD-10-CM | POA: Diagnosis not present

## 2024-03-18 DIAGNOSIS — Z7984 Long term (current) use of oral hypoglycemic drugs: Secondary | ICD-10-CM | POA: Diagnosis not present

## 2024-03-18 DIAGNOSIS — E785 Hyperlipidemia, unspecified: Secondary | ICD-10-CM

## 2024-03-18 DIAGNOSIS — E1169 Type 2 diabetes mellitus with other specified complication: Secondary | ICD-10-CM

## 2024-03-18 LAB — POCT GLYCOSYLATED HEMOGLOBIN (HGB A1C): HbA1c, POC (controlled diabetic range): 7.2 % — AB (ref 0.0–7.0)

## 2024-03-18 MED ORDER — GLIPIZIDE ER 10 MG PO TB24: 10.0000 mg | ORAL_TABLET | Freq: Every day | ORAL | 1 refills | Status: AC

## 2024-03-18 MED ORDER — METFORMIN HCL 500 MG PO TABS: 1000.0000 mg | ORAL_TABLET | Freq: Two times a day (BID) | ORAL | 1 refills | Status: DC

## 2024-03-18 MED ORDER — OZEMPIC (2 MG/DOSE) 8 MG/3ML ~~LOC~~ SOPN: PEN_INJECTOR | SUBCUTANEOUS | 1 refills | Status: AC

## 2024-03-18 MED ORDER — AMLODIPINE BESYLATE 10 MG PO TABS: 10.0000 mg | ORAL_TABLET | Freq: Every day | ORAL | 0 refills | Status: DC

## 2024-03-18 MED ORDER — LISINOPRIL-HYDROCHLOROTHIAZIDE 20-12.5 MG PO TABS: 1.0000 | ORAL_TABLET | Freq: Every day | ORAL | 1 refills | Status: AC

## 2024-03-18 MED ORDER — ATORVASTATIN CALCIUM 40 MG PO TABS: 40.0000 mg | ORAL_TABLET | Freq: Every day | ORAL | 1 refills | Status: AC

## 2024-03-18 MED ORDER — GABAPENTIN 300 MG PO CAPS: ORAL_CAPSULE | ORAL | 6 refills | Status: AC

## 2024-03-18 MED ORDER — EMPAGLIFLOZIN 25 MG PO TABS: 25.0000 mg | ORAL_TABLET | Freq: Every day | ORAL | 0 refills | Status: DC

## 2024-03-18 NOTE — Progress Notes (Signed)
 "  Subjective:  Patient ID: Anthony Vincent, male    DOB: December 09, 1970  Age: 54 y.o. MRN: [id number]  CC: Medical Management of Chronic Issues (No question.)     Discussed the use of AI scribe software for clinical note transcription with the patient, who gave verbal consent to proceed.  History of Present Illness Anthony Vincent is a 54 year old male with a history of 2 diabetes mellitus, diabetic neuropathy, hypertension, hyperlipidemia  who presents for routine follow-up.  His recent A1c is 7.2, slightly higher than 7.0 but overall stable. He is on metformin , Ozempic , glipizide , and Jardiance  for diabetes. He takes amlodipine , lisinopril , and hydrochlorothiazide  for blood pressure, which has been excellent, and he is adherent. He takes gabapentin  for neuropathy. It causes drowsiness and he drinks more soda for caffeine. His gabapentin  dose was previously increased for uncontrolled neuropathy. He is due for a diabetic eye exam and plans to schedule with his ophthalmologist.  He had recent heel pain that has resolved. He has an upcoming podiatry visit for foot exam and toenail care.  He is adherent with his antihypertensive and Statin. He is not exercising regularly and plans to start using a treadmill at home.      Past Medical History:  Diagnosis Date   Allergy    seasonal allergies   Diabetes mellitus without complication (HCC)    on meds   Diabetic peripheral neuropathy (HCC)    on meds   Hyperlipidemia    on meds   Hypertension    on meds   Neuromuscular disorder [facility])     Past Surgical History:  Procedure Laterality Date   WISDOM TOOTH EXTRACTION      Family History  Problem Relation Age of Onset   Diabetes Mother    Diabetes Father    Colon cancer Neg Hx    Colon polyps Neg Hx    Esophageal cancer Neg Hx    Stomach cancer Neg Hx    Rectal cancer Neg Hx     Social History   Socioeconomic History   Marital status: Single    Spouse name: Not on file    Number of children: Not on file   Years of education: Not on file   Highest education level: Not on file  Occupational History   Not on file  Tobacco Use   Smoking status: Never   Smokeless tobacco: Never  Vaping Use   Vaping status: Never Used  Substance and Sexual Activity   Alcohol use: No   Drug use: No   Sexual activity: Not on file  Other Topics Concern   Not on file  Social History Narrative   Not on file   Social Drivers of Health   Tobacco Use: Low Risk (05/07/2023)   Patient History    Smoking Tobacco Use: Never    Smokeless Tobacco Use: Never    Passive Exposure: Not on file  Financial Resource Strain: Low Risk (01/31/2023)   Overall Financial Resource Strain (CARDIA)    Difficulty of Paying Living Expenses: Not hard at all  Food Insecurity: No Food Insecurity (01/31/2023)   Hunger Vital Sign    Worried About Running Out of Food in the Last Year: Never true    Ran Out of Food in the Last Year: Never true  Transportation Needs: No Transportation Needs (01/31/2023)   PRAPARE - Administrator, Civil Service (Medical): No    Lack of Transportation (Non-Medical): No  Physical Activity: Patient Declined (01/31/2023)  Exercise Vital Sign    Days of Exercise per Week: Patient declined    Minutes of Exercise per Session: Patient declined  Stress: No Stress Concern Present (01/31/2023)   [employer] of Occupational Health - Occupational Stress Questionnaire    Feeling of Stress : Not at all  Social Connections: Unknown (01/31/2023)   Social Connection and Isolation Panel    Frequency of Communication with Friends and Family: Twice a week    Frequency of Social Gatherings with Friends and Family: Twice a week    Attends Religious Services: Not on file    Active Member of Clubs or Organizations: Yes    Attends Banker Meetings: 1 to 4 times per year    Marital Status: Married  Depression (PHQ2-9): Low Risk (03/18/2024)   Depression  (PHQ2-9)    PHQ-2 Score: 1  Alcohol Screen: Low Risk (01/31/2023)   Alcohol Screen    Last Alcohol Screening Score (AUDIT): 0  Housing: Unknown (01/31/2023)   Housing Stability Vital Sign    Unable to Pay for Housing in the Last Year: No    Number of Times Moved in the Last Year: Not on file    Homeless in the Last Year: No  Utilities: Not At Risk (01/31/2023)   AHC Utilities    Threatened with loss of utilities: No  Health Literacy: Adequate Health Literacy (01/31/2023)   B1300 Health Literacy    Frequency of need for help with medical instructions: Never    Allergies[1]  Outpatient Medications Prior to Visit  Medication Sig Dispense Refill   Blood Pressure Monitoring (BLOOD PRESSURE CUFF) MISC 1 each by Does not apply route daily as needed. 1 each 0   Insulin  Pen Needle (BD PEN NEEDLE NANO 2ND GEN) 32G X 4 MM MISC USE DAILY WITH BASAGLAR  100 each 1   amLODipine  (NORVASC ) 10 MG tablet Take 1 tablet (10 mg total) by mouth daily. 30 tablet 0   atorvastatin  (LIPITOR) 40 MG tablet Take 1 tablet (40 mg total) by mouth daily. 90 tablet 1   empagliflozin  (JARDIANCE ) 25 MG TABS tablet Take 1 tablet (25 mg total) by mouth daily. 30 tablet 0   gabapentin  (NEURONTIN ) 300 MG capsule TAKE 2 CAPSULES BY MOUTH EVERY MORNING AND TAKE 3 CAPSULES BY MOUTH EVERY EVENING 150 capsule 6   glipiZIDE  (GLUCOTROL  XL) 10 MG 24 hr tablet Take 1 tablet (10 mg total) by mouth daily with breakfast. 180 tablet 1   lisinopril -hydrochlorothiazide  (ZESTORETIC ) 20-12.5 MG tablet Take 1 tablet by mouth daily. 90 tablet 1   metFORMIN  (GLUCOPHAGE ) 500 MG tablet Take 2 tablets (1,000 mg total) by mouth 2 (two) times daily with a meal. 120 tablet 0   OZEMPIC , 2 MG/DOSE, 8 MG/3ML SOPN DIAL AND INJECT UNDER THE SKIN 2 MG WEEKLY 9 mL 1   Accu-Chek Softclix Lancets lancets Use as instructed (Patient not taking: Reported on 03/18/2024) 100 each 12   Blood Glucose Monitoring Suppl (ACCU-CHEK GUIDE) w/Device KIT Use as directed  tid (Patient not taking: Reported on 03/18/2024) 1 kit 0   glucose blood (ACCU-CHEK GUIDE TEST) test strip Use as instructed (Patient not taking: Reported on 03/18/2024) 100 each 12   terbinafine  (LAMISIL  AT) 1 % cream Apply 1 Application topically 2 (two) times daily. (Patient not taking: Reported on 03/18/2024) 42 g 1   cephALEXin  (KEFLEX ) 500 MG capsule Take 1 capsule (500 mg total) by mouth 2 (two) times daily. (Patient not taking: Reported on 03/18/2024) 14 capsule 0  diclofenac  (VOLTAREN ) 75 MG EC tablet Take 1 tablet (75 mg total) by mouth 2 (two) times daily as needed. (Patient not taking: Reported on 03/18/2024) 60 tablet 2   sildenafil  (VIAGRA ) 50 MG tablet Take 1 tablet (50 mg total) by mouth daily as needed for erectile dysfunction. (Patient not taking: Reported on 03/18/2024) 20 tablet 1   tiZANidine  (ZANAFLEX ) 4 MG tablet Take 1 tablet (4 mg total) by mouth every 8 (eight) hours as needed. (Patient not taking: Reported on 03/18/2024) 60 tablet 1   No facility-administered medications prior to visit.     ROS Review of Systems  Constitutional:  Negative for activity change and appetite change.  HENT:  Negative for sinus pressure and sore throat.   Respiratory:  Negative for chest tightness, shortness of breath and wheezing.   Cardiovascular:  Negative for chest pain and palpitations.  Gastrointestinal:  Negative for abdominal distention, abdominal pain and constipation.  Genitourinary: Negative.   Musculoskeletal: Negative.   Psychiatric/Behavioral:  Negative for behavioral problems and dysphoric mood.     Objective:  BP 128/77   Pulse (!) 104   Temp 98.7 F (37.1 C) (Oral)   Ht 6' (1.829 m)   Wt 275 lb 9.6 oz (125 kg)   SpO2 98%   BMI 37.38 kg/m      03/18/2024   10:51 AM 06/26/2023    8:45 AM 05/07/2023    9:12 AM  BP/Weight  Systolic BP 128 114 114  Diastolic BP 77 80 73  Wt. (Lbs) 275.6 276 276  BMI 37.38 kg/m2 37.43 kg/m2 37.43 kg/m2      Physical  Exam Constitutional:      Appearance: He is well-developed.  Cardiovascular:     Rate and Rhythm: Tachycardia present.     Heart sounds: Normal heart sounds. No murmur heard. Pulmonary:     Effort: Pulmonary effort is normal.     Breath sounds: Normal breath sounds. No wheezing or rales.  Chest:     Chest wall: No tenderness.  Abdominal:     General: Bowel sounds are normal. There is no distension.     Palpations: Abdomen is soft. There is no mass.     Tenderness: There is no abdominal tenderness.  Musculoskeletal:        General: Normal range of motion.     Right lower leg: No edema.     Left lower leg: No edema.  Neurological:     Mental Status: He is alert and oriented to person, place, and time.  Psychiatric:        Mood and Affect: Mood normal.        Latest Ref Rng & Units 01/31/2023    9:36 AM 04/18/2022    4:03 PM 08/03/2021    8:51 AM  CMP  Glucose 70 - 99 mg/dL 832  71  891   BUN 6 - 24 mg/dL 12  7  8    Creatinine 0.76 - 1.27 mg/dL 8.76  8.92  8.95   Sodium 134 - 144 mmol/L 140  142  145   Potassium 3.5 - 5.2 mmol/L 4.5  4.3  4.5   Chloride 96 - 106 mmol/L 101  102  105   CO2 20 - 29 mmol/L 20  24  21    Calcium  8.7 - 10.2 mg/dL 9.9  9.6  9.5   Total Protein 6.0 - 8.5 g/dL 7.4  7.4  7.3   Total Bilirubin 0.0 - 1.2 mg/dL 0.8  1.3  0.6  Alkaline Phos 44 - 121 IU/L 99  93  86   AST 0 - 40 IU/L 18  20  11    ALT 0 - 44 IU/L 34  30  22     Lipid Panel     Component Value Date/Time   CHOL 104 01/31/2023 0936   TRIG 114 01/31/2023 0936   HDL 27 (L) 01/31/2023 0936   CHOLHDL 3.2 04/18/2022 1603   CHOLHDL 4.3 12/22/2015 1207   VLDL 11 12/22/2015 1207   LDLCALC 56 01/31/2023 0936    CBC    Component Value Date/Time   WBC 7.8 04/18/2022 1603   WBC 6.6 12/22/2015 1207   RBC 5.77 04/18/2022 1603   RBC 5.88 (H) 12/22/2015 1207   HGB 15.5 04/18/2022 1603   HCT 46.0 04/18/2022 1603   PLT 246 04/18/2022 1603   MCV 80 04/18/2022 1603   MCH 26.9 04/18/2022  1603   MCH 27.2 12/22/2015 1207   MCHC 33.7 04/18/2022 1603   MCHC 34.3 12/22/2015 1207   RDW 14.8 04/18/2022 1603   LYMPHSABS 2.4 04/18/2022 1603   MONOABS 462 12/22/2015 1207   EOSABS 0.2 04/18/2022 1603   BASOSABS 0.1 04/18/2022 1603    Lab Results  Component Value Date   HGBA1C 7.2 (A) 03/18/2024   Lab Results  Component Value Date   HGBA1C 7.2 (A) 03/18/2024   HGBA1C 7.0 05/07/2023   HGBA1C 7.0 01/31/2023        Assessment & Plan 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 Gabapentin  causes drowsiness, increasing soda intake for caffeine. Discussed reducing gabapentin  or switching to duloxetine . - Continue metformin , Ozempic , glipizide , and Jardiance . - Adjust diet to improve A1c. - Consider reducing morning dose of gabapentin  to 1 capsule a day but if somnolence persists consider switching to duloxetine . - Scheduled podiatry appointment for foot exam. -Counseled on Diabetic diet, the healthy plate, 849 minutes of moderate intensity exercise/week Blood sugar logs with fasting goals of 80-120 mg/dl, random of less than 819 and in the event of sugars less than 60 mg/dl or greater than 599 mg/dl encouraged to notify the clinic. Advised on the need for annual eye exams, annual foot exams, Pneumonia vaccine.   Hypertension associated with type 2 diabetes mellitus Blood pressure well-controlled with amlodipine  and lisinopril -hydrochlorothiazide . - Continue amlodipine  and lisinopril -hydrochlorothiazide . -Counseled on blood pressure goal of less than 130/80, low-sodium, DASH diet, medication compliance, 150 minutes of moderate intensity exercise per week. Discussed medication compliance, adverse effects.   Hyperlipidemia associated with type 2 diabetes mellitus Lipid panel ordered to assess cholesterol levels. - Ordered lipid panel. - Continue statin  General Health Maintenance Due for eye exam. Discussed exercise  options. - Schedule eye exam with ophthalmologist. - Encouraged use of treadmill for exercise. Screening for prostate cancer-PSA ordered     Meds ordered this encounter  Medications   amLODipine  (NORVASC ) 10 MG tablet    Sig: Take 1 tablet (10 mg total) by mouth daily.    Dispense:  30 tablet    Refill:  0    Must have office visit for refills   atorvastatin  (LIPITOR) 40 MG tablet    Sig: Take 1 tablet (40 mg total) by mouth daily.    Dispense:  90 tablet    Refill:  1   empagliflozin  (JARDIANCE ) 25 MG TABS tablet    Sig: Take 1 tablet (25 mg total) by mouth daily.    Dispense:  30 tablet  Refill:  0    Must have office visit for refills   gabapentin  (NEURONTIN ) 300 MG capsule    Sig: TAKE 2 CAPSULES BY MOUTH EVERY MORNING AND TAKE 3 CAPSULES BY MOUTH EVERY EVENING    Dispense:  150 capsule    Refill:  6   glipiZIDE  (GLUCOTROL  XL) 10 MG 24 hr tablet    Sig: Take 1 tablet (10 mg total) by mouth daily with breakfast.    Dispense:  180 tablet    Refill:  1   lisinopril -hydrochlorothiazide  (ZESTORETIC ) 20-12.5 MG tablet    Sig: Take 1 tablet by mouth daily.    Dispense:  90 tablet    Refill:  1   metFORMIN  (GLUCOPHAGE ) 500 MG tablet    Sig: Take 2 tablets (1,000 mg total) by mouth 2 (two) times daily with a meal.    Dispense:  120 tablet    Refill:  1    Must have office visit for refills   Semaglutide , 2 MG/DOSE, (OZEMPIC , 2 MG/DOSE,) 8 MG/3ML SOPN    Sig: DIAL AND INJECT UNDER THE SKIN 2 MG WEEKLY    Dispense:  9 mL    Refill:  1    Follow-up: Return in about 6 months (around 09/15/2024).       Corrina Sabin, MD, FAAFP. [facility] [place], [place] [phone]   03/18/2024, 12:00 PM    [1] No Known Allergies  "

## 2024-03-18 NOTE — Patient Instructions (Signed)
 VISIT SUMMARY:  During your visit, we reviewed your diabetes, hypertension, and general health maintenance. Your A1c level is slightly above target, and we discussed options for managing your neuropathy. Your blood pressure is well-controlled, and we have ordered a lipid panel to assess your cholesterol levels. We also discussed the importance of regular exercise and scheduling your diabetic eye exam.  YOUR PLAN:  -TYPE 2 DIABETES MELLITUS COMPLICATED BY DIABETIC PERIPHERAL NEUROPATHY: Type 2 diabetes is a condition where your body does not use insulin  properly, leading to high blood sugar levels. Diabetic peripheral neuropathy is nerve damage caused by high blood sugar. Your A1c level is 7.2, which is slightly above the target. We discussed the possibility of reducing your morning dose of gabapentin  or switching to duloxetine  to manage your neuropathy and reduce drowsiness. Continue taking metformin , Ozempic , glipizide , and Jardiance . Adjust your diet to help improve your A1c level. You have a scheduled podiatry appointment for a foot exam.  -HYPERTENSION: Hypertension is high blood pressure, which can lead to serious health problems if not managed. Your blood pressure is well-controlled with your current medications, amlodipine  and lisinopril -hydrochlorothiazide . Continue taking these medications as prescribed.  -HYPERLIPIDEMIA: Hyperlipidemia is having high levels of fats (lipids) in your blood, which can increase your risk of heart disease. We have ordered a lipid panel to assess your cholesterol levels.  -GENERAL HEALTH MAINTENANCE: You are due for a diabetic eye exam, which is important for monitoring any changes in your vision related to diabetes. We also discussed the importance of regular exercise, and you are encouraged to use the treadmill at home to help improve your overall health.  INSTRUCTIONS:  Please schedule your diabetic eye exam with your ophthalmologist. Continue with your  current medications for diabetes and hypertension. Adjust your diet to help improve your A1c level. Consider reducing your morning dose of gabapentin  or switching to duloxetine  to manage your neuropathy. Use the treadmill at home for regular exercise. Follow up with your podiatry appointment for a foot exam. We have ordered a lipid panel to assess your cholesterol levels.

## 2024-03-19 ENCOUNTER — Ambulatory Visit: Payer: Self-pay | Admitting: Family Medicine

## 2024-03-19 LAB — CMP14+EGFR
ALT: 27 [IU]/L (ref 0–44)
AST: 16 [IU]/L (ref 0–40)
Albumin: 4.5 g/dL (ref 3.8–4.9)
Alkaline Phosphatase: 84 [IU]/L (ref 47–123)
BUN/Creatinine Ratio: 10 (ref 9–20)
BUN: 11 mg/dL (ref 6–24)
Bilirubin Total: 0.8 mg/dL (ref 0.0–1.2)
CO2: 24 mmol/L (ref 20–29)
Calcium: 9.5 mg/dL (ref 8.7–10.2)
Chloride: 102 mmol/L (ref 96–106)
Creatinine, Ser: 1.06 mg/dL (ref 0.76–1.27)
Globulin, Total: 2.6 g/dL (ref 1.5–4.5)
Glucose: 113 mg/dL — ABNORMAL HIGH (ref 70–99)
Potassium: 4.6 mmol/L (ref 3.5–5.2)
Sodium: 142 mmol/L (ref 134–144)
Total Protein: 7.1 g/dL (ref 6.0–8.5)
eGFR: 83 mL/min/{1.73_m2}

## 2024-03-19 LAB — MICROALBUMIN / CREATININE URINE RATIO
Creatinine, Urine: 126.7 mg/dL
Microalb/Creat Ratio: 14 mg/g{creat} (ref 0–29)
Microalbumin, Urine: 18.3 ug/mL

## 2024-03-19 LAB — LP+NON-HDL CHOLESTEROL
Cholesterol, Total: 102 mg/dL (ref 100–199)
HDL: 29 mg/dL — ABNORMAL LOW
LDL Chol Calc (NIH): 60 mg/dL (ref 0–99)
Total Non-HDL-Chol (LDL+VLDL): 73 mg/dL (ref 0–129)
Triglycerides: 54 mg/dL (ref 0–149)
VLDL Cholesterol Cal: 13 mg/dL (ref 5–40)

## 2024-03-20 LAB — SPECIMEN STATUS REPORT

## 2024-03-20 LAB — PSA: Prostate Specific Ag, Serum: 0.7 ng/mL (ref 0.0–4.0)

## 2024-03-22 ENCOUNTER — Other Ambulatory Visit: Payer: Self-pay | Admitting: Family Medicine

## 2024-03-22 DIAGNOSIS — E1169 Type 2 diabetes mellitus with other specified complication: Secondary | ICD-10-CM

## 2024-03-22 DIAGNOSIS — Z7984 Long term (current) use of oral hypoglycemic drugs: Secondary | ICD-10-CM

## 2024-03-22 DIAGNOSIS — E1159 Type 2 diabetes mellitus with other circulatory complications: Secondary | ICD-10-CM

## 2024-09-15 ENCOUNTER — Ambulatory Visit: Payer: Self-pay | Admitting: Family Medicine
